# Patient Record
Sex: Female | Born: 1935 | Race: White | Hispanic: Yes | Marital: Single | Smoking: Never smoker
Health system: Southern US, Community
[De-identification: ages and names within clinical notes are randomized; demographics above are authoritative.]

## PROBLEM LIST (undated history)

## (undated) DIAGNOSIS — Z8719 Personal history of other diseases of the digestive system: Secondary | ICD-10-CM

## (undated) DIAGNOSIS — H269 Unspecified cataract: Secondary | ICD-10-CM

## (undated) DIAGNOSIS — R51 Headache: Secondary | ICD-10-CM

## (undated) DIAGNOSIS — N289 Disorder of kidney and ureter, unspecified: Secondary | ICD-10-CM

## (undated) DIAGNOSIS — J189 Pneumonia, unspecified organism: Secondary | ICD-10-CM

## (undated) DIAGNOSIS — Z8489 Family history of other specified conditions: Secondary | ICD-10-CM

## (undated) DIAGNOSIS — I639 Cerebral infarction, unspecified: Secondary | ICD-10-CM

## (undated) DIAGNOSIS — J302 Other seasonal allergic rhinitis: Secondary | ICD-10-CM

## (undated) DIAGNOSIS — Z8711 Personal history of peptic ulcer disease: Secondary | ICD-10-CM

## (undated) DIAGNOSIS — N390 Urinary tract infection, site not specified: Secondary | ICD-10-CM

## (undated) DIAGNOSIS — E119 Type 2 diabetes mellitus without complications: Secondary | ICD-10-CM

## (undated) DIAGNOSIS — I10 Essential (primary) hypertension: Secondary | ICD-10-CM

## (undated) DIAGNOSIS — G4730 Sleep apnea, unspecified: Secondary | ICD-10-CM

## (undated) DIAGNOSIS — M1990 Unspecified osteoarthritis, unspecified site: Secondary | ICD-10-CM

## (undated) HISTORY — PX: KNEE SURGERY: SHX244

## (undated) HISTORY — PX: EYE SURGERY: SHX253

## (undated) HISTORY — PX: CHOLECYSTECTOMY: SHX55

## (undated) HISTORY — PX: ABDOMINAL HYSTERECTOMY: SHX81

## (undated) HISTORY — DX: Disorder of kidney and ureter, unspecified: N28.9

## (undated) HISTORY — PX: BLADDER SURGERY: SHX569

## (undated) HISTORY — PX: OVARIAN CYST REMOVAL: SHX89

## (undated) HISTORY — PX: TUBAL LIGATION: SHX77

## (undated) HISTORY — PX: APPENDECTOMY: SHX54

---

## 2007-02-10 ENCOUNTER — Emergency Department (HOSPITAL_COMMUNITY): Admission: EM | Admit: 2007-02-10 | Discharge: 2007-02-10 | Payer: Self-pay | Admitting: Emergency Medicine

## 2007-11-03 ENCOUNTER — Ambulatory Visit (HOSPITAL_COMMUNITY): Admission: RE | Admit: 2007-11-03 | Discharge: 2007-11-03 | Payer: Self-pay | Admitting: Internal Medicine

## 2007-12-21 ENCOUNTER — Encounter: Admission: RE | Admit: 2007-12-21 | Discharge: 2007-12-21 | Payer: Self-pay | Admitting: Internal Medicine

## 2008-03-29 ENCOUNTER — Encounter: Admission: RE | Admit: 2008-03-29 | Discharge: 2008-03-29 | Payer: Self-pay | Admitting: Internal Medicine

## 2008-10-18 ENCOUNTER — Encounter: Admission: RE | Admit: 2008-10-18 | Discharge: 2008-10-18 | Payer: Self-pay | Admitting: Orthopaedic Surgery

## 2008-10-24 ENCOUNTER — Ambulatory Visit (HOSPITAL_BASED_OUTPATIENT_CLINIC_OR_DEPARTMENT_OTHER): Admission: RE | Admit: 2008-10-24 | Discharge: 2008-10-24 | Payer: Self-pay | Admitting: Orthopaedic Surgery

## 2008-11-22 ENCOUNTER — Ambulatory Visit (HOSPITAL_COMMUNITY): Admission: RE | Admit: 2008-11-22 | Discharge: 2008-11-22 | Payer: Self-pay | Admitting: Internal Medicine

## 2009-12-27 ENCOUNTER — Ambulatory Visit (HOSPITAL_COMMUNITY): Admission: RE | Admit: 2009-12-27 | Discharge: 2009-12-27 | Payer: Self-pay | Admitting: Internal Medicine

## 2010-03-06 ENCOUNTER — Emergency Department (HOSPITAL_COMMUNITY): Admission: EM | Admit: 2010-03-06 | Discharge: 2010-03-06 | Payer: Self-pay | Admitting: Emergency Medicine

## 2010-10-04 LAB — GLUCOSE, CAPILLARY: Glucose-Capillary: 189 mg/dL — ABNORMAL HIGH (ref 70–99)

## 2010-10-30 LAB — GLUCOSE, CAPILLARY
Glucose-Capillary: 106 mg/dL — ABNORMAL HIGH (ref 70–99)
Glucose-Capillary: 107 mg/dL — ABNORMAL HIGH (ref 70–99)

## 2010-10-30 LAB — POCT HEMOGLOBIN-HEMACUE: Hemoglobin: 12.1 g/dL (ref 12.0–15.0)

## 2010-10-31 LAB — BASIC METABOLIC PANEL
BUN: 19 mg/dL (ref 6–23)
CO2: 25 mEq/L (ref 19–32)
Calcium: 9.7 mg/dL (ref 8.4–10.5)
Chloride: 105 mEq/L (ref 96–112)
Creatinine, Ser: 0.82 mg/dL (ref 0.4–1.2)
GFR calc Af Amer: >60 mL/min (ref >60)
GFR calc non Af Amer: >60 mL/min (ref >60)
Glucose, Bld: 111 mg/dL — ABNORMAL HIGH (ref 70–99)
Potassium: 4.7 mEq/L (ref 3.5–5.1)
Sodium: 139 mEq/L (ref 135–145)

## 2010-12-03 NOTE — Op Note (Signed)
Casey Liu, Casey Liu      ACCOUNT NO.:  [id number]   MEDICAL RECORD NO.:  [id number]          PATIENT TYPE:  AMB   LOCATION:  DSC                          FACILITY:  [facility]   PHYSICIAN:  Lubertha Basque. Dalldorf, M.D.DATE OF BIRTH:  1934-12-16   DATE OF PROCEDURE:  10/24/2008  DATE OF DISCHARGE:                               OPERATIVE REPORT   PREOPERATIVE DIAGNOSES:  1. Left knee torn medial and torn lateral meniscus.  2. Left knee chondromalacia.   POSTOPERATIVE DIAGNOSES:  1. Left knee torn medial and torn lateral meniscus.  2. Left knee chondromalacia.   PROCEDURES:  1. Left knee partial medial meniscectomy and partial lateral      meniscectomy.  2. Left knee abrasion chondroplasty, patellofemoral.   ANESTHESIA:  General.   ATTENDING SURGEON:  Lubertha Basque. Dalldorf, MD   INDICATIONS FOR PROCEDURE:  The patient is a 76 year old woman with long  history of bilateral knee pain.  On the right, she is known to have  advanced degenerative change by x-ray, but on the left the change  appears mild at worse.  She has failed injections of cortisone and  viscosupplementative agents on the left as well as oral anti-  inflammatories.  She has pain which limits her ability to rest and walk  and she is offered an arthroscopy.  Informed operative consent was  obtained after discussion of possible complications including reaction  to anesthesia and infection.   SUMMARY/FINDINGS/PROCEDURE:  Under general anesthesia, an arthroscopy of  the left knee was performed.  Suprapatellar pouch was benign while the  patellofemoral joint exhibited grade 3 and focal grade 4 change.  An  abrasion chondroplasty was done in some small areas and thorough  chondroplasty was done across broad areas.  Medial compartment exhibited  torn medial meniscus as expected and a partial medial meniscectomy was  done with basket and shaver removing about 10% of this structure back to  a stable rim.  Unfortunately, she  had a one-half dime sized area of bare  bone on the tibial plateau directly below this torn piece of meniscus.  The ACL was intact and the lateral compartment exhibited a degenerative  tear of the central portion addressed with a tiny partial lateral  meniscectomy.  There were minimal degenerative changes laterally.   DESCRIPTION OF PROCEDURE:  The patient was taken to the operating suite  where general anesthetic was applied without difficulty.  She was  positioned supine and prepped and draped in normal sterile fashion.  After the administration of IV Kefzol, an arthroscopy of the left knee  was performed through a total of two portals.  Findings were as noted  above and procedure consisted of a partial medial and partial lateral  meniscectomy done with basket and shaver.  We then performed the  abrasion chondroplasty patellofemoral.  The knee was thoroughly  irrigated followed by placement of Marcaine with epinephrine and  morphine plus Depo-Medrol.  Adaptic was placed over the portals followed  by dry gauze and loose Ace wrap.  Estimated blood loss and  intraoperative fluids can be obtained from anesthesia records.   DISPOSITION:  The patient was extubated in the  operating room and taken  to the recovery room in stable addition.  She was to go home same-day  and follow up in the office closely.  I will contact her by phone  tonight.      Lubertha Basque Jerl Santos, M.D.  Electronically Signed     PGD/MEDQ  D:  10/24/2008  T:  10/25/2008  Job:  [id number]

## 2011-01-20 ENCOUNTER — Other Ambulatory Visit (HOSPITAL_COMMUNITY): Payer: Self-pay | Admitting: Internal Medicine

## 2011-01-20 DIAGNOSIS — Z1231 Encounter for screening mammogram for malignant neoplasm of breast: Secondary | ICD-10-CM

## 2011-01-29 ENCOUNTER — Ambulatory Visit (HOSPITAL_COMMUNITY)
Admission: RE | Admit: 2011-01-29 | Discharge: 2011-01-29 | Disposition: A | Discharge status: Home / Self Care | Payer: Medicare Other | Source: Ambulatory Visit | Attending: Internal Medicine | Admitting: Internal Medicine

## 2011-01-29 DIAGNOSIS — Z1231 Encounter for screening mammogram for malignant neoplasm of breast: Secondary | ICD-10-CM | POA: Insufficient documentation

## 2011-05-05 LAB — I-STAT 8, (EC8 V) (CONVERTED LAB)
Acid-base deficit: 1
BUN: 16
Bicarbonate: 24
Chloride: 108
Glucose, Bld: 96
HCT: 42
Hemoglobin: 14.3
Operator id: 285491
Potassium: 4.3
Sodium: 139
TCO2: 25
pCO2, Ven: 39 — ABNORMAL LOW
pH, Ven: 7.398 — ABNORMAL HIGH

## 2011-05-05 LAB — BASIC METABOLIC PANEL
BUN: 12
CO2: 26
Calcium: 9.3
Chloride: 104
Creatinine, Ser: 0.7
GFR calc Af Amer: >60
GFR calc non Af Amer: >60
Glucose, Bld: 91
Potassium: 4.1
Sodium: 138

## 2011-05-05 LAB — POCT CARDIAC MARKERS
CKMB, poc: <1 — ABNORMAL LOW
CKMB, poc: <1 — ABNORMAL LOW
CKMB, poc: <1 — ABNORMAL LOW
Myoglobin, poc: 41.2
Myoglobin, poc: 50.5
Myoglobin, poc: 54.9
Operator id: 285491
Operator id: 285841
Operator id: 285841
Troponin i, poc: <0.05
Troponin i, poc: <0.05
Troponin i, poc: <0.05

## 2011-05-05 LAB — POCT I-STAT CREATININE
Creatinine, Ser: 0.7
Operator id: 285491

## 2011-05-05 LAB — D-DIMER, QUANTITATIVE: D-Dimer, Quant: 0.24

## 2012-03-03 ENCOUNTER — Other Ambulatory Visit (HOSPITAL_COMMUNITY): Payer: Self-pay | Admitting: Internal Medicine

## 2012-03-03 DIAGNOSIS — Z1231 Encounter for screening mammogram for malignant neoplasm of breast: Secondary | ICD-10-CM

## 2012-03-19 ENCOUNTER — Ambulatory Visit (HOSPITAL_COMMUNITY)
Admission: RE | Admit: 2012-03-19 | Discharge: 2012-03-19 | Disposition: A | Discharge status: Home / Self Care | Payer: Medicare Other | Source: Ambulatory Visit | Attending: Internal Medicine | Admitting: Internal Medicine

## 2012-03-19 DIAGNOSIS — Z1231 Encounter for screening mammogram for malignant neoplasm of breast: Secondary | ICD-10-CM | POA: Insufficient documentation

## 2012-05-19 ENCOUNTER — Observation Stay (HOSPITAL_COMMUNITY): Payer: Medicare Other

## 2012-05-19 ENCOUNTER — Encounter (HOSPITAL_COMMUNITY): Payer: Self-pay

## 2012-05-19 ENCOUNTER — Observation Stay (HOSPITAL_COMMUNITY)
Admission: EM | Admit: 2012-05-19 | Discharge: 2012-05-19 | Disposition: A | Discharge status: Home / Self Care | Payer: Medicare Other | Attending: Emergency Medicine | Admitting: Emergency Medicine

## 2012-05-19 DIAGNOSIS — R1012 Left upper quadrant pain: Principal | ICD-10-CM | POA: Insufficient documentation

## 2012-05-19 DIAGNOSIS — Z79899 Other long term (current) drug therapy: Secondary | ICD-10-CM | POA: Insufficient documentation

## 2012-05-19 DIAGNOSIS — R109 Unspecified abdominal pain: Secondary | ICD-10-CM

## 2012-05-19 DIAGNOSIS — R112 Nausea with vomiting, unspecified: Secondary | ICD-10-CM | POA: Insufficient documentation

## 2012-05-19 DIAGNOSIS — N390 Urinary tract infection, site not specified: Secondary | ICD-10-CM

## 2012-05-19 DIAGNOSIS — R197 Diarrhea, unspecified: Secondary | ICD-10-CM | POA: Insufficient documentation

## 2012-05-19 LAB — CBC WITH DIFFERENTIAL/PLATELET
Basophils Absolute: 0 10*3/uL (ref 0.0–0.1)
Basophils Relative: 1 % (ref 0–1)
Eosinophils Absolute: 0.1 10*3/uL (ref 0.0–0.7)
Eosinophils Relative: 2 % (ref 0–5)
HCT: 42.2 % (ref 36.0–46.0)
Hemoglobin: 13.5 g/dL (ref 12.0–15.0)
Lymphocytes Relative: 33 % (ref 12–46)
Lymphs Abs: 2.6 10*3/uL (ref 0.7–4.0)
MCH: 28.1 pg (ref 26.0–34.0)
MCHC: 32 g/dL (ref 30.0–36.0)
MCV: 87.9 fL (ref 78.0–100.0)
Monocytes Absolute: 0.7 10*3/uL (ref 0.1–1.0)
Monocytes Relative: 9 % (ref 3–12)
Neutro Abs: 4.3 10*3/uL (ref 1.7–7.7)
Neutrophils Relative %: 56 % (ref 43–77)
Platelets: 263 10*3/uL (ref 150–400)
RBC: 4.8 MIL/uL (ref 3.87–5.11)
RDW: 14.5 % (ref 11.5–15.5)
WBC: 7.8 10*3/uL (ref 4.0–10.5)

## 2012-05-19 LAB — POCT I-STAT TROPONIN I: Troponin i, poc: 0 ng/mL (ref 0.00–0.08)

## 2012-05-19 LAB — COMPREHENSIVE METABOLIC PANEL
ALT: 7 U/L (ref 0–35)
AST: 14 U/L (ref 0–37)
Albumin: 3.6 g/dL (ref 3.5–5.2)
Alkaline Phosphatase: 106 U/L (ref 39–117)
BUN: 17 mg/dL (ref 6–23)
CO2: 24 mEq/L (ref 19–32)
Calcium: 9.7 mg/dL (ref 8.4–10.5)
Chloride: 107 mEq/L (ref 96–112)
Creatinine, Ser: 0.79 mg/dL (ref 0.50–1.10)
GFR calc Af Amer: >90 mL/min (ref >90)
GFR calc non Af Amer: 79 mL/min — ABNORMAL LOW (ref >90)
Glucose, Bld: 108 mg/dL — ABNORMAL HIGH (ref 70–99)
Potassium: 4.1 mEq/L (ref 3.5–5.1)
Sodium: 141 mEq/L (ref 135–145)
Total Bilirubin: 0.3 mg/dL (ref 0.3–1.2)
Total Protein: 7.9 g/dL (ref 6.0–8.3)

## 2012-05-19 LAB — URINE MICROSCOPIC-ADD ON

## 2012-05-19 LAB — URINALYSIS, ROUTINE W REFLEX MICROSCOPIC
Glucose, UA: NEGATIVE mg/dL
Hgb urine dipstick: NEGATIVE
Ketones, ur: NEGATIVE mg/dL
Nitrite: NEGATIVE
Protein, ur: 30 mg/dL — AB
Specific Gravity, Urine: 1.03 (ref 1.005–1.030)
Urobilinogen, UA: 0.2 mg/dL (ref 0.0–1.0)
pH: 5.5 (ref 5.0–8.0)

## 2012-05-19 LAB — LACTIC ACID, PLASMA: Lactic Acid, Venous: 1.4 mmol/L (ref 0.5–2.2)

## 2012-05-19 LAB — LIPASE, BLOOD: Lipase: 17 U/L (ref 11–59)

## 2012-05-19 MED ORDER — ONDANSETRON HCL 4 MG/2ML IJ SOLN
4.0000 mg | Freq: Once | INTRAMUSCULAR | Status: AC
  Administered 2012-05-19: 4 mg via INTRAVENOUS
  Filled 2012-05-19: qty 2

## 2012-05-19 MED ORDER — IOHEXOL 300 MG/ML  SOLN
80.0000 mL | Freq: Once | INTRAMUSCULAR | Status: AC | PRN
  Administered 2012-05-19: 80 mL via INTRAVENOUS

## 2012-05-19 MED ORDER — LIDOCAINE HCL (PF) 1 % IJ SOLN
INTRAMUSCULAR | Status: AC
  Administered 2012-05-19: 2 mL
  Filled 2012-05-19: qty 5

## 2012-05-19 MED ORDER — CEFTRIAXONE SODIUM 1 G IJ SOLR
1.0000 g | Freq: Once | INTRAMUSCULAR | Status: AC
  Administered 2012-05-19: 1 g via INTRAMUSCULAR
  Filled 2012-05-19: qty 10

## 2012-05-19 MED ORDER — MORPHINE SULFATE 4 MG/ML IJ SOLN
4.0000 mg | Freq: Once | INTRAMUSCULAR | Status: AC
  Administered 2012-05-19: 4 mg via INTRAVENOUS
  Filled 2012-05-19: qty 1

## 2012-05-19 MED ORDER — CEPHALEXIN 500 MG PO CAPS: 500.0000 mg | ORAL_CAPSULE | Freq: Four times a day (QID) | ORAL | Status: DC

## 2012-05-19 MED ORDER — IOHEXOL 300 MG/ML  SOLN
20.0000 mL | INTRAMUSCULAR | Status: AC
  Administered 2012-05-19 (×2): 20 mL via ORAL

## 2012-05-19 MED ORDER — SODIUM CHLORIDE 0.9 % IV BOLUS (SEPSIS)
500.0000 mL | Freq: Once | INTRAVENOUS | Status: AC
  Administered 2012-05-19: 12:00:00 via INTRAVENOUS

## 2012-05-19 NOTE — ED Notes (Signed)
Patient has provided urine sample if needed. 

## 2012-05-19 NOTE — ED Provider Notes (Signed)
Medical screening examination/treatment/procedure(s) were performed by non-physician practitioner and as supervising physician I was immediately available for consultation/collaboration.  Magdalena Skilton R. Ilana Prezioso, MD 05/19/12 1640 

## 2012-05-19 NOTE — ED Notes (Signed)
Pt c/o midsternal CP 7/10, reports she has been having it on and off for a few days, pt thought it was her bra, she took it off and was still experiencing CP. Cyndia Skeeters., PA notified, new EKG done.

## 2012-05-19 NOTE — ED Provider Notes (Signed)
Medical screening examination/treatment/procedure(s) were performed by non-physician practitioner and as supervising physician I was immediately available for consultation/collaboration.  Daisuke Bailey R. Galan Ghee, MD 05/19/12 1640 

## 2012-05-19 NOTE — ED Provider Notes (Signed)
Patient placed in the CDU for complaints of abdominal pain with a CT scan pending. Patient urinalysis with evidence of urinary tract infection. Other Labs unremarkable. Patient states she feels much better and her pain is almost gone.   On exam: hemodynamically stable, NAD, heart w/ RRR, lungs CTAB, mild abdominal tenderness in the left upper quadrant, no peripheral edema or calf tenderness.  Patient now complaining of chest pain. ECG remains nonischemic. I-STAT troponin drawn.  On exam: She remained hemodynamic stable no apparent distress heart with regular rate and rhythm, lungs clear to auscultation bilaterally.    ECG:  Date: 05/19/2012  Rate: 67  Rhythm: normal sinus rhythm  QRS Axis: normal  Intervals: normal  ST/T Wave abnormalities: nonspecific ST changes  Conduction Disutrbances:none  Narrative Interpretation: T wave flattening in V2-V6; non-ischemic ECG  Old EKG Reviewed: unchanged  Reevaluation patient resting comfortably. She states she feels much better. Her chest pain is resolved. Her abdominal pain is rated at 3/10.  Abdominal CT scan pending.  Dahlia Client Usbaldo Pannone, PA-C 05/19/12 1524

## 2012-05-19 NOTE — ED Provider Notes (Signed)
History     CSN: [id number]  Arrival date & time 05/19/12  1020   First MD Initiated Contact with Patient 05/19/12 1040      Chief Complaint  Patient presents with  . Abdominal Pain    (Consider location/radiation/quality/duration/timing/severity/associated sxs/prior treatment) HPI  Casey Liu is a 77 y.o. female accompanied by her daughter who translates complaining of 2 weeks of colicky left upper quadrant pain associated with nausea and 2 episodes of nonbloody nonbilious vomiting one last Sunday and one yesterday. Patient denies fever, diarrhea, dysuria. Patient has appendectomy and cholecystectomy previously.   No past medical history on file.  No past surgical history on file.  No family history on file.  History  Substance Use Topics  . Smoking status: Not on file  . Smokeless tobacco: Not on file  . Alcohol Use: Not on file    OB History    Grav Para Term Preterm Abortions TAB SAB Ect Mult Living                  Review of Systems  Constitutional: Negative for fever.  Respiratory: Negative for shortness of breath.   Cardiovascular: Negative for chest pain.  Gastrointestinal: Positive for vomiting, abdominal pain and diarrhea. Negative for nausea.  All other systems reviewed and are negative.    Allergies  Celebrex and Latex  Home Medications   Current Outpatient Rx  Name Route Sig Dispense Refill  . ATENOLOL 100 MG PO TABS Oral Take 100 mg by mouth daily.    Marland Kitchen FLUTICASONE PROPIONATE 50 MCG/ACT NA SUSP Nasal Place 2 sprays into the nose daily.    Marland Kitchen LISINOPRIL-HYDROCHLOROTHIAZIDE 20-12.5 MG PO TABS Oral Take 1 tablet by mouth daily.    Marland Kitchen METFORMIN HCL 500 MG PO TABS Oral Take 500 mg by mouth 2 (two) times daily with a meal.    . NIFEDIPINE ER 90 MG PO TB24 Oral Take 90 mg by mouth daily.    Frazier Butt ULTRA OP Both Eyes Place 1 drop into both eyes every hour as needed. For dry eyes.    . TRAMADOL HCL 50 MG PO TABS Oral Take 50 mg by mouth  every 6 (six) hours as needed. For pain.      BP 155/70  Pulse 64  Temp 97.5 F (36.4 C) (Oral)  Resp 18  SpO2 95%  Physical Exam  Nursing note and vitals reviewed. Constitutional: She is oriented to person, place, and time. She appears well-developed and well-nourished. No distress.  HENT:  Head: Normocephalic.  Mouth/Throat: Oropharynx is clear and moist.  Eyes: Conjunctivae normal and EOM are normal.  Cardiovascular: Normal rate.   Pulmonary/Chest: Effort normal. No stridor.  Abdominal: Soft. Bowel sounds are normal. She exhibits no distension and no mass. There is tenderness. There is no rebound and no guarding.       Tenderness to light palpation of left upper quadrant with no guarding or rebound.  Musculoskeletal: Normal range of motion.  Neurological: She is alert and oriented to person, place, and time.  Skin: Skin is warm.  Psychiatric: She has a normal mood and affect.    ED Course  Procedures (including critical care time)  Labs Reviewed  COMPREHENSIVE METABOLIC PANEL - Abnormal; Notable for the following:    Glucose, Bld 108 (*)     GFR calc non Af Amer 79 (*)     All other components within normal limits  URINALYSIS, ROUTINE W REFLEX MICROSCOPIC - Abnormal; Notable for the following:  Color, Urine AMBER (*)  BIOCHEMICALS MAY BE AFFECTED BY COLOR   APPearance HAZY (*)     Bilirubin Urine SMALL (*)     Protein, ur 30 (*)     Leukocytes, UA MODERATE (*)     All other components within normal limits  URINE MICROSCOPIC-ADD ON - Abnormal; Notable for the following:    Crystals CA OXALATE CRYSTALS (*)     All other components within normal limits  CBC WITH DIFFERENTIAL  LIPASE, BLOOD  LACTIC ACID, PLASMA  URINE CULTURE   No results found.   1. UTI (lower urinary tract infection)       MDM   Mild epigastric and left upper quadrant abdominal pain for 2 weeks with 2 episodes of vomiting. Abdominal exam is benign with mild tenderness to palpation of the  epigastrium no rebound or guarding blood work is normal and lactic acid is unremarkable. Urinalysis shows moderate leukocytes I will send a urine culture and give her a gram of Rocephin via IV. CT abdomen pelvis is pending.  Patient moved to CDU case signed out to PA Muthersbaugh. Plan is to DC if CT is negative with Keflex for the UTI and pain and nausea control medication with close followup by her primary care Dr. in the next 24-48 hours.Joni Reining Easter Kennebrew, PA-C 05/19/12 1313

## 2012-05-19 NOTE — ED Notes (Signed)
Pt has returned from ct 

## 2012-05-19 NOTE — ED Notes (Signed)
Pt here for abd pain, onset yesterday and Sunday with some vomiting, pain increases with palpation.

## 2012-05-19 NOTE — ED Notes (Signed)
Family at bedside. 

## 2012-05-19 NOTE — ED Provider Notes (Signed)
77 year old female in the CDU for abdominal pain. She was awaiting a CT scan which did not show any acute reason for her left upper quadrant abdominal pain. I discussed CT results with patient and her daughter and son-in-law who are translating for her. They state their understanding. Patient currently states her pain is 2/10 and she is feeling much better. Her labs are unremarkable. I will treat her urinary tract infection with Keflex. On exam she is resting comfortably in bed in no apparent distress. Heart RRR. Lungs CTA. Abdomen is soft with normal bowel sounds. Very mild tenderness in left upper quadrant without guarding or any peritoneal signs. AAO x3. She is stable for discharge. I advised followup with her primary care physician in one week. Patient, daughter and son-in-law state their understanding of plan and are agreeable.  Ct Abdomen Pelvis W Contrast  05/19/2012  *RADIOLOGY REPORT*  Clinical Data: Left upper quadrant pain.  History partial hysterectomy.  CT ABDOMEN AND PELVIS WITH CONTRAST  Technique:  Multidetector CT imaging of the abdomen and pelvis was performed following the standard protocol during bolus administration of intravenous contrast.  Contrast: 80mL OMNIPAQUE IOHEXOL 300 MG/ML  SOLN  Comparison: None.  Findings: Lung bases:  Mild bibasilar atelectasis with a subpleural opacity in the right middle lobe on image 3, also likely an area of atelectasis.  Mild cardiomegaly, without pericardial or pleural effusion.  Small hiatal hernia.  Abdomen/pelvis:  The caudate and lateral segment left liver lobes are mildly prominent. No focal liver lesion.  Normal spleen, without perisplenic fluid.  Underdistended proximal stomach.  Apparent wall thickening which is likely secondary.  Partially fatty replaced pancreas.  Cholecystectomy without biliary ductal dilatation.  A tiny right adrenal myelolipoma at 7 mm on image 18.  Normal left adrenal gland and bilateral kidneys.  No retroperitoneal or  retrocrural adenopathy.  Extensive colonic diverticulosis. Normal terminal ileum.  Appendix not visualized, and likely surgically absent.  No right lower quadrant inflammation.  There is mildly increased density within the jejunal mesenteric fat, including on image 33.  Small nodes identified within.  Small bowel otherwise normal. No ascites.  No pelvic adenopathy.  Normal urinary bladder.  Pessary in place. No adnexal mass or significant free fluid.  Bones/Musculoskeletal:  No acute osseous abnormality.  IMPRESSION:  1. No acute process or explanation for left upper quadrant pain. 2.  Atypical hepatic morphology, without specific evidence of cirrhosis.  Correlate with risk factors to exclude early / mild cirrhosis. 3.  Right adrenal 7 mm myelolipoma.   Original Report Authenticated By: Consuello Bossier, M.D.     Trevor Mace, PA-C 05/19/12 [phone]

## 2012-05-20 LAB — URINE CULTURE: Colony Count: 7000

## 2012-05-20 NOTE — ED Provider Notes (Signed)
Medical screening examination/treatment/procedure(s) were performed by non-physician practitioner and as supervising physician I was immediately available for consultation/collaboration.   Lyanne Co, MD 05/20/12 [phone]

## 2012-06-14 ENCOUNTER — Other Ambulatory Visit: Payer: Self-pay | Admitting: Ophthalmology

## 2012-06-14 NOTE — H&P (Signed)
Pre-operative History and Physical for Ophthalmic Surgery  Casey Liu 06/14/2012                  Chief Complaint: Decreased vision  Diagnosis: Nuclear Sclerotic Cataract Right [name]  Allergies  Allergen Reactions  . Celebrex (Celecoxib) Nausea And Vomiting and Palpitations  . Latex Rash    Prior to Admission medications   Medication Sig Start Date End Date Taking? Authorizing Provider  atenolol (TENORMIN) 100 MG tablet Take 100 mg by mouth daily.    Historical Provider, MD  cephALEXin (KEFLEX) 500 MG capsule Take 1 capsule (500 mg total) by mouth 4 (four) times daily. 05/19/12   Robyn M Albert, PA-C  fluticasone (FLONASE) 50 MCG/ACT nasal spray Place 2 sprays into the nose daily.    Historical Provider, MD  lisinopril-hydrochlorothiazide (PRINZIDE,ZESTORETIC) 20-12.5 MG per tablet Take 1 tablet by mouth daily.    Historical Provider, MD  metFORMIN (GLUCOPHAGE) 500 MG tablet Take 500 mg by mouth 2 (two) times daily with a meal.    Historical Provider, MD  NIFEdipine (ADALAT CC) 90 MG 24 hr tablet Take 90 mg by mouth daily.    Historical Provider, MD  Polyethyl Glycol-Propyl Glycol (SYSTANE ULTRA OP) Place 1 drop into both eyes every hour as needed. For dry eyes.    Historical Provider, MD  traMADol (ULTRAM) 50 MG tablet Take 50 mg by mouth every 6 (six) hours as needed. For pain.    Historical Provider, MD    Planned Procedure:                                       Phacoemulsification, Posterior Chamber Intra-ocular Lens Right Eye                                       Acrysof MA50BM + 21.00 Diopter PC IOL for implant OD  There were no vitals filed for this visit.  Pulse: 76         Temp: NE        Resp:  18       ROS: noncontributory  No past medical history on file.  No past surgical history on file.   History   Social History  . Marital Status: Single    Spouse Name: N/A    Number of Children: N/A  . Years of Education: N/A   Occupational History  .  Not on file.   Social History Main Topics  . Smoking status: Not on file  . Smokeless tobacco: Not on file  . Alcohol Use: Not on file  . Drug Use: Not on file  . Sexually Active: Not on file   Other Topics Concern  . Not on file   Social History Narrative  . No narrative on file     The following examination is for anesthesia clearance for minimally invasive Ophthalmic surgery. It is primarily to document heart and lung findings and is not intended to elucidate unknown general medical conditions inclusive of abdominal masses, lung lesions, etc.   General Constitution:  within normal limits   Alertness/Orientation:  Person, time place     yes   HEENT:  Eye Findings: Nuclear Sclerotic Cataract                 right eye  Neck: supple   without masses  Chest/Lungs: clear to auscultation  Cardiac: Normal S1 and S2 without Murmur, S3 or S4  Neuro: non-focal  Impression: Nuclear Sclerotic Cataract OD  Planned Procedure:  Phacoemulsification, Posterior Chamber Intraocular Lens OD    Syniyah Bourne, MD        

## 2012-07-05 ENCOUNTER — Ambulatory Visit (HOSPITAL_COMMUNITY)
Admission: RE | Admit: 2012-07-05 | Discharge: 2012-07-05 | Disposition: A | Discharge status: Home / Self Care | Payer: Medicare Other | Source: Ambulatory Visit | Attending: Anesthesiology | Admitting: Anesthesiology

## 2012-07-05 ENCOUNTER — Encounter (HOSPITAL_COMMUNITY): Payer: Self-pay

## 2012-07-05 ENCOUNTER — Encounter (HOSPITAL_COMMUNITY)
Admission: RE | Admit: 2012-07-05 | Discharge: 2012-07-05 | Disposition: A | Discharge status: Home / Self Care | Payer: Medicare Other | Source: Ambulatory Visit | Attending: Ophthalmology | Admitting: Ophthalmology

## 2012-07-05 DIAGNOSIS — H269 Unspecified cataract: Secondary | ICD-10-CM | POA: Insufficient documentation

## 2012-07-05 DIAGNOSIS — Z01818 Encounter for other preprocedural examination: Secondary | ICD-10-CM | POA: Insufficient documentation

## 2012-07-05 HISTORY — DX: Unspecified cataract: H26.9

## 2012-07-05 HISTORY — DX: Type 2 diabetes mellitus without complications: E11.9

## 2012-07-05 HISTORY — DX: Essential (primary) hypertension: I10

## 2012-07-05 HISTORY — DX: Family history of other specified conditions: Z84.89

## 2012-07-05 HISTORY — DX: Personal history of other diseases of the digestive system: Z87.19

## 2012-07-05 HISTORY — DX: Pneumonia, unspecified organism: J18.9

## 2012-07-05 HISTORY — DX: Other seasonal allergic rhinitis: J30.2

## 2012-07-05 HISTORY — DX: Unspecified osteoarthritis, unspecified site: M19.90

## 2012-07-05 HISTORY — DX: Urinary tract infection, site not specified: N39.0

## 2012-07-05 HISTORY — DX: Headache: R51

## 2012-07-05 HISTORY — DX: Personal history of peptic ulcer disease: Z87.11

## 2012-07-05 LAB — BASIC METABOLIC PANEL
BUN: 20 mg/dL (ref 6–23)
CO2: 23 mEq/L (ref 19–32)
Calcium: 9.9 mg/dL (ref 8.4–10.5)
Chloride: 105 mEq/L (ref 96–112)
Creatinine, Ser: 0.69 mg/dL (ref 0.50–1.10)
GFR calc Af Amer: >90 mL/min (ref >90)
GFR calc non Af Amer: 83 mL/min — ABNORMAL LOW (ref >90)
Glucose, Bld: 88 mg/dL (ref 70–99)
Potassium: 4.2 mEq/L (ref 3.5–5.1)
Sodium: 139 mEq/L (ref 135–145)

## 2012-07-05 LAB — CBC
HCT: 44.1 % (ref 36.0–46.0)
Hemoglobin: 13.8 g/dL (ref 12.0–15.0)
MCH: 27.9 pg (ref 26.0–34.0)
MCHC: 31.3 g/dL (ref 30.0–36.0)
MCV: 89.1 fL (ref 78.0–100.0)
Platelets: 255 10*3/uL (ref 150–400)
RBC: 4.95 MIL/uL (ref 3.87–5.11)
RDW: 15.2 % (ref 11.5–15.5)
WBC: 7.9 10*3/uL (ref 4.0–10.5)

## 2012-07-05 NOTE — Pre-Procedure Instructions (Signed)
20 Casey Liu  07/05/2012   Your procedure is scheduled on:  Wed, Dec 18 @ 1:30 PM  Report to [facility] at 11:30 AM.  Call this number if you have problems the morning of surgery: [phone]   Remember:   Do not eat food:After Midnight.    Take these medicines the morning of surgery with A SIP OF WATER: Atenolol(Tenormin),Keflex(Cephalexin),Flonase(Fluticasone),Nifedipine(Adalat),and Tramadol(Ultram-if needed)   Do not wear jewelry, make-up or nail polish.  Do not wear lotions, powders, or perfumes. You may wear deodorant.  Do not shave 48 hours prior to surgery.  Do not bring valuables to the hospital.  Contacts, dentures or bridgework may not be worn into surgery.  Leave suitcase in the car. After surgery it may be brought to your room.  For patients admitted to the hospital, checkout time is 11:00 AM the day of discharge.   Patients discharged the day of surgery will not be allowed to drive home.  Special Instructions: Shower using CHG 2 nights before surgery and the night before surgery.  If you shower the day of surgery use CHG.  Use special wash - you have one bottle of CHG for all showers.  You should use approximately 1/3 of the bottle for each shower.   Please read over the following fact sheets that you were given: Pain Booklet, Coughing and Deep Breathing and Surgical Site Infection Prevention

## 2012-07-05 NOTE — Progress Notes (Signed)
Patient states has never been told she was allergic to latex.

## 2012-07-05 NOTE — Progress Notes (Signed)
Contacted Dr. Maxwell Caul office, spoke with Tresa Endo, requested last office visit note and most recent H & P.

## 2012-07-05 NOTE — Progress Notes (Addendum)
Patient sees doctor with [facility] [phone], spoke with Hillary Bow requested last office visit notes, EKG, echo/stress, cardiac cath ( if done) and sleep study.

## 2012-07-06 MED ORDER — GATIFLOXACIN 0.5 % OP SOLN
1.0000 [drp] | OPHTHALMIC | Status: AC | PRN
  Administered 2012-07-07 (×3): 1 [drp] via OPHTHALMIC
  Filled 2012-07-06: qty 2.5

## 2012-07-06 MED ORDER — PREDNISOLONE ACETATE 1 % OP SUSP
1.0000 [drp] | OPHTHALMIC | Status: AC
  Administered 2012-07-07: 1 [drp] via OPHTHALMIC
  Filled 2012-07-06: qty 5

## 2012-07-06 MED ORDER — PHENYLEPHRINE HCL 2.5 % OP SOLN
1.0000 [drp] | OPHTHALMIC | Status: AC | PRN
  Administered 2012-07-07 (×3): 1 [drp] via OPHTHALMIC
  Filled 2012-07-06: qty 3

## 2012-07-06 MED ORDER — TETRACAINE HCL 0.5 % OP SOLN
2.0000 [drp] | OPHTHALMIC | Status: AC
  Administered 2012-07-07: 2 [drp] via OPHTHALMIC
  Filled 2012-07-06: qty 2

## 2012-07-06 NOTE — Consult Note (Signed)
Anesthesia chart review: Patient is a 77 year old female scheduled for right eye cataract extraction by Dr. Clarisa Kindred on 07/07/2012. History includes nonsmoker, obesity, diabetes mellitus type 2, hypertension, headaches, gastric ulcer, mild to moderate pulmonary HTN by 07/2011 echo.  She denied personal history of anesthetic complications, but said her mother has a history of postoperative MI x3.  PCP is listed as Dr. Vedia Pereyra.  She has been evaluated by Cardiologist Dr. Jacinto Halim for dyspnea in the past, last visit 08/24/11.  He recommended one year follow-up at that time.    Echo on 08/14/11 showed normal LVEF, EF 55-60%, mild to moderate LVH, grade I/IV diastolic dysfunction, RVH, mild TR, mild to moderate pulmonary hypertension.  Her echo was felt stable since at least 02/05/2011.  Lexiscan Sestamibi on 02/07/11 showed normal perfusion without ischemia or scar, normal LVEF.  EKG on 05/19/2012 showed normal sinus rhythm, nonspecific T wave abnormality.  Chest x-ray on 07/05/2012 showed no acute cardiopulmonary findings.  Preoperative labs noted.   If no acute changes in her status then anticipate she can proceed as planned.  Shonna Chock, PA-C 07/06/12 1147

## 2012-07-07 ENCOUNTER — Encounter (HOSPITAL_COMMUNITY): Payer: Self-pay | Admitting: Vascular Surgery

## 2012-07-07 ENCOUNTER — Encounter (HOSPITAL_COMMUNITY)
Admission: RE | Disposition: A | Discharge status: Home / Self Care | Payer: Self-pay | Source: Ambulatory Visit | Attending: Ophthalmology

## 2012-07-07 ENCOUNTER — Encounter (HOSPITAL_COMMUNITY): Payer: Self-pay | Admitting: *Deleted

## 2012-07-07 ENCOUNTER — Ambulatory Visit (HOSPITAL_COMMUNITY): Payer: Medicare Other | Admitting: Vascular Surgery

## 2012-07-07 ENCOUNTER — Ambulatory Visit (HOSPITAL_COMMUNITY)
Admission: RE | Admit: 2012-07-07 | Discharge: 2012-07-07 | Disposition: A | Discharge status: Home / Self Care | Payer: Medicare Other | Source: Ambulatory Visit | Attending: Ophthalmology | Admitting: Ophthalmology

## 2012-07-07 DIAGNOSIS — Z91040 Latex allergy status: Secondary | ICD-10-CM | POA: Insufficient documentation

## 2012-07-07 DIAGNOSIS — H2510 Age-related nuclear cataract, unspecified eye: Secondary | ICD-10-CM | POA: Insufficient documentation

## 2012-07-07 DIAGNOSIS — E119 Type 2 diabetes mellitus without complications: Secondary | ICD-10-CM | POA: Insufficient documentation

## 2012-07-07 DIAGNOSIS — I10 Essential (primary) hypertension: Secondary | ICD-10-CM | POA: Insufficient documentation

## 2012-07-07 HISTORY — PX: CATARACT EXTRACTION W/PHACO: SHX586

## 2012-07-07 LAB — GLUCOSE, CAPILLARY
Glucose-Capillary: 109 mg/dL — ABNORMAL HIGH (ref 70–99)
Glucose-Capillary: 92 mg/dL (ref 70–99)

## 2012-07-07 SURGERY — PHACOEMULSIFICATION, CATARACT, WITH IOL INSERTION
Anesthesia: General | Site: Eye | Laterality: Right | Wound class: Clean

## 2012-07-07 MED ORDER — HYPROMELLOSE (GONIOSCOPIC) 2.5 % OP SOLN
OPHTHALMIC | Status: DC | PRN
  Administered 2012-07-07: 1 [drp] via OPHTHALMIC

## 2012-07-07 MED ORDER — NA CHONDROIT SULF-NA HYALURON 40-30 MG/ML IO SOLN
INTRAOCULAR | Status: AC
  Filled 2012-07-07: qty 0.5

## 2012-07-07 MED ORDER — EPINEPHRINE HCL 1 MG/ML IJ SOLN
INTRAMUSCULAR | Status: AC
  Filled 2012-07-07: qty 1

## 2012-07-07 MED ORDER — PROPOFOL 10 MG/ML IV BOLUS
INTRAVENOUS | Status: DC | PRN
  Administered 2012-07-07: 140 mg via INTRAVENOUS

## 2012-07-07 MED ORDER — BACITRACIN-POLYMYXIN B 500-10000 UNIT/GM OP OINT
TOPICAL_OINTMENT | OPHTHALMIC | Status: DC | PRN
  Administered 2012-07-07: 1 via OPHTHALMIC

## 2012-07-07 MED ORDER — PROVISC 10 MG/ML IO SOLN
INTRAOCULAR | Status: DC | PRN
  Administered 2012-07-07: .85 mL via INTRAOCULAR

## 2012-07-07 MED ORDER — BUPIVACAINE HCL (PF) 0.75 % IJ SOLN
INTRAMUSCULAR | Status: AC
  Filled 2012-07-07: qty 10

## 2012-07-07 MED ORDER — ACETYLCHOLINE CHLORIDE 1:100 IO SOLR
INTRAOCULAR | Status: DC | PRN
  Administered 2012-07-07: 20 mg via INTRAOCULAR

## 2012-07-07 MED ORDER — BUPIVACAINE HCL (PF) 0.75 % IJ SOLN
INTRAMUSCULAR | Status: DC | PRN
  Administered 2012-07-07: 10 mL

## 2012-07-07 MED ORDER — HYPROMELLOSE (GONIOSCOPIC) 2.5 % OP SOLN
OPHTHALMIC | Status: AC
  Filled 2012-07-07: qty 15

## 2012-07-07 MED ORDER — FENTANYL CITRATE 0.05 MG/ML IJ SOLN: 25.0000 ug | INTRAMUSCULAR | Status: DC | PRN

## 2012-07-07 MED ORDER — WATER FOR IRRIGATION, STERILE IR SOLN
Status: DC | PRN
  Administered 2012-07-07: 1000 mL

## 2012-07-07 MED ORDER — ACETYLCHOLINE CHLORIDE 1:100 IO SOLR
INTRAOCULAR | Status: AC
  Filled 2012-07-07: qty 1

## 2012-07-07 MED ORDER — LIDOCAINE HCL (CARDIAC) 20 MG/ML IV SOLN
INTRAVENOUS | Status: DC | PRN
  Administered 2012-07-07: 80 mg via INTRAVENOUS

## 2012-07-07 MED ORDER — BSS IO SOLN
INTRAOCULAR | Status: AC
  Filled 2012-07-07: qty 500

## 2012-07-07 MED ORDER — EPINEPHRINE HCL 1 MG/ML IJ SOLN
INTRAOCULAR | Status: DC | PRN
  Administered 2012-07-07: 14:00:00

## 2012-07-07 MED ORDER — BACITRACIN-POLYMYXIN B 500-10000 UNIT/GM OP OINT
TOPICAL_OINTMENT | OPHTHALMIC | Status: AC
  Filled 2012-07-07: qty 3.5

## 2012-07-07 MED ORDER — SODIUM HYALURONATE 10 MG/ML IO SOLN
INTRAOCULAR | Status: AC
  Filled 2012-07-07: qty 0.85

## 2012-07-07 MED ORDER — DEXAMETHASONE SODIUM PHOSPHATE 10 MG/ML IJ SOLN
INTRAMUSCULAR | Status: AC
  Filled 2012-07-07: qty 1

## 2012-07-07 MED ORDER — ACETAZOLAMIDE SODIUM 500 MG IJ SOLR
INTRAMUSCULAR | Status: AC
  Filled 2012-07-07: qty 500

## 2012-07-07 MED ORDER — LIDOCAINE HCL 2 % IJ SOLN
INTRAMUSCULAR | Status: AC
  Filled 2012-07-07: qty 20

## 2012-07-07 MED ORDER — LACTATED RINGERS IV SOLN
INTRAVENOUS | Status: DC
  Administered 2012-07-07: 13:00:00 via INTRAVENOUS

## 2012-07-07 MED ORDER — CEFAZOLIN SUBCONJUNCTIVAL INJECTION 100 MG/0.5 ML
200.0000 mg | INJECTION | SUBCONJUNCTIVAL | Status: AC
  Administered 2012-07-07: 200 mg via SUBCONJUNCTIVAL
  Filled 2012-07-07: qty 1

## 2012-07-07 MED ORDER — NA CHONDROIT SULF-NA HYALURON 40-30 MG/ML IO SOLN
INTRAOCULAR | Status: DC | PRN
  Administered 2012-07-07: 0.5 mL via INTRAOCULAR

## 2012-07-07 MED ORDER — DEXAMETHASONE SODIUM PHOSPHATE 10 MG/ML IJ SOLN
INTRAMUSCULAR | Status: DC | PRN
  Administered 2012-07-07: 10 mg

## 2012-07-07 MED ORDER — SODIUM CHLORIDE 0.9 % IR SOLN
Status: DC | PRN
  Administered 2012-07-07: 1

## 2012-07-07 SURGICAL SUPPLY — 63 items
APPLICATOR COTTON TIP 6IN STRL (MISCELLANEOUS) ×2 IMPLANT
APPLICATOR DR MATTHEWS STRL (MISCELLANEOUS) ×2 IMPLANT
BAG MINI COLL DRAIN (WOUND CARE) ×2 IMPLANT
BLADE EYE MINI 60D BEAVER (BLADE) IMPLANT
BLADE KERATOME 2.75 (BLADE) ×2 IMPLANT
BLADE STAB KNIFE 15DEG (BLADE) IMPLANT
CANNULA ANTERIOR CHAMBER 27GA (MISCELLANEOUS) IMPLANT
CLOTH BEACON ORANGE TIMEOUT ST (SAFETY) ×2 IMPLANT
DRAPE OPHTHALMIC 77X100 STRL (CUSTOM PROCEDURE TRAY) ×2 IMPLANT
DRAPE POUCH INSTRU U-SHP 10X18 (DRAPES) ×2 IMPLANT
DRSG TEGADERM 4X4.75 (GAUZE/BANDAGES/DRESSINGS) ×2 IMPLANT
FILTER BLUE MILLIPORE (MISCELLANEOUS) IMPLANT
GLOVE BIOGEL PI IND STRL 6 (GLOVE) ×1 IMPLANT
GLOVE BIOGEL PI INDICATOR 6 (GLOVE) ×1
GLOVE SS BIOGEL STRL SZ 6.5 (GLOVE) IMPLANT
GLOVE SUPERSENSE BIOGEL SZ 6.5 (GLOVE)
GLOVE SURG SS PI 6.5 STRL IVOR (GLOVE) ×4 IMPLANT
GLOVE SURG SS PI 7.5 STRL IVOR (GLOVE) ×2 IMPLANT
GOWN SRG XL XLNG 56XLVL 4 (GOWN DISPOSABLE) ×1 IMPLANT
GOWN STRL NON-REIN LRG LVL3 (GOWN DISPOSABLE) ×4 IMPLANT
GOWN STRL NON-REIN XL XLG LVL4 (GOWN DISPOSABLE) ×1
KIT BASIN OR (CUSTOM PROCEDURE TRAY) ×2 IMPLANT
KIT ROOM TURNOVER OR (KITS) IMPLANT
KNIFE GRIESHABER SHARP 2.5MM (MISCELLANEOUS) ×2 IMPLANT
LENS IOL ACRYSOF MP POST 21.0 (Intraocular Lens) ×2 IMPLANT
MASK EYE SHIELD (GAUZE/BANDAGES/DRESSINGS) ×2 IMPLANT
NEEDLE 18GX1X1/2 (RX/OR ONLY) (NEEDLE) IMPLANT
NEEDLE 22X1 1/2 (OR ONLY) (NEEDLE) ×2 IMPLANT
NEEDLE 25GX 5/8IN NON SAFETY (NEEDLE) ×2 IMPLANT
NEEDLE FILTER BLUNT 18X 1/2SAF (NEEDLE) ×1
NEEDLE FILTER BLUNT 18X1 1/2 (NEEDLE) ×1 IMPLANT
NEEDLE HYPO 25GX1X1/2 BEV (NEEDLE) ×2 IMPLANT
NEEDLE HYPO 30X.5 LL (NEEDLE) ×4 IMPLANT
NS IRRIG 1000ML POUR BTL (IV SOLUTION) ×2 IMPLANT
PACK CATARACT CUSTOM (CUSTOM PROCEDURE TRAY) ×2 IMPLANT
PACK CATARACT MCHSCP (PACKS) ×2 IMPLANT
PACK COMBINED CATERACT/VIT 23G (OPHTHALMIC RELATED) IMPLANT
PAD ARMBOARD 7.5X6 YLW CONV (MISCELLANEOUS) ×4 IMPLANT
PAD EYE OVAL STERILE LF (GAUZE/BANDAGES/DRESSINGS) ×2 IMPLANT
PHACO TIP KELMAN 45DEG (TIP) IMPLANT
PROBE ANTERIOR 20G W/INFUS NDL (MISCELLANEOUS) IMPLANT
RING MALYGIN (MISCELLANEOUS) IMPLANT
ROLLS DENTAL (MISCELLANEOUS) ×4 IMPLANT
SHUTTLE MONARCH TYPE A (NEEDLE) ×2 IMPLANT
SOLUTION ANTI FOG 6CC (MISCELLANEOUS) ×2 IMPLANT
SPEAR EYE SURG WECK-CEL (MISCELLANEOUS) ×2 IMPLANT
SUT ETHILON 10-0 CS-B-6CS-B-6 (SUTURE)
SUT ETHILON 5 0 P 3 18 (SUTURE)
SUT ETHILON 9 0 TG140 8 (SUTURE) IMPLANT
SUT NYLON ETHILON 5-0 P-3 1X18 (SUTURE) IMPLANT
SUT PLAIN 6 0 TG1408 (SUTURE) IMPLANT
SUT POLY NON ABSORB 10-0 8 STR (SUTURE) IMPLANT
SUT VICRYL 6 0 S 29 12 (SUTURE) IMPLANT
SUTURE EHLN 10-0 CS-B-6CS-B-6 (SUTURE) IMPLANT
SYR 20CC LL (SYRINGE) IMPLANT
SYR 5ML LL (SYRINGE) IMPLANT
SYR TB 1ML LUER SLIP (SYRINGE) ×2 IMPLANT
SYRINGE 10CC LL (SYRINGE) IMPLANT
TAPE PAPER MEDFIX 1IN X 10YD (GAUZE/BANDAGES/DRESSINGS) ×2 IMPLANT
TIP ABS 45DEG FLARED 0.9MM (TIP) ×2 IMPLANT
TOWEL OR 17X24 6PK STRL BLUE (TOWEL DISPOSABLE) ×4 IMPLANT
WATER STERILE IRR 1000ML POUR (IV SOLUTION) ×2 IMPLANT
WIPE INSTRUMENT VISIWIPE 73X73 (MISCELLANEOUS) ×2 IMPLANT

## 2012-07-07 NOTE — Transfer of Care (Signed)
Immediate Anesthesia Transfer of Care Note  Patient: Casey Liu  Procedure(s) Performed: Procedure(s) (LRB) with comments: CATARACT EXTRACTION PHACO AND INTRAOCULAR LENS PLACEMENT (IOC) (Right)  Patient Location: PACU  Anesthesia Type:General  Level of Consciousness: awake, alert  and oriented  Airway & Oxygen Therapy: Patient Spontanous Breathing  Post-op Assessment: Report given to PACU RN and Post -op Vital signs reviewed and stable  Post vital signs: Reviewed and stable  Complications: No apparent anesthesia complications

## 2012-07-07 NOTE — Interval H&P Note (Signed)
History and Physical Interval Note:  07/07/2012 1:39 PM  Casey Liu  has presented today for surgery, with the diagnosis of Cataract Right Eye  The various methods of treatment have been discussed with the patient and family. After consideration of risks, benefits and other options for treatment, the patient has consented to  Procedure(s) (LRB) with comments: CATARACT EXTRACTION PHACO AND INTRAOCULAR LENS PLACEMENT (IOC) (Right) as a surgical intervention .  The patient's history has been reviewed, patient examined, no change in status, stable for surgery.  I have reviewed the patient's chart and labs.  Questions were answered to the patient's satisfaction.     Shade Flood, MD

## 2012-07-07 NOTE — Anesthesia Postprocedure Evaluation (Signed)
Anesthesia Post Note  Patient: Casey Liu  Procedure(s) Performed: Procedure(s) (LRB): CATARACT EXTRACTION PHACO AND INTRAOCULAR LENS PLACEMENT (IOC) (Right)  Anesthesia type: GA  Patient location: PACU  Post pain: Pain level controlled  Post assessment: Post-op Vital signs reviewed  Last Vitals:  Filed Vitals:   07/07/12 1515  BP: 135/54  Pulse: 77  Temp:   Resp: 16    Post vital signs: Reviewed  Level of consciousness: sedated  Complications: No apparent anesthesia complications

## 2012-07-07 NOTE — H&P (View-Only) (Signed)
Pre-operative History and Physical for Ophthalmic Surgery  Casey Liu 06/14/2012                  Chief Complaint: Decreased vision  Diagnosis: Nuclear Sclerotic Cataract Right [name]  Allergies  Allergen Reactions  . Celebrex (Celecoxib) Nausea And Vomiting and Palpitations  . Latex Rash    Prior to Admission medications   Medication Sig Start Date End Date Taking? Authorizing Provider  atenolol (TENORMIN) 100 MG tablet Take 100 mg by mouth daily.    Historical Provider, MD  cephALEXin (KEFLEX) 500 MG capsule Take 1 capsule (500 mg total) by mouth 4 (four) times daily. 05/19/12   Trevor Mace, PA-C  fluticasone (FLONASE) 50 MCG/ACT nasal spray Place 2 sprays into the nose daily.    Historical Provider, MD  lisinopril-hydrochlorothiazide (PRINZIDE,ZESTORETIC) 20-12.5 MG per tablet Take 1 tablet by mouth daily.    Historical Provider, MD  metFORMIN (GLUCOPHAGE) 500 MG tablet Take 500 mg by mouth 2 (two) times daily with a meal.    Historical Provider, MD  NIFEdipine (ADALAT CC) 90 MG 24 hr tablet Take 90 mg by mouth daily.    Historical Provider, MD  Polyethyl Glycol-Propyl Glycol (SYSTANE ULTRA OP) Place 1 drop into both eyes every hour as needed. For dry eyes.    Historical Provider, MD  traMADol (ULTRAM) 50 MG tablet Take 50 mg by mouth every 6 (six) hours as needed. For pain.    Historical Provider, MD    Planned Procedure:                                       Phacoemulsification, Posterior Chamber Intra-ocular Lens Right Eye                                       Acrysof MA50BM + 21.00 Diopter PC IOL for implant OD  There were no vitals filed for this visit.  Pulse: 76         Temp: NE        Resp:  18       ROS: noncontributory  No past medical history on file.  No past surgical history on file.   History   Social History  . Marital Status: Single    Spouse Name: N/A    Number of Children: N/A  . Years of Education: N/A   Occupational History  .  Not on file.   Social History Main Topics  . Smoking status: Not on file  . Smokeless tobacco: Not on file  . Alcohol Use: Not on file  . Drug Use: Not on file  . Sexually Active: Not on file   Other Topics Concern  . Not on file   Social History Narrative  . No narrative on file     The following examination is for anesthesia clearance for minimally invasive Ophthalmic surgery. It is primarily to document heart and lung findings and is not intended to elucidate unknown general medical conditions inclusive of abdominal masses, lung lesions, etc.   General Constitution:  within normal limits   Alertness/Orientation:  Person, time place     yes   HEENT:  Eye Findings: Nuclear Sclerotic Cataract                 right eye  Neck: supple  without masses  Chest/Lungs: clear to auscultation  Cardiac: Normal S1 and S2 without Murmur, S3 or S4  Neuro: non-focal  Impression: Nuclear Sclerotic Cataract OD  Planned Procedure:  Phacoemulsification, Posterior Chamber Intraocular Lens OD    Shade Flood, MD

## 2012-07-07 NOTE — Progress Notes (Signed)
Spoke to Geneticist, molecular has interpreter scheduled from 11:30- 13:30 will use interpreter to go through preop questions upon their arrival

## 2012-07-07 NOTE — Anesthesia Preprocedure Evaluation (Addendum)
Anesthesia Evaluation  Patient identified by MRN, date of birth, ID band Patient awake    Reviewed: Allergy & Precautions, H&P , Patient's Chart, lab work & pertinent test results, reviewed documented beta blocker date and time   History of Anesthesia Complications (+) Family history of anesthesia reactionNegative for: history of anesthetic complications  Airway Mallampati: II TM Distance: >3 FB Neck ROM: full    Dental No notable dental hx.    Pulmonary neg pulmonary ROS, pneumonia -, resolved,  breath sounds clear to auscultation  Pulmonary exam normal       Cardiovascular Exercise Tolerance: Good hypertension, negative cardio ROS  Rhythm:regular Rate:Normal     Neuro/Psych  Headaches, negative neurological ROS  negative psych ROS   GI/Hepatic negative GI ROS, Neg liver ROS,   Endo/Other  negative endocrine ROSdiabetes  Renal/GU negative Renal ROS     Musculoskeletal   Abdominal   Peds  Hematology negative hematology ROS (+)   Anesthesia Other Findings Family history of anesthesia complication   mother had "three heart attacks after anesthesia" Seasonal allergies        Pneumonia   hx of  Diabetes mellitus without complication        Urinary tract infection   hx of Headache   hx of    Arthritis     History of stomach ulcers        Cataract   right Hypertension   Dr. Mikeal Hawthorne [phone]    Reproductive/Obstetrics negative OB ROS                           Anesthesia Physical Anesthesia Plan  ASA: III  Anesthesia Plan: MAC and General   Post-op Pain Management:    Induction: Intravenous  Airway Management Planned: LMA  Additional Equipment:   Intra-op Plan:   Post-operative Plan:   Informed Consent: I have reviewed the patients History and Physical, chart, labs and discussed the procedure including the risks, benefits and alternatives for the proposed anesthesia with the  patient or authorized representative who has indicated his/her understanding and acceptance.   Dental Advisory Given and Dental advisory given  Plan Discussed with: CRNA, Surgeon and Anesthesiologist  Anesthesia Plan Comments:        Anesthesia Quick Evaluation

## 2012-07-07 NOTE — Op Note (Signed)
Casey Liu 07/07/2012 Cataract: Combined, Nuclear  Procedure: Phacoemulsification, Posterior Chamber Intra-ocular Lens Operative Eye:  right eye  Surgeon: Shade Flood Estimated Blood Loss: minimal Specimens for Pathology:  None Complications: posterior capsule tear  The patient was prepared and draped in the usual manner for ocular surgery on the right eye. A Cook lid speculum was placed. A peripheral clear corneal incision was made at the surgical limbus centered at the 11:00 meridian. A separate clear corneal stab incision was made with a 15 degree blade at the 2:00 meridian to permit bi-manual technique. Viscoat and  Provisc as an underlying layer next to the capsule was instilled into the anterior chamber through that incision.  A keratome was used to create a self sealing incision entering the anterior chamber at the 11:00 meridian. A capsulorhexis was performed using a bent 25g needle. The lens was hydrodissected and the nucleus was hydrodilineated using a Nichammin cannula. The Chang chopper was inserted and used to rotate the lens to insure adequate lens mobility. The phacoemulsification handpiece was inserted and a combined phaco-chop technique was employed, fracturing the lens into separate sections with subsequent removal with the phaco handpiece.   The I/A cannula was used to remove remaining lens cortex. Provisc was instilled and used to deepen the anterior chamber and posterior capsule bag. The Monarch injector was used to place a folded Acrysof MA50BM PC IOL, + 21.00  diopters, into the capsule bag. The bag was noted to develop a radial tear so the lens was placed into the sulcus to prevent posterior dislocation. A Sinskey lens hook was used to dial in the trailing haptic into the sulcus.  The I/A cannula was used to remove the viscoelastic from the anterior chamber. BSS was used to bring IOP to the desired range and the wound was checked to insure it was watertight.  Miochol was used to bring the pupil down. Subconjunctival injections of Ancef 100/0.78ml and Dexamethasone 0.5 ml of a 10mg /39ml solution were placed without complication. The lid speculum and drapes were removed and the patient's eye was patched with Polymixin/Bacitracin ophthalmic ointment. An eye shield was placed and the patient was transferred alert and conversant from the operating room to the post-operative recovery area.   Shade Flood, MD

## 2012-07-07 NOTE — OR Nursing (Signed)
Patient denies latex allergy per Freida Busman, RN.  Oralia Manis, RN

## 2012-07-08 ENCOUNTER — Encounter (HOSPITAL_COMMUNITY): Payer: Self-pay | Admitting: Ophthalmology

## 2012-08-16 ENCOUNTER — Encounter: Payer: Self-pay | Admitting: Obstetrics and Gynecology

## 2012-08-16 ENCOUNTER — Ambulatory Visit: Payer: Medicare Other | Admitting: Obstetrics and Gynecology

## 2012-08-16 VITALS — BP 130/76 | Wt 189.0 lb

## 2012-08-16 DIAGNOSIS — N760 Acute vaginitis: Secondary | ICD-10-CM

## 2012-08-16 DIAGNOSIS — N898 Other specified noninflammatory disorders of vagina: Secondary | ICD-10-CM

## 2012-08-16 DIAGNOSIS — N819 Female genital prolapse, unspecified: Secondary | ICD-10-CM

## 2012-08-16 LAB — POCT WET PREP (WET MOUNT)
Bacteria Wet Prep HPF POC: NEGATIVE
Clue Cells Wet Prep Whiff POC: POSITIVE
WBC, Wet Prep HPF POC: NEGATIVE
pH: 5

## 2012-08-16 NOTE — Progress Notes (Signed)
Subjective:    Casey Liu is a 78 y.o. female No obstetric history on file. who presents for an exam. The patient has had a hysterectomy.  In February of 2012 the patient was fitted for a pessary because of pelvic organ prolapse.  She is doing well with a size 5 ring pessary.  She is not sexually active. The patient complaints of irritation to the vulva and vagina.  The following portions of the patient's history were reviewed and updated as appropriate: allergies, current medications, past family history, past medical history, past social history, past surgical history and problem list.  Review of Systems the patient complains of pain in her knees.  She recently had cataract surgery and she is trying to adjust her vision. Gastrointestinal:No change in bowel habits, no abdominal pain, no rectal bleeding Genitourinary:negative for dysuria, frequency, hematuria, nocturia and urinary incontinence    Objective:     BP 130/76  Wt 189 lb (85.73 kg)  Weight:  Wt Readings from Last 1 Encounters:  08/16/12 189 lb (85.73 kg)     BMI: There is no height on file to calculate BMI. General Appearance: Alert, appropriate appearance for age. No acute distress HEENT: Grossly normal Neck / Thyroid: Supple, no masses, nodes or enlargement Lungs: clear to auscultation bilaterally Back: No CVA tenderness Breast Exam: No masses or nodes.No dimpling, nipple retraction or discharge. Cardiovascular: Regular rate and rhythm. S1, S2, no murmur Gastrointestinal: Soft, non-tender, no masses or organomegaly  ++++++++++++++++++++++++++++++++++++++++++++++++++++++++  Pelvic Exam: External genitalia: normal general appearance Vaginal: normal without tenderness, induration or masses and relaxation present.  No lesions. Cervix: absent Adnexa: normal bimanual exam Uterus: absent Dry skin noted on the vulva Rectovaginal: not  indicated  ++++++++++++++++++++++++++++++++++++++++++++++++++++++++  Lymphatic Exam: Non-palpable nodes in neck, clavicular, axillary, or inguinal regions  Psychiatric: Alert and oriented, appropriate affect.  Wet prep: No clue cells or yeast seen     Assessment:    pelvic relaxation   Vaginal and vulvar irritation  Overweight or obese: Yes  Pelvic relaxation: Yes  Plan:    continue pessary use.  Continue to clean the pessary on a regular basis.   Olive oil or Crisco oil to the vulva for comfort.  Follow-up:  for annual exam  The updated Pap smear screening guidelines were discussed with the patient. The patient requested that I obtain a Pap smear: No.  Kegel exercises discussed: No.  Proper diet and regular exercise were reviewed.  Annual mammograms recommended starting at age 29. Proper breast care was discussed.  Screening colonoscopy is recommended beginning at age 71.  Regular health maintenance was reviewed.  Sleep hygiene was discussed.  Mylinda Latina.D.

## 2013-02-24 ENCOUNTER — Other Ambulatory Visit (HOSPITAL_COMMUNITY): Payer: Self-pay | Admitting: Internal Medicine

## 2013-02-24 DIAGNOSIS — Z1231 Encounter for screening mammogram for malignant neoplasm of breast: Secondary | ICD-10-CM

## 2013-03-30 ENCOUNTER — Ambulatory Visit (HOSPITAL_COMMUNITY)
Admission: RE | Admit: 2013-03-30 | Discharge: 2013-03-30 | Disposition: A | Discharge status: Home / Self Care | Payer: Medicare Other | Source: Ambulatory Visit | Attending: Internal Medicine | Admitting: Internal Medicine

## 2013-03-30 DIAGNOSIS — Z1231 Encounter for screening mammogram for malignant neoplasm of breast: Secondary | ICD-10-CM

## 2013-04-04 ENCOUNTER — Other Ambulatory Visit: Payer: Self-pay | Admitting: Internal Medicine

## 2013-04-04 DIAGNOSIS — R928 Other abnormal and inconclusive findings on diagnostic imaging of breast: Secondary | ICD-10-CM

## 2013-04-22 ENCOUNTER — Ambulatory Visit
Admission: RE | Admit: 2013-04-22 | Discharge: 2013-04-22 | Disposition: A | Discharge status: Home / Self Care | Payer: Medicaid Other | Source: Ambulatory Visit | Attending: Internal Medicine | Admitting: Internal Medicine

## 2013-04-22 DIAGNOSIS — R928 Other abnormal and inconclusive findings on diagnostic imaging of breast: Secondary | ICD-10-CM

## 2013-05-18 ENCOUNTER — Encounter: Payer: Medicare Other | Admitting: Obstetrics & Gynecology

## 2013-10-24 ENCOUNTER — Other Ambulatory Visit: Payer: Self-pay | Admitting: Internal Medicine

## 2013-10-24 DIAGNOSIS — R921 Mammographic calcification found on diagnostic imaging of breast: Secondary | ICD-10-CM

## 2013-10-27 ENCOUNTER — Ambulatory Visit
Admission: RE | Admit: 2013-10-27 | Discharge: 2013-10-27 | Disposition: A | Discharge status: Home / Self Care | Payer: Medicaid Other | Source: Ambulatory Visit | Attending: Internal Medicine | Admitting: Internal Medicine

## 2013-10-27 DIAGNOSIS — R921 Mammographic calcification found on diagnostic imaging of breast: Secondary | ICD-10-CM

## 2014-04-05 ENCOUNTER — Other Ambulatory Visit: Payer: Self-pay | Admitting: Internal Medicine

## 2014-04-05 DIAGNOSIS — R921 Mammographic calcification found on diagnostic imaging of breast: Secondary | ICD-10-CM

## 2014-04-07 ENCOUNTER — Other Ambulatory Visit: Payer: Self-pay | Admitting: Internal Medicine

## 2014-04-07 ENCOUNTER — Other Ambulatory Visit: Payer: Self-pay

## 2014-04-07 DIAGNOSIS — R921 Mammographic calcification found on diagnostic imaging of breast: Secondary | ICD-10-CM

## 2014-04-12 ENCOUNTER — Ambulatory Visit
Admission: RE | Admit: 2014-04-12 | Discharge: 2014-04-12 | Disposition: A | Discharge status: Home / Self Care | Payer: Medicare Other | Source: Ambulatory Visit | Attending: Internal Medicine | Admitting: Internal Medicine

## 2014-04-12 DIAGNOSIS — R921 Mammographic calcification found on diagnostic imaging of breast: Secondary | ICD-10-CM

## 2014-10-27 ENCOUNTER — Other Ambulatory Visit: Payer: Self-pay | Admitting: Internal Medicine

## 2014-10-27 ENCOUNTER — Other Ambulatory Visit: Payer: Self-pay | Admitting: *Deleted

## 2014-10-27 ENCOUNTER — Ambulatory Visit
Admission: RE | Admit: 2014-10-27 | Discharge: 2014-10-27 | Disposition: A | Discharge status: Home / Self Care | Payer: Medicare Other | Source: Ambulatory Visit | Attending: Internal Medicine | Admitting: Internal Medicine

## 2014-10-27 DIAGNOSIS — M25511 Pain in right shoulder: Secondary | ICD-10-CM

## 2015-03-16 ENCOUNTER — Other Ambulatory Visit (HOSPITAL_COMMUNITY): Payer: Self-pay | Admitting: Internal Medicine

## 2015-03-16 DIAGNOSIS — Z1231 Encounter for screening mammogram for malignant neoplasm of breast: Secondary | ICD-10-CM

## 2015-04-16 ENCOUNTER — Ambulatory Visit (HOSPITAL_COMMUNITY): Payer: Medicare Other

## 2015-04-17 ENCOUNTER — Other Ambulatory Visit: Payer: Self-pay | Admitting: Internal Medicine

## 2015-04-17 DIAGNOSIS — R921 Mammographic calcification found on diagnostic imaging of breast: Secondary | ICD-10-CM

## 2015-04-18 ENCOUNTER — Ambulatory Visit (HOSPITAL_COMMUNITY): Payer: Medicare Other

## 2015-04-25 ENCOUNTER — Ambulatory Visit
Admission: RE | Admit: 2015-04-25 | Discharge: 2015-04-25 | Disposition: A | Discharge status: Home / Self Care | Payer: Medicare Other | Source: Ambulatory Visit | Attending: Internal Medicine | Admitting: Internal Medicine

## 2015-04-25 DIAGNOSIS — R921 Mammographic calcification found on diagnostic imaging of breast: Secondary | ICD-10-CM

## 2015-10-23 ENCOUNTER — Other Ambulatory Visit: Payer: Self-pay | Admitting: Orthopedic Surgery

## 2015-11-01 ENCOUNTER — Inpatient Hospital Stay (HOSPITAL_COMMUNITY)
Admission: RE | Admit: 2015-11-01 | Discharge: 2015-11-01 | Disposition: A | Discharge status: Home / Self Care | Payer: Medicare Other | Source: Ambulatory Visit

## 2015-11-01 ENCOUNTER — Other Ambulatory Visit (HOSPITAL_COMMUNITY): Payer: Self-pay | Admitting: *Deleted

## 2015-11-01 NOTE — Pre-Procedure Instructions (Addendum)
Instrucciones Para Antes de la Ciruga   Su ciruga est programada para Monday, November 12, 2015 at 10:35 AM.   [facility] Entrance "A" Admitting Office at 8:30 AM.     Por favor llame al [phone] si tiene algn problema en la maana de la ciruga.  Any questions prior to day of surgery, please call [phone] between 8 & 4 PM.                  Recuerde:    No coma alimentos ni tome lquidos, incluyendo agua, despus de la medianoche del Sunday, 11/11/15.    Tome estas medicinas en la maana de la ciruga con un [place] de agua: Atenolol (Tenormin), Esomeprazole (Nexium), Nifedipine (Adalat), Eye drops, Tramadol - if needed  Puede cepillarse los dientes en la maana de la [place]. (you may brush your teeth the morning of surgery)   No use joyas, maquillaje de ojos, lpiz labial, crema para el cuerpo o esmalte de uas oscuro.    Puede usar desodorante.    Si va a ser ingresado despues de la ciruga, deje la [employer] en el carro hasta que se le haya asignado una habitacin.    How to Manage Your Diabetes Before Surgery   Why is it important to control my blood sugar before and after surgery?   Improving blood sugar levels before and after surgery helps healing and can limit problems.  A way of improving blood sugar control is eating a healthy diet by:  - Eating less sugar and carbohydrates  - Increasing activity/exercise  - Talk with your doctor about reaching your blood sugar goals  High blood sugars (greater than 180 mg/dL) can raise your risk of infections and slow down your recovery so you will need to focus on controlling your diabetes during the weeks before surgery.  Make sure that the doctor who takes care of your diabetes knows about your planned surgery including the date and location.  How do I manage my blood sugars before surgery?   Check your blood sugar at least 4 times a day, 2 days before  surgery to make sure that they are not too high or low.  Check your blood sugar the morning of your surgery when you wake up and every 2 hours until you get to the Short-Stay unit.  Treat a low blood sugar (less than 70 mg/dL) with 1/2 cup of clear juice (cranberry or apple), 4 glucose tablets, OR glucose gel.  Recheck blood sugar in 15 minutes after treatment (to make sure it is greater than 70 mg/dL).  If blood sugar is not greater than 70 mg/dL on re-check, call [phone] for further instructions.   Report your blood sugar to the Short-Stay nurse when you get to Short-Stay.  References:  [facility], 2007 "How to Manage your Diabetes Before and After Surgery".  What do I do about my diabetes medications?   Do not take oral diabetes medicines (pills) the morning of surgery.   [place] - Preparing for Surgery  Before surgery, you can play an important role.  Because skin is not sterile, your skin needs to be as free of germs as possible.  You can reduce the number of germs on you skin by washing with CHG (chlorahexidine gluconate) soap before surgery.  CHG is an antiseptic cleaner which kills germs and bonds with the skin to continue killing germs even after washing.  Please DO NOT use if you have  an allergy to CHG or antibacterial soaps.  If your skin becomes reddened/irritated stop using the CHG and inform your nurse when you arrive at Short Stay.  Do not shave (including legs and underarms) for at least 48 hours prior to the first CHG shower.  You may shave your face.  Please follow these instructions carefully:   1.  Shower with CHG Soap the night before surgery and the                                morning of Surgery.  2.  If you choose to wash your hair, wash your hair first as usual with your       normal shampoo.  3.  After you shampoo, rinse your hair and body thoroughly to remove the                      Shampoo.  4.  Use CHG as you would  any other liquid soap.  You can apply chg directly       to the skin and wash gently with scrungie or a clean washcloth.  5.  Apply the CHG Soap to your body ONLY FROM THE NECK DOWN.        Do not use on open wounds or open sores.  Avoid contact with your eyes, ears, mouth and genitals (private parts).  Wash genitals (private parts) with your normal soap.  6.  Wash thoroughly, paying special attention to the area where your surgery        will be performed.  7.  Thoroughly rinse your body with warm water from the neck down.  8.  DO NOT shower/wash with your normal soap after using and rinsing off       the CHG Soap.  9.  Pat yourself dry with a clean towel.            10.  Wear clean pajamas.            11.  Place clean sheets on your bed the night of your first shower and do not        sleep with pets.  Day of Surgery  Do not apply any lotions the morning of surgery.  Please wear clean clothes to the hospital.    Your procedure is scheduled on Monday, November 12, 2015 at 10:35 AM.  Report to [facility] Entrance "A" Admitting Office at 8:30 AM.  Call this number if you have problems the morning of surgery: [phone]  Any questions prior to day of surgery, please call [phone] between 8 & 4 PM.   Remember:  Do not eat food or drink liquids after midnight Sunday, 11/11/15.  Take these medicines the morning of surgery with A SIP OF WATER: Atenolol (Tenormin), Esomeprazole (Nexium), Nifedipine (Adalat), Eye drops, Tramadol - if needed   Do not wear jewelry, make-up or nail polish.  Do not wear lotions, powders, or perfumes.  You may wear deodorant.  Do not shave 48 hours prior to surgery.  Men may shave face and neck.  Do not bring valuables to the hospital.  [facility] is not responsible for any belongings or valuables.  Contacts, dentures or bridgework may not be worn into surgery.  Leave your suitcase in the car.  After surgery it may be brought to your room.  For patients  admitted to the hospital, discharge time will  be determined by your treatment team.  Please read over the following fact sheets that you were given. Pain Booklet, Coughing and Deep Breathing, Blood Transfusion Information, MRSA Information and Surgical Site Infection Prevention

## 2015-11-05 ENCOUNTER — Encounter (HOSPITAL_COMMUNITY): Payer: Self-pay

## 2015-11-05 ENCOUNTER — Encounter (HOSPITAL_COMMUNITY)
Admission: RE | Admit: 2015-11-05 | Discharge: 2015-11-05 | Disposition: A | Discharge status: Home / Self Care | Payer: Medicare Other | Source: Ambulatory Visit | Attending: Orthopedic Surgery | Admitting: Orthopedic Surgery

## 2015-11-05 DIAGNOSIS — I10 Essential (primary) hypertension: Secondary | ICD-10-CM | POA: Diagnosis not present

## 2015-11-05 DIAGNOSIS — Z01812 Encounter for preprocedural laboratory examination: Secondary | ICD-10-CM | POA: Insufficient documentation

## 2015-11-05 DIAGNOSIS — I517 Cardiomegaly: Secondary | ICD-10-CM | POA: Diagnosis not present

## 2015-11-05 DIAGNOSIS — Z01818 Encounter for other preprocedural examination: Secondary | ICD-10-CM | POA: Insufficient documentation

## 2015-11-05 DIAGNOSIS — M1711 Unilateral primary osteoarthritis, right knee: Secondary | ICD-10-CM | POA: Insufficient documentation

## 2015-11-05 DIAGNOSIS — R918 Other nonspecific abnormal finding of lung field: Secondary | ICD-10-CM | POA: Insufficient documentation

## 2015-11-05 DIAGNOSIS — Z0183 Encounter for blood typing: Secondary | ICD-10-CM | POA: Diagnosis not present

## 2015-11-05 DIAGNOSIS — E119 Type 2 diabetes mellitus without complications: Secondary | ICD-10-CM | POA: Insufficient documentation

## 2015-11-05 HISTORY — DX: Sleep apnea, unspecified: G47.30

## 2015-11-05 LAB — CBC WITH DIFFERENTIAL/PLATELET
Basophils Absolute: 0 10*3/uL (ref 0.0–0.1)
Basophils Relative: 0 %
Eosinophils Absolute: 0.1 10*3/uL (ref 0.0–0.7)
Eosinophils Relative: 1 %
HCT: 42.8 % (ref 36.0–46.0)
Hemoglobin: 13.4 g/dL (ref 12.0–15.0)
Lymphocytes Relative: 35 %
Lymphs Abs: 3.4 10*3/uL (ref 0.7–4.0)
MCH: 28.5 pg (ref 26.0–34.0)
MCHC: 31.3 g/dL (ref 30.0–36.0)
MCV: 90.9 fL (ref 78.0–100.0)
Monocytes Absolute: 0.6 10*3/uL (ref 0.1–1.0)
Monocytes Relative: 6 %
Neutro Abs: 5.6 10*3/uL (ref 1.7–7.7)
Neutrophils Relative %: 58 %
Platelets: 235 10*3/uL (ref 150–400)
RBC: 4.71 MIL/uL (ref 3.87–5.11)
RDW: 15.1 % (ref 11.5–15.5)
WBC: 9.7 10*3/uL (ref 4.0–10.5)

## 2015-11-05 LAB — PROTIME-INR
INR: 1.04 (ref 0.00–1.49)
Prothrombin Time: 13.8 seconds (ref 11.6–15.2)

## 2015-11-05 LAB — TYPE AND SCREEN
ABO/RH(D): A POS
Antibody Screen: NEGATIVE

## 2015-11-05 LAB — ABO/RH: ABO/RH(D): A POS

## 2015-11-05 LAB — BASIC METABOLIC PANEL
Anion gap: 13 (ref 5–15)
BUN: 25 mg/dL — ABNORMAL HIGH (ref 6–20)
CO2: 18 mmol/L — ABNORMAL LOW (ref 22–32)
Calcium: 9.9 mg/dL (ref 8.9–10.3)
Chloride: 108 mmol/L (ref 101–111)
Creatinine, Ser: 0.79 mg/dL (ref 0.44–1.00)
GFR calc Af Amer: >60 mL/min (ref >60)
GFR calc non Af Amer: >60 mL/min (ref >60)
Glucose, Bld: 94 mg/dL (ref 65–99)
Potassium: 4.7 mmol/L (ref 3.5–5.1)
Sodium: 139 mmol/L (ref 135–145)

## 2015-11-05 LAB — GLUCOSE, CAPILLARY: Glucose-Capillary: 80 mg/dL (ref 65–99)

## 2015-11-05 LAB — APTT: aPTT: 24 seconds (ref 24–37)

## 2015-11-05 LAB — SURGICAL PCR SCREEN
MRSA, PCR: NEGATIVE
Staphylococcus aureus: NEGATIVE

## 2015-11-06 LAB — URINALYSIS, ROUTINE W REFLEX MICROSCOPIC
Bilirubin Urine: NEGATIVE
Glucose, UA: NEGATIVE mg/dL
Hgb urine dipstick: NEGATIVE
Ketones, ur: NEGATIVE mg/dL
Nitrite: NEGATIVE
Protein, ur: NEGATIVE mg/dL
Specific Gravity, Urine: 1.015 (ref 1.005–1.030)
pH: 7 (ref 5.0–8.0)

## 2015-11-06 LAB — HEMOGLOBIN A1C
Hgb A1c MFr Bld: 6.5 % — ABNORMAL HIGH (ref 4.8–5.6)
Mean Plasma Glucose: 140 mg/dL

## 2015-11-06 LAB — URINE MICROSCOPIC-ADD ON

## 2015-11-06 NOTE — Progress Notes (Addendum)
Anesthesia Chart Review: Patient is a 81 year old female scheduled for right TKA on 11/12/15 by Dr. Mayer Camel. Patient is Spanish speaking.  History includes non-smoker, DM2, HTN, OSA with CPAP, mild to moderate pulmonary hypertension by 07/2011 echo, cholecystectomy, appendectomy, hysterectomy, multiple eye surgeries. PCP is listed as Dr. Jonelle Sidle [facility], phone [phone], fax [phone]). She has been evaluated by cardiologist Dr. Einar Gip in the past, but was discharged with PRN cardiology follow-up in 12/2013 with recommendation for sleep medicine referral and continued primary care follow-up. He was seeing her for dyspnea which improved significantly with more aggressive treatment of her HTN.   Meds include atenolol, Combigan and Lotemax, Nexium, lisinopril, metformin, nifedipine, tramadol.   PAT Vitals: BP 152/68, HR 63, RR 18, T 36.6C, O2 sat 98%. CBG 80.  11/05/15 EKG: NSR.  08/14/11 Echo [facility] CV): Normal LVEF, EF 55-60%, mild to moderate LVH, grade I/IV diastolic dysfunction, RVH, mild TR, mild to moderate pulmonary hypertension.Her echo was felt stable since at least 02/05/2011.  02/07/11 Lexiscan Sestamibi [facility] CV): Normal perfusion without ischemia or scar, normal LVEF.  11/05/15 CXR: IMPRESSION: Cardiomegaly with mild interstitial prominence bilaterally suspicious for low-grade CHF in the appropriate clinical setting. Probable right lower lobe atelectasis or pneumonia. Correlation with the patient's clinical examination would be useful.  Preoperative labs noted. Cr 0.79. CBC, PT/PTT WNL. A1c 6.5. UA showed moderate leukocytes, 6-30 WBC, negative nitrites.   CXR and UA results called to [facility] at Dr. Damita Dunnings office. They are contacting patient to review UA results and inquire about any symptoms and are asking that she have medical evaluation prior to surgery. Discussed with anesthesiologist Dr. Smith Robert. If patient without acute cardiopulmonary symptoms, then we would  defer decision for re-evaluation by cardiology to Dr. Jonelle Sidle or evaluating practitioner. Will leave chart for follow-up regarding PCP input.  George Hugh [facility] Short Stay Center/Anesthesiology Phone [phone] 11/06/2015 4:11 PM  Addendum: A letter from Dr. Jerilynn Mages. Barbette Merino received this afternoon. It read: "All Concerned: This is to certify that the patient listed above was seen in our practice on November 08, 2015 and is cleared to undergo surgery. If you have any questions or concerns please contact on the above listed phone or fax." (See fax.)  George Hugh [facility] Short Stay Center/Anesthesiology Phone [phone] 11/09/2015 5:00 PM

## 2015-11-09 NOTE — H&P (Signed)
TOTAL KNEE ADMISSION H&P  Patient is being admitted for right total knee arthroplasty.  Subjective:  Chief Complaint:right knee pain.  HPI: Casey Liu, 81 y.o. female, has a history of pain and functional disability in the right knee due to arthritis and has failed non-surgical conservative treatments for greater than 12 weeks to includeNSAID's and/or analgesics, corticosteriod injections, viscosupplementation injections, flexibility and strengthening excercises and activity modification.  Onset of symptoms was gradual, starting 10 years ago with gradually worsening course since that time. The patient noted no past surgery on the right knee(s).  Patient currently rates pain in the right knee(s) at 10 out of 10 with activity. Patient has night pain, worsening of pain with activity and weight bearing, pain that interferes with activities of daily living and pain with passive range of motion.  Patient has evidence of joint space narrowing by imaging studies.  There is no active infection.  Patient Active Problem List   Diagnosis Date Noted  . 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 Vaginitis 08/16/2012   Past Medical History  Diagnosis Date  . Family history of anesthesia complication     mother had "three heart attacks after anesthesia"  . Seasonal allergies   . Pneumonia     hx of   . Diabetes mellitus without complication ([place])   . Urinary tract infection     hx of  . Headache(784.0)     hx of  . Arthritis   . History of stomach ulcers   . Cataract     right  . Hypertension     Dr. Jonelle Sidle [phone]  . Sleep apnea     cpap    Past Surgical History  Procedure Laterality Date  . Eye surgery      "seven eye surgery"  . Abdominal hysterectomy    . Cesarean section      x 1  . Bladder surgery    . Ovarian cyst removal    . Knee surgery      left knee  . Appendectomy    . Cholecystectomy    . Tubal ligation    . Cataract extraction w/phaco  07/07/2012     Procedure: CATARACT EXTRACTION PHACO AND INTRAOCULAR LENS PLACEMENT (IOC);  Surgeon: Adonis Brook, MD;  Location: [place];  Service: Ophthalmology;  Laterality: Right;    No prescriptions prior to admission   Allergies  Allergen Reactions  . Celebrex [Celecoxib] Nausea And Vomiting and Palpitations  . Latex Rash    Social History  Substance Use Topics  . Smoking status: Never Smoker   . Smokeless tobacco: Not on file  . Alcohol Use: No     Comment: social hx     No family history on file.   Review of Systems  Constitutional: Negative.   HENT: Positive for hearing loss.   Eyes:       Glasses  Respiratory: Positive for shortness of breath.   Cardiovascular:       HTN  Gastrointestinal: Negative.   Genitourinary: Negative.   Musculoskeletal: Positive for joint pain.  Skin: Negative.   Neurological: Positive for headaches.  Endo/Heme/Allergies: Negative.   Psychiatric/Behavioral: Negative.     Objective:  Physical Exam  Constitutional: She is oriented to person, place, and time. She appears well-developed and well-nourished.  HENT:  Head: Normocephalic and atraumatic.  Eyes: Pupils are equal, round, and reactive to light.  Neck: Normal range of motion. Neck supple.  Cardiovascular: Intact distal pulses.   Respiratory: Effort normal.  Musculoskeletal:  Knees have obvious varus deformity.  Range of motion bilateral knees, 5/130, tender along the medial joint lines.  Good quadriceps and hamstring power.  Toes are pink and well perfused.  Sensation is grossly intact, although mildly diminished to the soles of the feet.  Neurological: She is alert and oriented to person, place, and time.  Skin: Skin is warm and dry.  Psychiatric: She has a normal mood and affect. Her behavior is normal. Judgment and thought content normal.    Vital signs in last 24 hours:    Labs:   Estimated body mass index is 31.45 kg/(m^2) as calculated from the following:   Height as of  07/05/12: 5\' 5"  (1.651 m).   Weight as of 08/16/12: 85.73 kg (189 lb).   Imaging Review Plain radiographs demonstrate 4 view x-ray of her knees reveals she has multicompartment degenerative changes with advanced loss of joint space and patellofemoral and medial compartments.  She has subchondral sclerosing along the medial compartments as well.  These were reviewed with comparison to 2009 x-rays.  This much more advancement of the left knee medial compartment compared to today's x-rays.   Assessment/Plan:  End stage arthritis, right knee   The patient history, physical examination, clinical judgment of the provider and imaging studies are consistent with end stage degenerative joint disease of the right knee(s) and total knee arthroplasty is deemed medically necessary. The treatment options including medical management, injection therapy arthroscopy and arthroplasty were discussed at length. The risks and benefits of total knee arthroplasty were presented and reviewed. The risks due to aseptic loosening, infection, stiffness, patella tracking problems, thromboembolic complications and other imponderables were discussed. The patient acknowledged the explanation, agreed to proceed with the plan and consent was signed. Patient is being admitted for inpatient treatment for surgery, pain control, PT, OT, prophylactic antibiotics, VTE prophylaxis, progressive ambulation and ADL's and discharge planning. The patient is planning to be discharged home with home health services

## 2015-11-09 NOTE — Progress Notes (Signed)
Spoke with patient's grandaughter Judith Part about patient needing to arrive 730 am 11/12/15.

## 2015-11-11 DIAGNOSIS — M1711 Unilateral primary osteoarthritis, right knee: Secondary | ICD-10-CM | POA: Diagnosis present

## 2015-11-11 MED ORDER — DEXTROSE 5 % IV SOLN
3.0000 g | INTRAVENOUS | Status: AC
  Administered 2015-11-12: 3 g via INTRAVENOUS
  Filled 2015-11-11: qty 3000

## 2015-11-12 ENCOUNTER — Encounter (HOSPITAL_COMMUNITY): Payer: Self-pay | Admitting: *Deleted

## 2015-11-12 ENCOUNTER — Inpatient Hospital Stay (HOSPITAL_COMMUNITY)
Admission: RE | Admit: 2015-11-12 | Discharge: 2015-11-14 | DRG: 470 | Disposition: A | Discharge status: Home Health | Payer: Medicare Other | Source: Ambulatory Visit | Attending: Orthopedic Surgery | Admitting: Orthopedic Surgery

## 2015-11-12 ENCOUNTER — Encounter (HOSPITAL_COMMUNITY)
Admission: RE | Disposition: A | Discharge status: Home Health | Payer: Self-pay | Source: Ambulatory Visit | Attending: Orthopedic Surgery

## 2015-11-12 ENCOUNTER — Inpatient Hospital Stay (HOSPITAL_COMMUNITY): Payer: Medicare Other | Admitting: Vascular Surgery

## 2015-11-12 ENCOUNTER — Inpatient Hospital Stay (HOSPITAL_COMMUNITY): Payer: Medicare Other | Admitting: Anesthesiology

## 2015-11-12 DIAGNOSIS — K219 Gastro-esophageal reflux disease without esophagitis: Secondary | ICD-10-CM | POA: Diagnosis present

## 2015-11-12 DIAGNOSIS — G4730 Sleep apnea, unspecified: Secondary | ICD-10-CM | POA: Diagnosis present

## 2015-11-12 DIAGNOSIS — E119 Type 2 diabetes mellitus without complications: Secondary | ICD-10-CM | POA: Diagnosis present

## 2015-11-12 DIAGNOSIS — I10 Essential (primary) hypertension: Secondary | ICD-10-CM | POA: Diagnosis present

## 2015-11-12 DIAGNOSIS — M1711 Unilateral primary osteoarthritis, right knee: Principal | ICD-10-CM | POA: Diagnosis present

## 2015-11-12 DIAGNOSIS — M25561 Pain in right knee: Secondary | ICD-10-CM | POA: Diagnosis present

## 2015-11-12 HISTORY — PX: TOTAL KNEE ARTHROPLASTY: SHX125

## 2015-11-12 LAB — GLUCOSE, CAPILLARY
Glucose-Capillary: 110 mg/dL — ABNORMAL HIGH (ref 65–99)
Glucose-Capillary: 98 mg/dL (ref 65–99)
Glucose-Capillary: 164 mg/dL — ABNORMAL HIGH (ref 65–99)
Glucose-Capillary: 135 mg/dL — ABNORMAL HIGH (ref 65–99)

## 2015-11-12 SURGERY — ARTHROPLASTY, KNEE, TOTAL
Anesthesia: Spinal | Site: Knee | Laterality: Right

## 2015-11-12 MED ORDER — SODIUM CHLORIDE 0.9 % IJ SOLN
INTRAMUSCULAR | Status: DC | PRN
  Administered 2015-11-12: 20 mL

## 2015-11-12 MED ORDER — SODIUM CHLORIDE 0.9 % IV SOLN
INTRAVENOUS | Status: DC
Start: 2015-11-12 — End: 2015-11-12

## 2015-11-12 MED ORDER — METHOCARBAMOL 500 MG PO TABS
ORAL_TABLET | ORAL | Status: AC
  Filled 2015-11-12: qty 1

## 2015-11-12 MED ORDER — CEFUROXIME SODIUM 1.5 G IJ SOLR
INTRAMUSCULAR | Status: DC | PRN
  Administered 2015-11-12: 1.5 g

## 2015-11-12 MED ORDER — BUPIVACAINE LIPOSOME 1.3 % IJ SUSP
20.0000 mL | Freq: Once | INTRAMUSCULAR | Status: AC
  Administered 2015-11-12: 20 mL
  Filled 2015-11-12: qty 20

## 2015-11-12 MED ORDER — ONDANSETRON HCL 4 MG/2ML IJ SOLN
4.0000 mg | Freq: Four times a day (QID) | INTRAMUSCULAR | Status: DC | PRN
  Administered 2015-11-13: 4 mg via INTRAVENOUS

## 2015-11-12 MED ORDER — MENTHOL 3 MG MT LOZG: 1.0000 | LOZENGE | OROMUCOSAL | Status: DC | PRN

## 2015-11-12 MED ORDER — FENTANYL CITRATE (PF) 100 MCG/2ML IJ SOLN
INTRAMUSCULAR | Status: AC
  Filled 2015-11-12: qty 2

## 2015-11-12 MED ORDER — TRANEXAMIC ACID 1000 MG/10ML IV SOLN
1000.0000 mg | INTRAVENOUS | Status: AC
  Administered 2015-11-12: 1000 mg via INTRAVENOUS
  Filled 2015-11-12: qty 10

## 2015-11-12 MED ORDER — CEFUROXIME SODIUM 1.5 G IJ SOLR
INTRAMUSCULAR | Status: AC
  Filled 2015-11-12: qty 1.5

## 2015-11-12 MED ORDER — ASPIRIN EC 325 MG PO TBEC: 325.0000 mg | DELAYED_RELEASE_TABLET | Freq: Two times a day (BID) | ORAL | Status: DC

## 2015-11-12 MED ORDER — METFORMIN HCL 500 MG PO TABS
500.0000 mg | ORAL_TABLET | Freq: Two times a day (BID) | ORAL | Status: DC
  Administered 2015-11-12 – 2015-11-14 (×4): 500 mg via ORAL
  Filled 2015-11-12 (×4): qty 1

## 2015-11-12 MED ORDER — FENTANYL CITRATE (PF) 250 MCG/5ML IJ SOLN
INTRAMUSCULAR | Status: AC
  Filled 2015-11-12: qty 5

## 2015-11-12 MED ORDER — LACTATED RINGERS IV SOLN
INTRAVENOUS | Status: DC | PRN
  Administered 2015-11-12 (×2): via INTRAVENOUS

## 2015-11-12 MED ORDER — TIMOLOL MALEATE 0.5 % OP SOLN
1.0000 [drp] | Freq: Two times a day (BID) | OPHTHALMIC | Status: DC
  Administered 2015-11-13 – 2015-11-14 (×2): 1 [drp] via OPHTHALMIC
  Filled 2015-11-12: qty 5

## 2015-11-12 MED ORDER — PANTOPRAZOLE SODIUM 40 MG PO TBEC
80.0000 mg | DELAYED_RELEASE_TABLET | Freq: Every day | ORAL | Status: DC
  Administered 2015-11-12 – 2015-11-14 (×3): 80 mg via ORAL
  Filled 2015-11-12 (×2): qty 2

## 2015-11-12 MED ORDER — LISINOPRIL 20 MG PO TABS
20.0000 mg | ORAL_TABLET | Freq: Every day | ORAL | Status: DC
  Administered 2015-11-13: 20 mg via ORAL
  Filled 2015-11-12 (×4): qty 1

## 2015-11-12 MED ORDER — ONDANSETRON HCL 4 MG/2ML IJ SOLN
INTRAMUSCULAR | Status: DC | PRN
  Administered 2015-11-12: 4 mg via INTRAVENOUS

## 2015-11-12 MED ORDER — DIPHENHYDRAMINE HCL 12.5 MG/5ML PO ELIX: 12.5000 mg | ORAL_SOLUTION | ORAL | Status: DC | PRN

## 2015-11-12 MED ORDER — HYDROMORPHONE HCL 1 MG/ML IJ SOLN
0.5000 mg | INTRAMUSCULAR | Status: DC | PRN
  Filled 2015-11-12: qty 1

## 2015-11-12 MED ORDER — METHOCARBAMOL 1000 MG/10ML IJ SOLN: 500.0000 mg | Freq: Four times a day (QID) | INTRAVENOUS | Status: DC | PRN

## 2015-11-12 MED ORDER — FLUTICASONE PROPIONATE 50 MCG/ACT NA SUSP
1.0000 | Freq: Every day | NASAL | Status: DC
  Administered 2015-11-12 – 2015-11-14 (×3): 1 via NASAL
  Filled 2015-11-12 (×2): qty 16

## 2015-11-12 MED ORDER — BUPIVACAINE HCL (PF) 0.5 % IJ SOLN
INTRAMUSCULAR | Status: AC
  Filled 2015-11-12: qty 30

## 2015-11-12 MED ORDER — TRANEXAMIC ACID 1000 MG/10ML IV SOLN
1000.0000 mg | Freq: Once | INTRAVENOUS | Status: AC
  Administered 2015-11-12: 1000 mg via INTRAVENOUS
  Filled 2015-11-12: qty 10

## 2015-11-12 MED ORDER — SODIUM CHLORIDE 0.9 % IR SOLN
Status: DC | PRN
  Administered 2015-11-12: 3000 mL

## 2015-11-12 MED ORDER — METHOCARBAMOL 500 MG PO TABS
500.0000 mg | ORAL_TABLET | Freq: Four times a day (QID) | ORAL | Status: DC | PRN
  Administered 2015-11-12 – 2015-11-14 (×5): 500 mg via ORAL
  Filled 2015-11-12 (×4): qty 1

## 2015-11-12 MED ORDER — METOCLOPRAMIDE HCL 5 MG/ML IJ SOLN: 5.0000 mg | Freq: Three times a day (TID) | INTRAMUSCULAR | Status: DC | PRN

## 2015-11-12 MED ORDER — MIDAZOLAM HCL 2 MG/2ML IJ SOLN
INTRAMUSCULAR | Status: AC
  Filled 2015-11-12: qty 2

## 2015-11-12 MED ORDER — BUPIVACAINE HCL 0.5 % IJ SOLN
INTRAMUSCULAR | Status: DC | PRN
  Administered 2015-11-12: 20 mL

## 2015-11-12 MED ORDER — METOCLOPRAMIDE HCL 5 MG PO TABS: 5.0000 mg | ORAL_TABLET | Freq: Three times a day (TID) | ORAL | Status: DC | PRN

## 2015-11-12 MED ORDER — KCL IN DEXTROSE-NACL 20-5-0.45 MEQ/L-%-% IV SOLN
INTRAVENOUS | Status: DC
  Administered 2015-11-12: 17:00:00 via INTRAVENOUS
  Filled 2015-11-12 (×2): qty 1000

## 2015-11-12 MED ORDER — ACETAMINOPHEN 325 MG PO TABS: 650.0000 mg | ORAL_TABLET | Freq: Four times a day (QID) | ORAL | Status: DC | PRN

## 2015-11-12 MED ORDER — LIDOCAINE HCL (CARDIAC) 20 MG/ML IV SOLN
INTRAVENOUS | Status: AC
  Filled 2015-11-12: qty 5

## 2015-11-12 MED ORDER — BISACODYL 5 MG PO TBEC: 5.0000 mg | DELAYED_RELEASE_TABLET | Freq: Every day | ORAL | Status: DC | PRN

## 2015-11-12 MED ORDER — DEXTROSE-NACL 5-0.45 % IV SOLN: INTRAVENOUS | Status: DC

## 2015-11-12 MED ORDER — PROPOFOL 500 MG/50ML IV EMUL
INTRAVENOUS | Status: DC | PRN
  Administered 2015-11-12: 50 ug/kg/min via INTRAVENOUS

## 2015-11-12 MED ORDER — LOTEPREDNOL ETABONATE 0.5 % OP SUSP
1.0000 [drp] | Freq: Every day | OPHTHALMIC | Status: DC
  Administered 2015-11-13 – 2015-11-14 (×2): 1 [drp] via OPHTHALMIC
  Filled 2015-11-12: qty 5

## 2015-11-12 MED ORDER — FENTANYL CITRATE (PF) 100 MCG/2ML IJ SOLN
25.0000 ug | INTRAMUSCULAR | Status: DC | PRN
  Administered 2015-11-12 (×2): 50 ug via INTRAVENOUS

## 2015-11-12 MED ORDER — MIDAZOLAM HCL 2 MG/2ML IJ SOLN
INTRAMUSCULAR | Status: AC
  Administered 2015-11-12: 1 mg
  Filled 2015-11-12: qty 2

## 2015-11-12 MED ORDER — MEPERIDINE HCL 25 MG/ML IJ SOLN: 6.2500 mg | INTRAMUSCULAR | Status: DC | PRN

## 2015-11-12 MED ORDER — ASPIRIN EC 325 MG PO TBEC
325.0000 mg | DELAYED_RELEASE_TABLET | Freq: Every day | ORAL | Status: DC
  Administered 2015-11-13 – 2015-11-14 (×2): 325 mg via ORAL
  Filled 2015-11-12 (×2): qty 1

## 2015-11-12 MED ORDER — ACETAMINOPHEN 650 MG RE SUPP
650.0000 mg | Freq: Four times a day (QID) | RECTAL | Status: DC | PRN
Start: 2015-11-12 — End: 2015-11-14

## 2015-11-12 MED ORDER — OXYCODONE-ACETAMINOPHEN 5-325 MG PO TABS: 1.0000 | ORAL_TABLET | ORAL | Status: DC | PRN

## 2015-11-12 MED ORDER — LACTATED RINGERS IV SOLN: INTRAVENOUS | Status: DC

## 2015-11-12 MED ORDER — LACTATED RINGERS IV SOLN
INTRAVENOUS | Status: DC
  Administered 2015-11-12: 09:00:00 via INTRAVENOUS

## 2015-11-12 MED ORDER — ALUM & MAG HYDROXIDE-SIMETH 200-200-20 MG/5ML PO SUSP
30.0000 mL | ORAL | Status: DC | PRN
  Administered 2015-11-14: 30 mL via ORAL
  Filled 2015-11-12: qty 30

## 2015-11-12 MED ORDER — BRIMONIDINE TARTRATE 0.2 % OP SOLN
1.0000 [drp] | Freq: Two times a day (BID) | OPHTHALMIC | Status: DC
  Administered 2015-11-13 – 2015-11-14 (×2): 1 [drp] via OPHTHALMIC
  Filled 2015-11-12: qty 5

## 2015-11-12 MED ORDER — BRIMONIDINE TARTRATE-TIMOLOL 0.2-0.5 % OP SOLN: 1.0000 [drp] | Freq: Two times a day (BID) | OPHTHALMIC | Status: DC

## 2015-11-12 MED ORDER — ONDANSETRON HCL 4 MG PO TABS
4.0000 mg | ORAL_TABLET | Freq: Four times a day (QID) | ORAL | Status: DC | PRN
  Administered 2015-11-13: 4 mg via ORAL
  Filled 2015-11-12 (×2): qty 1

## 2015-11-12 MED ORDER — FENTANYL CITRATE (PF) 100 MCG/2ML IJ SOLN
100.0000 ug | Freq: Once | INTRAMUSCULAR | Status: AC
  Administered 2015-11-12: 25 ug via INTRAVENOUS

## 2015-11-12 MED ORDER — BUPIVACAINE-EPINEPHRINE (PF) 0.5% -1:200000 IJ SOLN
INTRAMUSCULAR | Status: DC | PRN
  Administered 2015-11-12: 30 mL via PERINEURAL

## 2015-11-12 MED ORDER — MIDAZOLAM HCL 2 MG/2ML IJ SOLN: 2.0000 mg | Freq: Once | INTRAMUSCULAR | Status: DC

## 2015-11-12 MED ORDER — FLEET ENEMA 7-19 GM/118ML RE ENEM: 1.0000 | ENEMA | Freq: Once | RECTAL | Status: DC | PRN

## 2015-11-12 MED ORDER — ATENOLOL 50 MG PO TABS
100.0000 mg | ORAL_TABLET | Freq: Every day | ORAL | Status: DC
  Administered 2015-11-13 – 2015-11-14 (×2): 100 mg via ORAL
  Filled 2015-11-12 (×4): qty 2

## 2015-11-12 MED ORDER — CHLORHEXIDINE GLUCONATE 4 % EX LIQD: 60.0000 mL | Freq: Once | CUTANEOUS | Status: DC

## 2015-11-12 MED ORDER — PHENOL 1.4 % MT LIQD: 1.0000 | OROMUCOSAL | Status: DC | PRN

## 2015-11-12 MED ORDER — ONDANSETRON HCL 4 MG/2ML IJ SOLN
INTRAMUSCULAR | Status: AC
  Filled 2015-11-12: qty 2

## 2015-11-12 MED ORDER — OXYCODONE HCL 5 MG PO TABS
5.0000 mg | ORAL_TABLET | ORAL | Status: DC | PRN
  Administered 2015-11-12: 10 mg via ORAL
  Administered 2015-11-12: 5 mg via ORAL
  Administered 2015-11-12 – 2015-11-14 (×6): 10 mg via ORAL
  Filled 2015-11-12: qty 2
  Filled 2015-11-12: qty 1
  Filled 2015-11-12 (×6): qty 2

## 2015-11-12 MED ORDER — DOCUSATE SODIUM 100 MG PO CAPS
100.0000 mg | ORAL_CAPSULE | Freq: Two times a day (BID) | ORAL | Status: DC
  Administered 2015-11-12 – 2015-11-14 (×4): 100 mg via ORAL
  Filled 2015-11-12 (×5): qty 1

## 2015-11-12 MED ORDER — SENNOSIDES-DOCUSATE SODIUM 8.6-50 MG PO TABS: 1.0000 | ORAL_TABLET | Freq: Every evening | ORAL | Status: DC | PRN

## 2015-11-12 MED ORDER — EPHEDRINE SULFATE 50 MG/ML IJ SOLN
INTRAMUSCULAR | Status: DC | PRN
  Administered 2015-11-12: 5 mg via INTRAVENOUS
  Administered 2015-11-12 (×2): 10 mg via INTRAVENOUS

## 2015-11-12 MED ORDER — NIFEDIPINE ER OSMOTIC RELEASE 90 MG PO TB24
90.0000 mg | ORAL_TABLET | Freq: Every day | ORAL | Status: DC
  Administered 2015-11-14: 90 mg via ORAL
  Filled 2015-11-12 (×2): qty 1

## 2015-11-12 MED ORDER — INSULIN ASPART 100 UNIT/ML ~~LOC~~ SOLN
0.0000 [IU] | Freq: Three times a day (TID) | SUBCUTANEOUS | Status: DC
  Administered 2015-11-12: 3 [IU] via SUBCUTANEOUS

## 2015-11-12 MED ORDER — TIZANIDINE HCL 2 MG PO CAPS: 2.0000 mg | ORAL_CAPSULE | Freq: Three times a day (TID) | ORAL | Status: DC

## 2015-11-12 SURGICAL SUPPLY — 52 items
BANDAGE ESMARK 6X9 LF (GAUZE/BANDAGES/DRESSINGS) ×1 IMPLANT
BLADE SAG 18X100X1.27 (BLADE) ×2 IMPLANT
BLADE SAW SGTL 13X75X1.27 (BLADE) ×2 IMPLANT
BNDG ELASTIC 6X10 VLCR STRL LF (GAUZE/BANDAGES/DRESSINGS) ×2 IMPLANT
BNDG ESMARK 6X9 LF (GAUZE/BANDAGES/DRESSINGS) ×2
BOWL SMART MIX CTS (DISPOSABLE) ×2 IMPLANT
CAPT KNEE TOTAL 3 ATTUNE ×2 IMPLANT
CEMENT HV SMART SET (Cement) ×4 IMPLANT
COVER SURGICAL LIGHT HANDLE (MISCELLANEOUS) ×2 IMPLANT
CUFF TOURNIQUET SINGLE 34IN LL (TOURNIQUET CUFF) ×2 IMPLANT
DRAPE EXTREMITY T 121X128X90 (DRAPE) ×2 IMPLANT
DRAPE U-SHAPE 47X51 STRL (DRAPES) ×2 IMPLANT
DRSG AQUACEL AG ADV 3.5X10 (GAUZE/BANDAGES/DRESSINGS) ×2 IMPLANT
DURAPREP 26ML APPLICATOR (WOUND CARE) ×4 IMPLANT
ELECT REM PT RETURN 9FT ADLT (ELECTROSURGICAL) ×2
ELECTRODE REM PT RTRN 9FT ADLT (ELECTROSURGICAL) ×1 IMPLANT
GLOVE BIOGEL PI IND STRL 8 (GLOVE) ×1 IMPLANT
GLOVE BIOGEL PI IND STRL 9 (GLOVE) ×1 IMPLANT
GLOVE BIOGEL PI INDICATOR 8 (GLOVE) ×1
GLOVE BIOGEL PI INDICATOR 9 (GLOVE) ×1
GLOVE SURG SS PI 6.0 STRL IVOR (GLOVE) ×2 IMPLANT
GLOVE SURG SS PI 6.5 STRL IVOR (GLOVE) ×2 IMPLANT
GLOVE SURG SS PI 7.0 STRL IVOR (GLOVE) ×2 IMPLANT
GLOVE SURG SS PI 8.0 STRL IVOR (GLOVE) ×2 IMPLANT
GLOVE SURG SS PI 8.5 STRL IVOR (GLOVE) ×1
GLOVE SURG SS PI 8.5 STRL STRW (GLOVE) ×1 IMPLANT
GOWN STRL REUS W/ TWL LRG LVL3 (GOWN DISPOSABLE) ×1 IMPLANT
GOWN STRL REUS W/ TWL XL LVL3 (GOWN DISPOSABLE) ×2 IMPLANT
GOWN STRL REUS W/TWL LRG LVL3 (GOWN DISPOSABLE) ×1
GOWN STRL REUS W/TWL XL LVL3 (GOWN DISPOSABLE) ×2
HANDPIECE INTERPULSE COAX TIP (DISPOSABLE) ×1
HOOD PEEL AWAY FACE SHEILD DIS (HOOD) ×6 IMPLANT
KIT BASIN OR (CUSTOM PROCEDURE TRAY) ×2 IMPLANT
KIT ROOM TURNOVER OR (KITS) ×2 IMPLANT
MANIFOLD NEPTUNE II (INSTRUMENTS) ×2 IMPLANT
NEEDLE SPNL 18GX3.5 QUINCKE PK (NEEDLE) IMPLANT
NS IRRIG 1000ML POUR BTL (IV SOLUTION) ×2 IMPLANT
PACK TOTAL JOINT (CUSTOM PROCEDURE TRAY) ×2 IMPLANT
PAD ARMBOARD 7.5X6 YLW CONV (MISCELLANEOUS) ×4 IMPLANT
SET HNDPC FAN SPRY TIP SCT (DISPOSABLE) ×1 IMPLANT
SUT VIC AB 0 CT1 27 (SUTURE) ×1
SUT VIC AB 0 CT1 27XBRD ANBCTR (SUTURE) ×1 IMPLANT
SUT VIC AB 1 CTX 36 (SUTURE) ×1
SUT VIC AB 1 CTX36XBRD ANBCTR (SUTURE) ×1 IMPLANT
SUT VIC AB 2-0 CT1 27 (SUTURE)
SUT VIC AB 2-0 CT1 TAPERPNT 27 (SUTURE) IMPLANT
SUT VIC AB 3-0 CT1 27 (SUTURE) ×1
SUT VIC AB 3-0 CT1 TAPERPNT 27 (SUTURE) ×1 IMPLANT
SYR 50ML LL SCALE MARK (SYRINGE) ×2 IMPLANT
TOWEL OR 17X24 6PK STRL BLUE (TOWEL DISPOSABLE) ×2 IMPLANT
TOWEL OR 17X26 10 PK STRL BLUE (TOWEL DISPOSABLE) ×2 IMPLANT
TRAY CATH 16FR W/PLASTIC CATH (SET/KITS/TRAYS/PACK) ×2 IMPLANT

## 2015-11-12 NOTE — Transfer of Care (Signed)
Immediate Anesthesia Transfer of Care Note  Patient: Casey Liu  Procedure(s) Performed: Procedure(s): TOTAL KNEE ARTHROPLASTY (Right)  Patient Location: PACU  Anesthesia Type:Spinal and MAC combined with regional for post-op pain  Level of Consciousness: awake, oriented, patient cooperative and responds to stimulation  Airway & Oxygen Therapy: Patient Spontanous Breathing and Patient connected to nasal cannula oxygen  Post-op Assessment: Report given to RN, Post -op Vital signs reviewed and stable, Patient moving all extremities and Patient moving all extremities X 4  Post vital signs: Reviewed and stable  Last Vitals:  Filed Vitals:   11/12/15 0827  BP: 147/57  Pulse: 59  Temp: 36.4 C  Resp: 18    Complications: No apparent anesthesia complications

## 2015-11-12 NOTE — Progress Notes (Signed)
Orthopedic Tech Progress Note Patient Details:  Casey Liu 1935/04/29 [id number]  CPM Right Knee CPM Right Knee: On Right Knee Flexion (Degrees): 40 Right Knee Extension (Degrees): 0 Additional Comments: trapeze bar patient helper   Casey Liu 11/12/2015, 12:20 PM

## 2015-11-12 NOTE — Progress Notes (Signed)
Utilization review complete. Obrien Huskins RN CCM Case Mgmt phone [phone] 

## 2015-11-12 NOTE — Anesthesia Procedure Notes (Addendum)
Anesthesia Regional Block:  Adductor canal block  Pre-Anesthetic Checklist: ,, timeout performed, Correct Patient, Correct Site, Correct Laterality, Correct Procedure, Correct Position, site marked, Risks and benefits discussed,  Surgical consent,  Pre-op evaluation,  At surgeon's request and post-op pain management  Laterality: Right  Prep: chloraprep       Needles:  Injection technique: Single-shot  Needle Type: Echogenic Needle     Needle Length: 9cm 9 cm Needle Gauge: 21 and 21 G    Additional Needles:  Procedures: ultrasound guided (picture in chart) Adductor canal block Narrative:  Start time: 11/12/2015 9:10 AM End time: 11/12/2015 9:14 AM Injection made incrementally with aspirations every 5 mL.  Performed by: Personally  Anesthesiologist: Suella Broad D  Additional Notes: Pt tolerated well. No immediate complications noted.    Spinal Patient location during procedure: OR Start time: 11/12/2015 10:03 AM End time: 11/12/2015 1:06 AM Staffing Anesthesiologist: Suella Broad D Performed by: anesthesiologist  Preanesthetic Checklist Completed: patient identified, site marked, surgical consent, pre-op evaluation, timeout performed, IV checked, risks and benefits discussed and monitors and equipment checked Spinal Block Patient position: sitting Prep: Betadine Patient monitoring: heart rate, continuous pulse ox, blood pressure and cardiac monitor Approach: midline Location: L4-5 Injection technique: single-shot Needle Needle type: Introducer and Pencan  Needle gauge: 25 G Needle length: 9 cm Additional Notes Negative paresthesia. Negative blood return. Positive free-flowing CSF. Expiration date of kit checked and confirmed. Patient tolerated procedure well, without complications.    Procedure Name: MAC Date/Time: 11/12/2015 10:03 AM Performed by: Jacquiline Doe A Pre-anesthesia Checklist: Patient identified, Emergency Drugs available, Suction available,  Patient being monitored and Timeout performed Oxygen Delivery Method: Nasal cannula Placement Confirmation: positive ETCO2 Dental Injury: Teeth and Oropharynx as per pre-operative assessment

## 2015-11-12 NOTE — Anesthesia Preprocedure Evaluation (Addendum)
Anesthesia Evaluation  Patient identified by MRN, date of birth, ID band Patient awake    Reviewed: Allergy & Precautions, NPO status , Patient's Chart, lab work & pertinent test results, reviewed documented beta blocker date and time   Airway Mallampati: II  TM Distance: >3 FB Neck ROM: Full    Dental  (+) Dental Advisory Given   Pulmonary sleep apnea and Continuous Positive Airway Pressure Ventilation ,    breath sounds clear to auscultation       Cardiovascular hypertension, Pt. on medications and Pt. on home beta blockers  Rhythm:Regular Rate:Normal     Neuro/Psych  Headaches, negative psych ROS   GI/Hepatic Neg liver ROS, GERD  Medicated,  Endo/Other  diabetes, Type 2, Oral Hypoglycemic Agents  Renal/GU   negative genitourinary   Musculoskeletal  (+) Arthritis ,   Abdominal   Peds negative pediatric ROS (+)  Hematology negative hematology ROS (+)   Anesthesia Other Findings   Reproductive/Obstetrics negative OB ROS                           Anesthesia Physical Anesthesia Plan  ASA: II  Anesthesia Plan: Spinal   Post-op Pain Management:  Regional for Post-op pain   Induction: Intravenous  Airway Management Planned: Natural Airway and Simple Face Mask  Additional Equipment:   Intra-op Plan:   Post-operative Plan:   Informed Consent: I have reviewed the patients History and Physical, chart, labs and discussed the procedure including the risks, benefits and alternatives for the proposed anesthesia with the patient or authorized representative who has indicated his/her understanding and acceptance.   Dental advisory given and Consent reviewed with POA  Plan Discussed with: CRNA  Anesthesia Plan Comments:        Anesthesia Quick Evaluation

## 2015-11-12 NOTE — Addendum Note (Signed)
Addendum  created 11/12/15 1606 by Fidela Juneau, CRNA   Modules edited: Charges VN

## 2015-11-12 NOTE — Discharge Instructions (Signed)

## 2015-11-12 NOTE — Anesthesia Postprocedure Evaluation (Signed)
Anesthesia Post Note  Patient: Casey Liu  Procedure(s) Performed: Procedure(s) (LRB): TOTAL KNEE ARTHROPLASTY (Right)  Patient location during evaluation: PACU Anesthesia Type: Spinal and Regional Level of consciousness: oriented and awake and alert Pain management: pain level controlled Vital Signs Assessment: post-procedure vital signs reviewed and stable Respiratory status: spontaneous breathing, respiratory function stable and patient connected to nasal cannula oxygen Cardiovascular status: blood pressure returned to baseline and stable Postop Assessment: no headache and no backache Anesthetic complications: no    Last Vitals:  Filed Vitals:   11/12/15 1313 11/12/15 1314  BP: 153/74   Pulse: 55   Temp:  36.1 C  Resp: 14     Last Pain:  Filed Vitals:   11/12/15 1320  PainSc: Asleep                 Effie Berkshire

## 2015-11-12 NOTE — Progress Notes (Signed)
Orthopedic Tech Progress Note Patient Details:  Casey Liu 22-Nov-1934 [id number] Ortho visit put on cpm at 1900 Patient ID: Casey Liu, female   DOB: October 22, 1934, 81 y.o.   MRN: [id number]   Casey Liu 11/12/2015, 7:03 PM

## 2015-11-12 NOTE — Op Note (Signed)
PATIENT ID:      Casey Liu  MRN:     [id number] DOB/AGE:    September 06, 1934 / 81 y.o.       OPERATIVE REPORT    DATE OF PROCEDURE:  11/12/2015       PREOPERATIVE DIAGNOSIS:   RIGHT KNEE OSTEOARTHRITIS      Estimated body mass index is 29.29 kg/(m^2) as calculated from the following:   Height as of this encounter: 5\' 5"  (1.651 m).   Weight as of this encounter: 79.833 kg (176 lb).                                                        POSTOPERATIVE DIAGNOSIS:   RIGHT KNEE OSTEOARTHRITIS                                                                      PROCEDURE:  Procedure(s): TOTAL KNEE ARTHROPLASTY Using DepuyAttune RP implants #5R Femur, #5Tibia, 6 mm Attune RP bearing, 35 Patella     SURGEON: [place] Latulippe J    ASSISTANT:   [employer]   (Present and scrubbed throughout the case, critical for assistance with exposure, retraction, instrumentation, and closure.)         ANESTHESIA: Spinal, 20cc Exparel, 20cc 0.5% Marcaine  EBL: 300  FLUID REPLACEMENT: 1600 crystalloid  TOURNIQUET TIME: 21min  Drains: None  Tranexamic Acid: 1gm iv   COMPLICATIONS:  None         INDICATIONS FOR PROCEDURE: The patient has  RIGHT KNEE OSTEOARTHRITIS, Var deformities, XR shows bone on bone arthritis, lateral subluxation of tibia. Patient has failed all conservative measures including anti-inflammatory medicines, narcotics, attempts at  exercise and weight loss, cortisone injections and viscosupplementation.  Risks and benefits of surgery have been discussed, questions answered.   DESCRIPTION OF PROCEDURE: The patient identified by armband, received  IV antibiotics, in the holding area at [facility]. Patient taken to the operating room, appropriate anesthetic  monitors were attached, and Spinal anesthesia was  induced. Tourniquet  applied high to the operative thigh. Lateral post and foot positioner  applied to the table, the lower extremity was then prepped and draped  in  usual sterile fashion from the toes to the tourniquet. Time-out procedure was performed. We began the operation, with the knee flexed 120 degrees, by making the anterior midline incision starting at handbreadth above the patella going over the patella 1 cm medial to and 4 cm distal to the tibial tubercle. Small bleeders in the skin and the  subcutaneous tissue identified and cauterized. Transverse retinaculum was incised and reflected medially and a medial parapatellar arthrotomy was accomplished. the patella was everted and theprepatellar fat pad resected. The superficial medial collateral  ligament was then elevated from anterior to posterior along the proximal  flare of the tibia and anterior half of the menisci resected. The knee was hyperflexed exposing bone on bone arthritis. Peripheral and notch osteophytes as well as the cruciate ligaments were then resected. We continued to  work our way around posteriorly along the proximal tibia, and externally  rotated the tibia subluxing it out from underneath the femur. A McHale  retractor was placed through the notch and a lateral Hohmann retractor  placed, and we then drilled through the proximal tibia in line with the  axis of the tibia followed by an intramedullary guide rod and 2-degree  posterior slope cutting guide. The tibial cutting guide, 3 degree posterior sloped, was pinned into place allowing resection of 3 mm of bone medially and 10 mm of bone laterally. Satisfied with the tibial resection, we then  entered the distal femur 2 mm anterior to the PCL origin with the  intramedullary guide rod and applied the distal femoral cutting guide  set at 9 mm, with 5 degrees of valgus. This was pinned along the  epicondylar axis. At this point, the distal femoral cut was accomplished without difficulty. We then sized for a #5R femoral component and pinned the guide in 3 degrees of external rotation. The chamfer cutting guide was pinned into place. The  anterior, posterior, and chamfer cuts were accomplished without difficulty followed by  the Attune RP box cutting guide and the box cut. We also removed posterior osteophytes from the posterior femoral condyles. At this  time, the knee was brought into full extension. We checked our  extension and flexion gaps and found them symmetric for a 6 mm bearing. Distracting in extension with a lamina spreader, the posterior horns of the menisci were removed, and Exparel, diluted to 60 cc, with 20cc NS, and 20cc 0.5% Marcaine,was injected into the capsule and synovium of the knee. The posterior patella cut was accomplished with the 9.5 mm Attune cutting guide, sized for a 40mm dome, and the fixation pegs drilled.The knee  was then once again hyperflexed exposing the proximal tibia. We sized for a # 5 tibial base plate, applied the smokestack and the conical reamer followed by the the Delta fin keel punch. We then hammered into place the Attune RP trial femoral component, drilled the lugs, inserted a  6 mm trial bearing, trial patellar button, and took the knee through range of motion from 0-130 degrees. No thumb pressure was required for patellar Tracking. At this point, the limb was wrapped with an Esmarch bandage and the tourniquet inflated to 350 mmHg. All trial components were removed, mating surfaces irrigated with pulse lavage, and dried with suction and sponges. A double batch of DePuy HV cement with 1500 mg of Zinacef was mixed and applied to all bony metallic mating surfaces except for the posterior condyles of the femur itself. In order, we  hammered into place the tibial tray and removed excess cement, the femoral component and removed excess cement. The final Attune RP bearing  was inserted, and the knee brought to full extension with compression.  The patellar button was clamped into place, and excess cement  removed. While the cement cured the wound was irrigated out with normal saline solution pulse  lavage. Ligament stability and patellar tracking were checked and found to be excellent. The parapatellar arthrotomy was closed with  running #1 Vicryl suture. The subcutaneous tissue with 0 and 2-0 undyed  Vicryl suture, and the skin with running 3-0 SQ vicryl. A dressing of Xeroform,  4 x 4, dressing sponges, Webril, and Ace wrap applied. The patient  awakened, and taken to recovery room without difficulty.   Khoury Siemon J 11/12/2015, 11:23 AM

## 2015-11-12 NOTE — Interval H&P Note (Signed)
History and Physical Interval Note:  11/12/2015 9:00 AM  Casey Liu  has presented today for surgery, with the diagnosis of RIGHT KNEE OSTEOARTHRITIS  The various methods of treatment have been discussed with the patient and family. After consideration of risks, benefits and other options for treatment, the patient has consented to  Procedure(s): TOTAL KNEE ARTHROPLASTY (Right) as a surgical intervention .  The patient's history has been reviewed, patient examined, no change in status, stable for surgery.  I have reviewed the patient's chart and labs.  Questions were answered to the patient's satisfaction.     Kerin Salen

## 2015-11-13 ENCOUNTER — Encounter (HOSPITAL_COMMUNITY): Payer: Self-pay | Admitting: Orthopedic Surgery

## 2015-11-13 LAB — CBC
HCT: 35.9 % — ABNORMAL LOW (ref 36.0–46.0)
Hemoglobin: 11.3 g/dL — ABNORMAL LOW (ref 12.0–15.0)
MCH: 28 pg (ref 26.0–34.0)
MCHC: 31.5 g/dL (ref 30.0–36.0)
MCV: 88.9 fL (ref 78.0–100.0)
Platelets: 221 10*3/uL (ref 150–400)
RBC: 4.04 MIL/uL (ref 3.87–5.11)
RDW: 15.1 % (ref 11.5–15.5)
WBC: 7.4 10*3/uL (ref 4.0–10.5)

## 2015-11-13 LAB — GLUCOSE, CAPILLARY
Glucose-Capillary: 123 mg/dL — ABNORMAL HIGH (ref 65–99)
Glucose-Capillary: 159 mg/dL — ABNORMAL HIGH (ref 65–99)
Glucose-Capillary: 162 mg/dL — ABNORMAL HIGH (ref 65–99)
Glucose-Capillary: 118 mg/dL — ABNORMAL HIGH (ref 65–99)

## 2015-11-13 LAB — BASIC METABOLIC PANEL
Anion gap: 7 (ref 5–15)
BUN: 11 mg/dL (ref 6–20)
CO2: 22 mmol/L (ref 22–32)
Calcium: 8.5 mg/dL — ABNORMAL LOW (ref 8.9–10.3)
Chloride: 104 mmol/L (ref 101–111)
Creatinine, Ser: 0.89 mg/dL (ref 0.44–1.00)
GFR calc Af Amer: >60 mL/min (ref >60)
GFR calc non Af Amer: >60 mL/min (ref >60)
Glucose, Bld: 136 mg/dL — ABNORMAL HIGH (ref 65–99)
Potassium: 4.3 mmol/L (ref 3.5–5.1)
Sodium: 133 mmol/L — ABNORMAL LOW (ref 135–145)

## 2015-11-13 LAB — HEMOGLOBIN A1C
Hgb A1c MFr Bld: 6.4 % — ABNORMAL HIGH (ref 4.8–5.6)
Mean Plasma Glucose: 137 mg/dL

## 2015-11-13 NOTE — Evaluation (Signed)
Physical Therapy Evaluation Patient Details Name: Casey Liu MRN: [id number] DOB: 1934-11-06 Today's Date: 11/13/2015   History of Present Illness  81 yo Spanish speaking female admitted for right TKA. PMhx: HTN, DM, sleep apnea  Clinical Impression  Pt pleasant with son present throughout to interpret and assist with PLOF. Pt and son educated for bone foam, CPM, HEP, plan and progression. Pt with decreased ROM, strength, function and gait who will benefit from acute therapy to maximize mobility, function, gait and independence to decrease burden of care.     Follow Up Recommendations Home health PT    Equipment Recommendations  Rolling walker with 5" wheels    Recommendations for Other Services OT consult     Precautions / Restrictions Precautions Precautions: Fall;Knee Restrictions Weight Bearing Restrictions: Yes RLE Weight Bearing: Weight bearing as tolerated      Mobility  Bed Mobility Overal bed mobility: Modified Independent             General bed mobility comments: HOB grossly 20degrees  Transfers Overall transfer level: Needs assistance Equipment used: None Transfers: Sit to/from Stand Sit to Stand: Supervision         General transfer comment: cues for hand placement and safety  Ambulation/Gait Ambulation/Gait assistance: Min guard Ambulation Distance (Feet): 250 Feet Assistive device: Rolling walker (2 wheeled) Gait Pattern/deviations: Step-through pattern;Decreased stride length;Decreased dorsiflexion - left;Decreased dorsiflexion - right   Gait velocity interpretation: Below normal speed for age/gender General Gait Details: cues for increased dorsiflexion bil   Stairs            Wheelchair Mobility    Modified Rankin (Stroke Patients Only)       Balance Overall balance assessment: No apparent balance deficits (not formally assessed)                                           Pertinent Vitals/Pain  Pain Assessment: No/denies pain    Home Living Family/patient expects to be discharged to:: Private residence Living Arrangements: Other relatives Available Help at Discharge: Family;Available 24 hours/day Type of Home: House Home Access: Level entry     Home Layout: Two level;Able to live on main level with bedroom/bathroom Home Equipment: Walker - standard;Bedside commode      Prior Function Level of Independence: Independent               Hand Dominance        Extremity/Trunk Assessment   Upper Extremity Assessment: Overall WFL for tasks assessed           Lower Extremity Assessment: RLE deficits/detail RLE Deficits / Details: decreased strength and ROM as expected post op    Cervical / Trunk Assessment: Normal  Communication   Communication: Prefers language other than English  Cognition Arousal/Alertness: Awake/alert Behavior During Therapy: WFL for tasks assessed/performed Overall Cognitive Status: Within Functional Limits for tasks assessed                      General Comments      Exercises Total Joint Exercises Quad Sets: AROM;Right;5 reps;Supine Heel Slides: AAROM;Right;5 reps;Supine Hip ABduction/ADduction: AAROM;Right;5 reps;Supine Straight Leg Raises: AAROM;Right;5 reps;Supine Goniometric ROM: 8-51      Assessment/Plan    PT Assessment Patient needs continued PT services  PT Diagnosis Difficulty walking;Generalized weakness   PT Problem List Decreased strength;Decreased range of motion;Decreased activity tolerance;Decreased mobility;Pain;Decreased knowledge of  use of DME  PT Treatment Interventions Functional mobility training;Therapeutic activities;Therapeutic exercise;Patient/family education;Gait training;DME instruction   PT Goals (Current goals can be found in the Care Plan section) Acute Rehab PT Goals Patient Stated Goal: return home and watch tv PT Goal Formulation: With patient/family Time For Goal Achievement:  11/20/15 Potential to Achieve Goals: Good    Frequency 7X/week   Barriers to discharge        Co-evaluation               End of Session Equipment Utilized During Treatment: Gait belt Activity Tolerance: Patient tolerated treatment well Patient left: in chair;with call bell/phone within reach;with chair alarm set;with family/visitor present Nurse Communication: Mobility status;Precautions;Weight bearing status         Time: 1131-1201 PT Time Calculation (min) (ACUTE ONLY): 30 min   Charges:   PT Evaluation $PT Eval Moderate Complexity: 1 Procedure PT Treatments $Gait Training: 8-22 mins   PT G CodesMelford Aase 11/13/2015, 12:15 PM Elwyn Reach, [place]

## 2015-11-13 NOTE — Progress Notes (Signed)
Occupational Therapy Evaluation Patient Details Name: Casey Liu MRN: [id number] DOB: 08-15-1934 Today's Date: 11/13/2015    History of Present Illness 81 yo Spanish speaking female admitted for right TKA. PMhx: HTN, DM, sleep apnea   Clinical Impression   PTA, pt was independent with ADL and mobility. Pt currently requires min A for mobility and ADL. Will follow to complete family education for compensatory techniques for ADL and functional mobility to facilitate safe D/C home with 24/7 S.     Follow Up Recommendations  No OT follow up;Supervision/Assistance - 24 hour    Equipment Recommendations  None recommended by OT    Recommendations for Other Services       Precautions / Restrictions Precautions Precautions: Fall;Knee Restrictions Weight Bearing Restrictions: Yes RLE Weight Bearing: Weight bearing as tolerated      Mobility Bed Mobility Overal bed mobility: Modified Independent             General bed mobility comments: OOB in chair  Transfers Overall transfer level: Needs assistance Equipment used: Rolling walker (2 wheeled) Transfers: Sit to/from [facility] Sit to Stand: Min assist (uncontrolled descent) Stand pivot transfers: Min guard       General transfer comment: cues for hand placement. Cues for sequencing    Balance Overall balance assessment: No apparent balance deficits (not formally assessed)                                          ADL Overall ADL's : Needs assistance/impaired     Grooming: Set up   Upper Body Bathing: Set up;Sitting   Lower Body Bathing: Minimal assistance;Sit to/from stand   Upper Body Dressing : Set up;Sitting   Lower Body Dressing: Moderate assistance;Sit to/from stand   Toilet Transfer: Minimal assistance;RW;BSC;Ambulation   Toileting- Water quality scientist and Hygiene: Min guard;Sit to/from stand       Functional mobility during ADLs: Minimal  assistance;Rolling walker;Cueing for safety;Cueing for sequencing General ADL Comments: Family states pt will have 24/7 S. States "she has to be pushed". Began educating son on shower transfer technique.      Vision     Perception     Praxis      Pertinent Vitals/Pain Pain Assessment: 0-10 Pain Score: 8  Pain Location: R knee Pain Descriptors / Indicators: Aching;Grimacing Pain Intervention(s): Limited activity within patient's tolerance;Repositioned;Ice applied     Hand Dominance     Extremity/Trunk Assessment Upper Extremity Assessment Upper Extremity Assessment: Overall WFL for tasks assessed   Lower Extremity Assessment Lower Extremity Assessment: Defer to PT evaluation RLE Deficits / Details: decreased strength and ROM as expected post op   Cervical / Trunk Assessment Cervical / Trunk Assessment: Normal   Communication Communication Communication: Prefers language other than English   Cognition Arousal/Alertness: Awake/alert Behavior During Therapy: WFL for tasks assessed/performed Overall Cognitive Status: Within Functional Limits for tasks assessed                     General Comments       Exercises       Shoulder Instructions      Home Living Family/patient expects to be discharged to:: Private residence Living Arrangements: Other relatives Available Help at Discharge: Family;Available 24 hours/day Type of Home: House Home Access: Level entry     Home Layout: Two level;Able to live on main level with bedroom/bathroom  Bathroom Shower/Tub: Gaffer;Tub/shower unit   Bathroom Toilet: Standard Bathroom Accessibility:  (son thinks she can go sideways)   Home Equipment: Walker - standard;Bedside commode          Prior Functioning/Environment Level of Independence: Independent             OT Diagnosis: Generalized weakness;Acute pain   OT Problem List: Decreased strength;Decreased range of motion;Decreased activity  tolerance;Decreased safety awareness;Decreased knowledge of use of DME or AE;Obesity;Pain   OT Treatment/Interventions: Self-care/ADL training;DME and/or AE instruction;Therapeutic activities;Patient/family education    OT Goals(Current goals can be found in the care plan section) Acute Rehab OT Goals Patient Stated Goal: return home and watch tv OT Goal Formulation: With patient Time For Goal Achievement: 11/20/15 Potential to Achieve Goals: Good  OT Frequency: Min 2X/week   Barriers to D/C:            Co-evaluation              End of Session Equipment Utilized During Treatment: Gait belt;Rolling walker CPM Right Knee CPM Right Knee: Off Nurse Communication: Mobility status  Activity Tolerance: Patient tolerated treatment well Patient left: in chair;with call bell/phone within reach;with family/visitor present   Time: [id number] OT Time Calculation (min): 21 min Charges:  OT General Charges $OT Visit: 1 Procedure OT Evaluation $OT Eval Moderate Complexity: 1 Procedure G-Codes:    Casey Liu,HILLARY 08-Dec-2015, 2:19 PM   [facility], OTR/L  [phone] Dec 08, 2015

## 2015-11-13 NOTE — Progress Notes (Signed)
OT Cancellation Note  Patient Details Name: Casey Liu MRN: [id number] DOB: 1935/06/22   Cancelled Treatment:    Reason Eval/Treat Not Completed: Other (comment) Pt with n/v. Will attempt later.  [place], OTR/L  [id number] 11/13/2015 11/13/2015, 9:11 AM

## 2015-11-13 NOTE — Progress Notes (Addendum)
PT Cancellation Note  Patient Details Name: Casey Liu MRN: [id number] DOB: [birth year]   Cancelled Treatment:    Reason Eval/Treat Not Completed: Other (comment) (pt currently nauseated and vomiting despite medication. will attempt again later this am) 0750 Attempted again at 0910 and pt again vomited with deferral this morning for exercise and mobility. Will attempt in PM.   Lanetta Inch [facility] 11/13/2015, 7:51 AM Elwyn Reach, [place]

## 2015-11-13 NOTE — Progress Notes (Signed)
Patient ID: Casey Liu, female   DOB: 1934-10-01, 81 y.o.   MRN: [id number] PATIENT ID: Casey Liu  MRN: [id number]  DOB/AGE:  [birth year] / 81 y.o.  1 Day Post-Op Procedure(s) (LRB): TOTAL KNEE ARTHROPLASTY (Right)    PROGRESS NOTE Subjective: Patient is alert, oriented, x1 Nausea, x1 Vomiting, yes passing gas. Taking PO well. Denies SOB, Chest or Calf Pain. Using Incentive Spirometer, PAS in place. Ambulate WBAT, CPM 0-40 Patient reports pain as 4/10, patient was seen with her daughter, who is a CNA at her bedside and acted as Astronomer. .    Objective: Vital signs in last 24 hours: Filed Vitals:   11/12/15 1313 11/12/15 1314 11/12/15 2100 11/13/15 0500  BP: 153/74  145/59 135/59  Pulse: 55  70 76  Temp:  97 F (36.1 C) 98.9 F (37.2 C) 98.6 F (37 C)  TempSrc:   Oral Oral  Resp: 14  16 16   Height:      Weight:      SpO2: 99%  93% 90%      Intake/Output from previous day: I/O last 3 completed shifts: In: 1742.5 [P.O.:30; I.V.:1712.5] Out: 200 [Urine:150; Blood:50]   Intake/Output this shift:     LABORATORY DATA:  Recent Labs  11/12/15 1833 11/12/15 2139 11/13/15 0302 11/13/15 0622  WBC  --   --  7.4  --   HGB  --   --  11.3*  --   HCT  --   --  35.9*  --   PLT  --   --  221  --   NA  --   --  133*  --   K  --   --  4.3  --   CL  --   --  104  --   CO2  --   --  22  --   BUN  --   --  11  --   CREATININE  --   --  0.89  --   GLUCOSE  --   --  136*  --   GLUCAP 164* 135*  --  123*  CALCIUM  --   --  8.5*  --     Examination: Neurologically intact ABD soft Neurovascular intact Sensation intact distally Intact pulses distally Dorsiflexion/Plantar flexion intact Incision: dressing C/D/I No cellulitis present Compartment soft}  Assessment:   1 Day Post-Op Procedure(s) (LRB): TOTAL KNEE ARTHROPLASTY (Right) ADDITIONAL DIAGNOSIS: Expected Acute Blood Loss Anemia, Diabetes and Hypertension  Plan: PT/OT WBAT, CPM 5/hrs day  until ROM 0-90 degrees, then D/C CPM DVT Prophylaxis:  SCDx72hrs, ASA 325 mg BID x 2 weeks DISCHARGE PLAN: Home, probably in one to 2 days there are no stairs at the house and she has 3 family members to help when she passes PT goals DISCHARGE NEEDS: HHPT, CPM, Walker and 3-in-1 comode seat     Jeremie Abdelaziz J 11/13/2015, 7:47 AM

## 2015-11-13 NOTE — Care Management Note (Signed)
Case Management Note  Patient Details  Name: Casey Liu MRN: [id number] Date of Birth: 1934/10/13  Subjective/Objective: 81 yr old female s/p right total knee arthroplasty.                   Action/Plan:  Patient speaks spanish only. Has interpreter. PAtient was setup with [facility] prior to surgery. No changes. Has 3in1, rolling walker ordered. CPM to be delivered. Has family support at discharge.   Expected Discharge Date:   11/14/15               Expected Discharge Plan:  [place]  In-House Referral:  NA  Discharge planning Services  CM Consult  Post Acute Care Choice:  Home Health, Durable Medical Equipment Choice offered to:  Patient  DME Arranged:  Walker rolling DME Agency:  [place]:  PT [facility] Agency:  [place]  Status of Service:  Completed, signed off  Medicare Important Message Given:    Date Medicare IM Given:    Medicare IM give by:    Date Additional Medicare IM Given:    Additional Medicare Important Message give by:     If discussed at [place] of Stay Meetings, dates discussed:    Additional Comments:  Ninfa Meeker, RN 11/13/2015, 10:40 AM

## 2015-11-14 LAB — CBC
HCT: 33.2 % — ABNORMAL LOW (ref 36.0–46.0)
Hemoglobin: 10.6 g/dL — ABNORMAL LOW (ref 12.0–15.0)
MCH: 29 pg (ref 26.0–34.0)
MCHC: 31.9 g/dL (ref 30.0–36.0)
MCV: 91 fL (ref 78.0–100.0)
Platelets: 174 10*3/uL (ref 150–400)
RBC: 3.65 MIL/uL — ABNORMAL LOW (ref 3.87–5.11)
RDW: 15.3 % (ref 11.5–15.5)
WBC: 10.7 10*3/uL — ABNORMAL HIGH (ref 4.0–10.5)

## 2015-11-14 LAB — GLUCOSE, CAPILLARY
Glucose-Capillary: 115 mg/dL — ABNORMAL HIGH (ref 65–99)
Glucose-Capillary: 115 mg/dL — ABNORMAL HIGH (ref 65–99)

## 2015-11-14 NOTE — Discharge Summary (Signed)
Patient ID: Casey Liu MRN: [id number] DOB/AGE: 22-Oct-1934 81 y.o.  Admit date: 11/12/2015 Discharge date: 11/14/2015  Admission Diagnoses:  Principal Problem:   Primary osteoarthritis of right knee   Discharge Diagnoses:  Same  Past Medical History  Diagnosis Date  . Family history of anesthesia complication     mother had "three heart attacks after anesthesia"  . Seasonal allergies   . Pneumonia     hx of   . Diabetes mellitus without complication ([place])   . Urinary tract infection     hx of  . Headache(784.0)     hx of  . Arthritis   . History of stomach ulcers   . Cataract     right  . Hypertension     Dr. Jonelle Sidle [phone]  . Sleep apnea     cpap    Surgeries: Procedure(s): TOTAL KNEE ARTHROPLASTY on 11/12/2015   Consultants:    Discharged Condition: Improved  Hospital Course: Zolah Rozak is an 81 y.o. female who was admitted 11/12/2015 for operative treatment ofPrimary osteoarthritis of right knee. Patient has severe unremitting pain that affects sleep, daily activities, and work/hobbies. After pre-op clearance the patient was taken to the operating room on 11/12/2015 and underwent  Procedure(s): TOTAL KNEE ARTHROPLASTY.    Patient was given perioperative antibiotics: Anti-infectives    Start     Dose/Rate Route Frequency Ordered Stop   11/12/15 1041  cefUROXime (ZINACEF) injection  Status:  Discontinued       As needed 11/12/15 1041 11/12/15 1150   11/12/15 0900  ceFAZolin (ANCEF) 3 g in dextrose 5 % 50 mL IVPB     3 g 130 mL/hr over 30 Minutes Intravenous On call to O.R. 11/11/15 0730 11/12/15 1010       Patient was given sequential compression devices, early ambulation, and chemoprophylaxis to prevent DVT.  Patient benefited maximally from hospital stay and there were no complications.    Recent vital signs: Patient Vitals for the past 24 hrs:  BP Temp Temp src Pulse Resp SpO2  11/14/15 0300 (!) 139/48 mmHg 99.3 F (37.4 C)  Oral 69 16 -  11/13/15 2300 115/72 mmHg 98.1 F (36.7 C) Oral 72 16 90 %  11/13/15 1300 138/60 mmHg 98.6 F (37 C) Oral 65 16 93 %     Recent laboratory studies:  Recent Labs  11/13/15 0302 11/14/15 0408  WBC 7.4 10.7*  HGB 11.3* 10.6*  HCT 35.9* 33.2*  PLT 221 174  NA 133*  --   K 4.3  --   CL 104  --   CO2 22  --   BUN 11  --   CREATININE 0.89  --   GLUCOSE 136*  --   CALCIUM 8.5*  --      Discharge Medications:     Medication List    STOP taking these medications        traMADol 50 MG tablet  Commonly known as:  ULTRAM      TAKE these medications        aspirin EC 325 MG tablet  Take 1 tablet (325 mg total) by mouth 2 (two) times daily.     atenolol 100 MG tablet  Commonly known as:  TENORMIN  Take 100 mg by mouth daily.     COMBIGAN 0.2-0.5 % ophthalmic solution  Generic drug:  brimonidine-timolol  Place 1 drop into both eyes every 12 (twelve) hours.     esomeprazole 40 MG capsule  Commonly known as:  NEXIUM  Take 40 mg by mouth daily before breakfast.     fluticasone 50 MCG/ACT nasal spray  Commonly known as:  FLONASE  Place into both nostrils daily.     lisinopril 20 MG tablet  Commonly known as:  PRINIVIL,ZESTRIL  Take 20 mg by mouth daily.     loteprednol 0.5 % ophthalmic suspension  Commonly known as:  LOTEMAX  Place 1 drop into both eyes daily.     metFORMIN 500 MG tablet  Commonly known as:  GLUCOPHAGE  Take 500 mg by mouth 2 (two) times daily with a meal.     NIFEdipine 90 MG 24 hr tablet  Commonly known as:  ADALAT CC  Take 90 mg by mouth daily.     oxyCODONE-acetaminophen 5-325 MG tablet  Commonly known as:  ROXICET  Take 1 tablet by mouth every 4 (four) hours as needed.     tizanidine 2 MG capsule  Commonly known as:  ZANAFLEX  Take 1 capsule (2 mg total) by mouth 3 (three) times daily.        Diagnostic Studies: Dg Chest 2 View  11/05/2015  CLINICAL DATA:  Preoperative exam prior to right knee replacement on April  24th; patient reports cough associated with seasonal allergies; diabetes, hypertension, nonsmoker. EXAM: CHEST  2 VIEW COMPARISON:  PA and lateral chest x-ray of July 05, 2012 FINDINGS: The lungs are reasonably well inflated. The interstitial markings are slightly more conspicuous bilaterally than on the previous study. There is increased density in the right lower lobe. The cardiac silhouette is enlarged and more conspicuous today. The central pulmonary vascularity on the left is mildly prominent. There is tortuosity of the descending thoracic aorta. There is gentle dextrocurvature of the mid thoracic spine. There is mild multilevel degenerative disc space narrowing of the thoracic spine. IMPRESSION: Cardiomegaly with mild interstitial prominence bilaterally suspicious for low-grade CHF in the appropriate clinical setting. Probable right lower lobe atelectasis or pneumonia. Correlation with the patient's clinical examination would be useful. Electronically Signed   By: David  Martinique M.D.   On: 11/05/2015 15:38    Disposition: 01-Home or Self Care      Discharge Instructions    CPM    Complete by:  As directed   Continuous passive motion machine (CPM):      Use the CPM from 0 to 60  for 5 hours per day.      You may increase by 10 degrees per day.  You may break it up into 2 or 3 sessions per day.      Use CPM for 2 weeks or until you are told to stop.     Call MD / Call 911    Complete by:  As directed   If you experience chest pain or shortness of breath, CALL 911 and be transported to the hospital emergency room.  If you develope a fever above 101 F, pus (white drainage) or increased drainage or redness at the wound, or calf pain, call your surgeon's office.     Constipation Prevention    Complete by:  As directed   Drink plenty of fluids.  Prune juice may be helpful.  You may use a stool softener, such as Colace (over the counter) 100 mg twice a day.  Use MiraLax (over the counter) for  constipation as needed.     Diet - low sodium heart healthy    Complete by:  As directed      Driving restrictions    Complete  by:  As directed   No driving for 2 weeks     Increase activity slowly as tolerated    Complete by:  As directed      Patient may shower    Complete by:  As directed   You may shower without a dressing once there is no drainage.  Do not wash over the wound.  If drainage remains, cover wound with plastic wrap and then shower.           Follow-up Information    Follow up with Kerin Salen, MD In 2 weeks.   Specialty:  Orthopedic Surgery   Contact information:   [place] [address] [phone]       Follow up with [facility].   Why:  Someone from [facility] will contact you concerning start date and time for therapy.   Contact information:   [address] [address] [place] [address] [phone]        Signed: Hardin Negus, Garnell Phenix R 11/14/2015, 7:52 AM

## 2015-11-14 NOTE — Progress Notes (Signed)
OT Note  Completed all education. Stressed importance of pt having S with all mobility and ADL due to cues for safety. Family verbalized understanding.     11/14/15 1138  OT Visit Information  Last OT Received On 11/14/15  Assistance Needed +1  History of Present Illness 81 yo Spanish speaking female admitted for right TKA. PMhx: HTN, DM, sleep apnea  Precautions  Precautions Fall;Knee  Pain Assessment  Pain Assessment Faces  Faces Pain Scale 4  Pain Location R knee  Pain Descriptors / Indicators Grimacing;Discomfort  Pain Intervention(s) Limited activity within patient's tolerance  Cognition  Arousal/Alertness Awake/alert  Behavior During Therapy WFL for tasks assessed/performed  Overall Cognitive Status Within Functional Limits for tasks assessed  ADL  Overall ADL's  Needs assistance/impaired  Lower Body Dressing Minimal assistance;Sit to/from stand  Lower Body Dressing Details (indicate cue type and reason) Educated pt/family on compensatory techniques for LB ADL  Toilet Transfer Minimal assistance;RW;Ambulation;Comfort height toilet  Toileting- Clothing Manipulation and Hygiene Min guard  Toileting - Clothing Manipulation Details (indicate cue type and reason) Educated pt on need to back up to toilet before attempting to sit from the side and swing her bottom around and not reaching back for commode. Pt trying to pull up on RW. Educated on proper hand positioning.  Tub/Shower Transfer Details (indicate cue type and reason) Family has roll in shower. Educated family on need to provide assistance with all mobility and ADL.  Functional mobility during ADLs Rolling walker;Cueing for safety;Min guard  General ADL Comments Stressed importance of S for all mobility aqnd ADL. Educated on need to use 3in1 for shower and for pt not to stand. Educated family on home safety and reducing risk of falls. Family bverbalized understanding.  Bed Mobility  Overal bed mobility Modified Independent   General bed mobility comments OOB in chair  Balance  Overall balance assessment Needs assistance  Standing balance-Leahy Scale Fair  Restrictions  RLE Weight Bearing WBAT  Transfers  Overall transfer level Needs assistance  Equipment used Rolling walker (2 wheeled)  Transfers Sit to/from Stand;Stand Pivot Transfers  Sit to Stand Min assist  Stand pivot transfers Min guard  General transfer comment vc for safety  OT - End of Session  Equipment Utilized During Treatment Gait belt;Rolling walker  Activity Tolerance Patient tolerated treatment well  Patient left in chair;with call bell/phone within reach;with family/visitor present  Nurse Communication Mobility status  OT Assessment/Plan  OT Plan Discharge plan remains appropriate  OT Frequency (ACUTE ONLY) Min 2X/week  Follow Up Recommendations No OT follow up;Supervision/Assistance - 24 hour  OT Equipment None recommended by OT  OT Goal Progression  Progress towards OT goals Goals met/education completed, patient discharged from OT  Acute Rehab OT Goals  Patient Stated Goal return home and watch tv  OT Goal Formulation With patient  Time For Goal Achievement 11/20/15  Potential to Achieve Goals Good  ADL Goals  Pt Will Perform Lower Body Dressing with caregiver independent in assisting;sit to/from stand;with min guard assist  Pt Will Transfer to Toilet with supervision;bedside commode;ambulating  Pt Will Perform Tub/Shower Transfer 3 in 1;ambulating;Shower transfer;with caregiver independent in assisting;with min guard assist  OT Time Calculation  OT Start Time (ACUTE ONLY) 0946  OT Stop Time (ACUTE ONLY) 1003  OT Time Calculation (min) 17 min  OT General Charges  $OT Visit 1 Procedure  OT Treatments  $Self Care/Home Management  8-22 mins   [facility], OTR/L  [phone] 11/14/2015

## 2015-11-14 NOTE — Progress Notes (Signed)
Physical Therapy Treatment Patient Details Name: Casey Liu MRN: [id number] DOB: 1935/06/12 Today's Date: 11/14/2015    History of Present Illness 81 yo Spanish speaking female admitted for right TKA. PMhx: HTN, DM, sleep apnea    PT Comments    Pt progressing with mobility but requires much encouragement to mobilize, time spent educating pt/ sons on importance of ambulation and exercises at home. Pt ambulated 250' with RW and supervision. PT will continue to follow.   Follow Up Recommendations  Home health PT     Equipment Recommendations  Rolling walker with 5" wheels    Recommendations for Other Services OT consult     Precautions / Restrictions Precautions Precautions: Fall;Knee Precaution Comments: reviewed proper positioning Restrictions Weight Bearing Restrictions: Yes RLE Weight Bearing: Weight bearing as tolerated    Mobility  Bed Mobility               General bed mobility comments: OOB in chair  Transfers Overall transfer level: Needs assistance Equipment used: Rolling walker (2 wheeled) Transfers: Sit to/from Stand Sit to Stand: Min assist         General transfer comment: vc's for hand placement and extra time given to allow her as much independence as possible but she still required min A for last 20%. vc's for controlling sitting which she did  Ambulation/Gait Ambulation/Gait assistance: Supervision Ambulation Distance (Feet): 250 Feet Assistive device: Rolling walker (2 wheeled) Gait Pattern/deviations: Step-through pattern;Antalgic Gait velocity: decreased Gait velocity interpretation: Below normal speed for age/gender General Gait Details: pt resistant to mobilizing, sons report that she sits all day at home, discussed appropriate activity level upon d/c as well as need for compliance with exercise.    Stairs            Wheelchair Mobility    Modified Rankin (Stroke Patients Only)       Balance Overall balance  assessment: Needs assistance Sitting-balance support: No upper extremity supported Sitting balance-Leahy Scale: Good     Standing balance support: Bilateral upper extremity supported Standing balance-Leahy Scale: Poor Standing balance comment: continued reliance on RW in standing as well as overall slow movements, would expect her to have delayed reponse to loss of balance and recommended family give supervision with mobility                    Cognition Arousal/Alertness: Awake/alert Behavior During Therapy: WFL for tasks assessed/performed Overall Cognitive Status: Within Functional Limits for tasks assessed                      Exercises Total Joint Exercises Ankle Circles/Pumps: AROM;Both;15 reps;Seated Quad Sets: AROM;Right;10 reps;Seated (pt had a hard time comprehending this exercise) Hip ABduction/ADduction: AAROM;Right;10 reps;Seated Straight Leg Raises: AAROM;Right;10 reps;Seated Long Arc Quad: AROM;Right;10 reps;Seated Goniometric ROM: 5-60    General Comments General comments (skin integrity, edema, etc.): discussed d/c recommendations      Pertinent Vitals/Pain Pain Assessment: 0-10 Pain Score: 7  Pain Location: right knee Pain Descriptors / Indicators: Aching Pain Intervention(s): Limited activity within patient's tolerance;Monitored during session    Home Living                      Prior Function            PT Goals (current goals can now be found in the care plan section) Acute Rehab PT Goals Patient Stated Goal: return home and watch tv PT Goal Formulation: With patient/family Time For Goal  Achievement: 11/20/15 Potential to Achieve Goals: Good Progress towards PT goals: Progressing toward goals    Frequency  7X/week    PT Plan Current plan remains appropriate    Co-evaluation             End of Session Equipment Utilized During Treatment: Gait belt Activity Tolerance: Patient tolerated treatment well Patient  left: in chair;with call bell/phone within reach;with family/visitor present     Time: [id number] PT Time Calculation (min) (ACUTE ONLY): 27 min  Charges:  $Gait Training: 23-37 mins                    G Codes:     Leighton Roach, PT  Acute Rehab Services  [phone]  Leighton Roach 11/14/2015, 11:36 AM

## 2015-11-14 NOTE — Progress Notes (Signed)
PATIENT ID: Casey Liu  MRN: [id number]  DOB/AGE:  1935/05/16 / 81 y.o.  2 Days Post-Op Procedure(s) (LRB): TOTAL KNEE ARTHROPLASTY (Right)    PROGRESS NOTE Subjective: Patient is alert, oriented, n Nausea, no Vomiting, yes passing gas. Taking PO well. Denies SOB, Chest or Calf Pain. Using Incentive Spirometer, PAS in place. Ambulate WBAT with pt walking 250 ft with therapy, CPM 0-40 Patient reports pain as moderate.    Objective: Vital signs in last 24 hours: Filed Vitals:   11/13/15 0500 11/13/15 1300 11/13/15 2300 11/14/15 0300  BP: 135/59 138/60 115/72 139/48  Pulse: 76 65 72 69  Temp: 98.6 F (37 C) 98.6 F (37 C) 98.1 F (36.7 C) 99.3 F (37.4 C)  TempSrc: Oral Oral Oral Oral  Resp: 16 16 16 16   Height:      Weight:      SpO2: 90% 93% 90%       Intake/Output from previous day: I/O last 3 completed shifts: In: 720 [P.O.:720] Out: -    Intake/Output this shift:     LABORATORY DATA:  Recent Labs  11/13/15 0302  11/13/15 1600 11/13/15 2216 11/14/15 0408 11/14/15 0630  WBC 7.4  --   --   --  10.7*  --   HGB 11.3*  --   --   --  10.6*  --   HCT 35.9*  --   --   --  33.2*  --   PLT 221  --   --   --  174  --   NA 133*  --   --   --   --   --   K 4.3  --   --   --   --   --   CL 104  --   --   --   --   --   CO2 22  --   --   --   --   --   BUN 11  --   --   --   --   --   CREATININE 0.89  --   --   --   --   --   GLUCOSE 136*  --   --   --   --   --   GLUCAP  --   < > 162* 118*  --  115*  CALCIUM 8.5*  --   --   --   --   --   < > = values in this interval not displayed.  Examination: Neurologically intact Neurovascular intact Sensation intact distally Intact pulses distally Dorsiflexion/Plantar flexion intact Incision: dressing C/D/I No cellulitis present Compartment soft}  Assessment:   2 Days Post-Op Procedure(s) (LRB): TOTAL KNEE ARTHROPLASTY (Right) ADDITIONAL DIAGNOSIS: Expected Acute Blood Loss Anemia, Diabetes and  Hypertension  Plan: PT/OT WBAT, CPM 5/hrs day until ROM 0-90 degrees, then D/C CPM DVT Prophylaxis:  SCDx72hrs, ASA 325 mg BID x 2 weeks DISCHARGE PLAN: Home DISCHARGE NEEDS: HHPT, CPM, Walker and 3-in-1 comode seat     PHILLIPS, ERIC R 11/14/2015, 7:48 AM

## 2016-04-01 ENCOUNTER — Other Ambulatory Visit: Payer: Self-pay | Admitting: Orthopedic Surgery

## 2016-04-14 NOTE — Pre-Procedure Instructions (Signed)
Casey Liu            04/15/2016    Casey Liu  04/14/2016      [id number], [place] [address] [place]  [address] Phone: [phone] Fax: [phone]    Your procedure is scheduled on Wednesday October 4.  Report to [facility] Admitting at 10:30 A.M.  Call this number if you have problems the morning of surgery:  [phone]   Remember:  Do not eat food or drink liquids after midnight.  Take these medicines the morning of surgery with A SIP OF WATER: atenolol (Tenormin), esomeprazole (Nexium), Nifedipine (Adalat), oxycodone (percocet) if needed, tizanidine (Zanaflex), flonase if needed  7 days prior to surgery STOP taking any Aspirin, Aleve, Naproxen, Ibuprofen, Motrin, Advil, Goody's, BC's, all herbal medications, fish oil, and all vitamins  WHAT DO I DO ABOUT MY DIABETES MEDICATION?   Casey Kitchen Do not take oral diabetes medicines (pills) the morning of surgery. DO NOT TAKE Metformin (Glucophage) the day of surgery      How to Manage Your Diabetes Before and After Surgery  Why is it important to control my blood sugar before and after surgery? . Improving blood sugar levels before and after surgery helps healing and can limit problems. . A way of improving blood sugar control is eating a healthy diet by: o  Eating less sugar and carbohydrates o  Increasing activity/exercise o  Talking with your doctor about reaching your blood sugar goals . High blood sugars (greater than 180 mg/dL) can raise your risk of infections and slow your recovery, so you will need to focus on controlling your diabetes during the weeks before surgery. . Make sure that the doctor who takes care of your diabetes knows about your planned surgery including the date and location.  How do I manage my blood sugar before surgery? . Check your blood sugar at least 4 times a day, starting 2 days before surgery, to  make sure that the level is not too high or low. o Check your blood sugar the morning of your surgery when you wake up and every 2 hours until you get to the Short Stay unit. . If your blood sugar is less than 70 mg/dL, you will need to treat for low blood sugar: o Do not take insulin. o Treat a low blood sugar (less than 70 mg/dL) with  cup of clear juice (cranberry or apple), 4 glucose tablets, OR glucose gel. o Recheck blood sugar in 15 minutes after treatment (to make sure it is greater than 70 mg/dL). If your blood sugar is not greater than 70 mg/dL on recheck, call [phone] for further instructions. . Report your blood sugar to the short stay nurse when you get to Short Stay.  . If you are admitted to the hospital after surgery: o Your blood sugar will be checked by the staff and you will probably be given insulin after surgery (instead of oral diabetes medicines) to make sure you have good blood sugar levels. o The goal for blood sugar control after surgery is 80-180 mg/dL.             Do not wear jewelry, make-up or nail polish.  Do not wear lotions, powders, or perfumes, or deoderant.  Do not shave 48 hours prior to surgery.    Do not bring valuables to the hospital.  [facility] is  not responsible for any belongings or valuables.  Contacts, dentures or bridgework may not be worn into surgery.  Leave your suitcase in the car.  After surgery it may be brought to your room.  For patients admitted to the hospital, discharge time will be determined by your treatment team.  Patients discharged the day of surgery will not be allowed to drive home.    Special instructions:    [place]- Preparing For Surgery  Before surgery, you can play an important role. Because skin is not sterile, your skin needs to be as free of germs as possible. You can reduce the number of germs on your skin by washing with CHG (chlorahexidine gluconate) Soap before surgery.  CHG is an  antiseptic cleaner which kills germs and bonds with the skin to continue killing germs even after washing.  Please do not use if you have an allergy to CHG or antibacterial soaps. If your skin becomes reddened/irritated stop using the CHG.  Do not shave (including legs and underarms) for at least 48 hours prior to first CHG shower. It is OK to shave your face.  Please follow these instructions carefully.   1. Shower the NIGHT BEFORE SURGERY and the MORNING OF SURGERY with CHG.   2. If you chose to wash your hair, wash your hair first as usual with your normal shampoo.  3. After you shampoo, rinse your hair and body thoroughly to remove the shampoo.  4. Use CHG as you would any other liquid soap. You can apply CHG directly to the skin and wash gently with a scrungie or a clean washcloth.   5. Apply the CHG Soap to your body ONLY FROM THE NECK DOWN.  Do not use on open wounds or open sores. Avoid contact with your eyes, ears, mouth and genitals (private parts). Wash genitals (private parts) with your normal soap.  6. Wash thoroughly, paying special attention to the area where your surgery will be performed.  7. Thoroughly rinse your body with warm water from the neck down.  8. DO NOT shower/wash with your normal soap after using and rinsing off the CHG Soap.  9. Pat yourself dry with a CLEAN TOWEL.   10. Wear CLEAN PAJAMAS   11. Place CLEAN SHEETS on your bed the night of your first shower and DO NOT SLEEP WITH PETS.    Day of Surgery: Do not apply any deodorants/lotions. Please wear clean clothes to the hospital/surgery center.      Please read over the following fact sheets that you were given. MRSA Information    Instrucciones Para Antes de la Ciruga   Su ciruga est programada para-(your procedure is scheduled on) Wednesday October 4   [place] - (enter)- at 10:30 AM  Por favor llame al [phone] si tiene algn problema en la  maana de la ciruga. (please call if you have any problems the morning of surgery.)   Recuerde: (Remember)  No coma alimentos ni tome lquidos, incluyendo agua, despus de la medianoche. (Do not eat food or drink liquids including water after midnight on_______________  Jomarie Longs estas medicinas en la maana de la ciruga con un SORBITO de agua (take these meds the morning of surgery with a SIP of water) atenolol (Tenormin), esomeprazole (Nexium), Nifedipine (Adalat), oxycodone (percocet) if needed, tizanidine (Zanaflex), flonase if needed  Puede cepillarse los dientes en la maana de la [place]. (you may brush your teeth the morning of surgery)  No use joyas,  maquillaje de ojos, lpiz labial, crema para el cuerpo o esmalte de uas oscuro. (Do not wear jewelry, eye makeup, lipstick, body lotion, or dark fingernail polish)  No puede usar desodorante. (you may wear deodorant)  Si va a ser ingresado despues de la ciruga, deje la maleta en el carro hasta que se le haya asignado una habitacin. (If you are to be admitted after surgery, leave suitcase in car until your room has been assigned.)  A los pacientes que se les d de alta el mismo da no se les permitir manejar a casa.  (Patients discharged on the day of surgery will not be allowed to drive home)  Use ropa suelta y cmoda de regreso a casa. (wear loose comfortable clothes for ride home)   [place] (patient signature) ______________________________________

## 2016-04-15 ENCOUNTER — Encounter (HOSPITAL_COMMUNITY): Payer: Self-pay

## 2016-04-15 ENCOUNTER — Ambulatory Visit (HOSPITAL_COMMUNITY)
Admission: RE | Admit: 2016-04-15 | Discharge: 2016-04-15 | Disposition: A | Discharge status: Home / Self Care | Payer: Medicare Other | Source: Ambulatory Visit | Attending: Orthopedic Surgery | Admitting: Orthopedic Surgery

## 2016-04-15 ENCOUNTER — Encounter (HOSPITAL_COMMUNITY)
Admission: RE | Admit: 2016-04-15 | Discharge: 2016-04-15 | Disposition: A | Discharge status: Home / Self Care | Payer: Medicare Other | Source: Ambulatory Visit | Attending: Orthopedic Surgery | Admitting: Orthopedic Surgery

## 2016-04-15 DIAGNOSIS — Z01818 Encounter for other preprocedural examination: Secondary | ICD-10-CM | POA: Insufficient documentation

## 2016-04-15 DIAGNOSIS — I517 Cardiomegaly: Secondary | ICD-10-CM | POA: Insufficient documentation

## 2016-04-15 DIAGNOSIS — Z01812 Encounter for preprocedural laboratory examination: Secondary | ICD-10-CM | POA: Diagnosis present

## 2016-04-15 HISTORY — DX: Cerebral infarction, unspecified: I63.9

## 2016-04-15 LAB — CBC WITH DIFFERENTIAL/PLATELET
Basophils Absolute: 0.1 10*3/uL (ref 0.0–0.1)
Basophils Relative: 1 %
Eosinophils Absolute: 0 10*3/uL (ref 0.0–0.7)
Eosinophils Relative: 0 %
HCT: 44 % (ref 36.0–46.0)
Hemoglobin: 13.6 g/dL (ref 12.0–15.0)
Lymphocytes Relative: 31 %
Lymphs Abs: 2.8 10*3/uL (ref 0.7–4.0)
MCH: 27.8 pg (ref 26.0–34.0)
MCHC: 30.9 g/dL (ref 30.0–36.0)
MCV: 90 fL (ref 78.0–100.0)
Monocytes Absolute: 0.5 10*3/uL (ref 0.1–1.0)
Monocytes Relative: 6 %
Neutro Abs: 5.7 10*3/uL (ref 1.7–7.7)
Neutrophils Relative %: 62 %
Platelets: 278 10*3/uL (ref 150–400)
RBC: 4.89 MIL/uL (ref 3.87–5.11)
RDW: 15.8 % — ABNORMAL HIGH (ref 11.5–15.5)
WBC: 9.1 10*3/uL (ref 4.0–10.5)

## 2016-04-15 LAB — URINALYSIS, ROUTINE W REFLEX MICROSCOPIC
Glucose, UA: NEGATIVE mg/dL
Hgb urine dipstick: NEGATIVE
Ketones, ur: 15 mg/dL — AB
Nitrite: NEGATIVE
Protein, ur: NEGATIVE mg/dL
Specific Gravity, Urine: 1.035 — ABNORMAL HIGH (ref 1.005–1.030)
pH: 5.5 (ref 5.0–8.0)

## 2016-04-15 LAB — SURGICAL PCR SCREEN
MRSA, PCR: NEGATIVE
Staphylococcus aureus: NEGATIVE

## 2016-04-15 LAB — PROTIME-INR
INR: 1.05
Prothrombin Time: 13.7 seconds (ref 11.4–15.2)

## 2016-04-15 LAB — GLUCOSE, CAPILLARY: Glucose-Capillary: 106 mg/dL — ABNORMAL HIGH (ref 65–99)

## 2016-04-15 LAB — BASIC METABOLIC PANEL
Anion gap: 11 (ref 5–15)
BUN: 22 mg/dL — ABNORMAL HIGH (ref 6–20)
CO2: 20 mmol/L — ABNORMAL LOW (ref 22–32)
Calcium: 10.2 mg/dL (ref 8.9–10.3)
Chloride: 107 mmol/L (ref 101–111)
Creatinine, Ser: 0.78 mg/dL (ref 0.44–1.00)
GFR calc Af Amer: >60 mL/min (ref >60)
GFR calc non Af Amer: >60 mL/min (ref >60)
Glucose, Bld: 103 mg/dL — ABNORMAL HIGH (ref 65–99)
Potassium: 4.6 mmol/L (ref 3.5–5.1)
Sodium: 138 mmol/L (ref 135–145)

## 2016-04-15 LAB — APTT: aPTT: 29 seconds (ref 24–36)

## 2016-04-15 LAB — NO BLOOD PRODUCTS

## 2016-04-15 LAB — URINE MICROSCOPIC-ADD ON: RBC / HPF: NONE SEEN RBC/hpf (ref 0–5)

## 2016-04-15 NOTE — Progress Notes (Signed)
PCP: Dr. Jonelle Sidle Pt with Hx echo in 2013 and stress test in 2012.  See ALlison Z's note 11/05/15  EKG: 11/05/15 CXR: 04/15/16  Pt with no complaints of chest pain, SOB, signs of infection. Daughter Interpreted for patient, patient refused interpretor. Pt also refusing blood products, Dr. Damita Dunnings office called to notify.

## 2016-04-15 NOTE — Progress Notes (Addendum)
Anesthesia Chart Review: Patient is a 81 year old female scheduled for left TKA on 04/23/16 by Dr. Mayer Camel. She is s/p right TKA on 11/12/15 (adductor canal block, spinal). Patient is Spanish speaking.  History includes non-smoker, DM2, HTN, OSA with CPAP, mild to moderate pulmonary hypertension by 07/2011 echo, CVA (left sided weakness), cholecystectomy, appendectomy, hysterectomy, multiple eye surgeries.   PCP is listed as Dr. Jonelle Sidle [facility], phone [phone], fax [phone]). He cleared her prior to 10/2015 right TKA.  She has been evaluated by cardiologist Dr. Einar Gip [facility]) in the past, but was discharged with PRN cardiology follow-up in 12/2013 with recommendation for sleep medicine referral and continued primary care follow-up. He was seeing her for dyspnea which improved significantly with more aggressive treatment of her HTN.   Meds include ASA 325 mg daily PRN pain, atenolol, Combigan and Lotemax, Nexium, Flonase, lisinopril, metformin, nifedipine, Roxicet, Zanaflex.   BP 127/68   Pulse 72   Temp 36.7 C   Resp 20   Ht 5\' 5"  (1.651 m)   Wt 170 lb 14.4 oz (77.5 kg)   SpO2 97%   BMI 28.44 kg/m    11/05/15 EKG: NSR.  [facility] records are scanned under the Media tab, Correspondence, encounter 11/12/15. These include: - 08/14/11 Echo: Normal LVEF, EF 55-60%, mild to moderate LVH, grade I/IV diastolic dysfunction, RVH, mild TR, mild to moderate pulmonary hypertension.Her echo was felt stable since at least 02/05/2011.  - 02/07/11 Lexiscan Sestamibi: Normal perfusion without ischemia or scar, normal LVEF.  04/15/16 CXR: IMPRESSION: IMPRESSION: Stable mild cardiomegaly.  No active lung disease  Preoperative labs noted. Cr 0.78. Glucose 103. H/H 13.6/44.0. PT/PTT WNL. UA was cloudy, small leukocytes, negative nitrites, 6-30 WBC (voice mail left with Juliann Pulse at Dr. Damita Dunnings office regarding UA results). A1c on 11/12/15 was 6.4.  She refused blood  products (Dr. Damita Dunnings staff notified by PAT RN.)  Patient tolerated similar procedure in April. If no acute changes then I would anticipate that she could proceed as planned.  George Hugh [facility] Short Stay Center/Anesthesiology Phone [phone] 04/15/2016 5:56 PM

## 2016-04-16 LAB — HEMOGLOBIN A1C
Hgb A1c MFr Bld: 6.2 % — ABNORMAL HIGH (ref 4.8–5.6)
Mean Plasma Glucose: 131 mg/dL

## 2016-04-22 NOTE — H&P (Signed)
TOTAL KNEE ADMISSION H&P  Patient is being admitted for left total knee arthroplasty.  Subjective:  Chief Complaint:left knee pain.  HPI: Casey Liu, 81 y.o. female, has a history of pain and functional disability in the left knee due to arthritis and has failed non-surgical conservative treatments for greater than 12 weeks to includeNSAID's and/or analgesics, corticosteriod injections, flexibility and strengthening excercises, use of assistive devices, weight reduction as appropriate and activity modification.  Onset of symptoms was gradual, starting 1 years ago with gradually worsening course since that time. The patient noted no past surgery on the left knee(s).  Patient currently rates pain in the left knee(s) at 10 out of 10 with activity. Patient has night pain, worsening of pain with activity and weight bearing, pain that interferes with activities of daily living, pain with passive range of motion and crepitus.  Patient has evidence of subchondral sclerosis, periarticular osteophytes and joint space narrowing by imaging studies.   There is no active infection.  Patient Active Problem List   Diagnosis Date Noted  . Primary osteoarthritis of right knee 11/11/2015  . 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 Vaginitis 08/16/2012   Past Medical History:  Diagnosis Date  . Arthritis   . Cataract    right  . Diabetes mellitus without complication ([place])   . Family history of anesthesia complication    mother had "three heart attacks after anesthesia"  . Headache(784.0)    hx of  . History of stomach ulcers   . Hypertension    Dr. Jonelle Sidle [phone]  . Pneumonia    hx of   . Seasonal allergies   . Sleep apnea    cpap  . Stroke [facility])    left side weakness  . Urinary tract infection    hx of    Past Surgical History:  Procedure Laterality Date  . ABDOMINAL HYSTERECTOMY    . APPENDECTOMY    . BLADDER SURGERY    . CATARACT EXTRACTION W/PHACO  07/07/2012   Procedure: CATARACT EXTRACTION PHACO AND INTRAOCULAR LENS PLACEMENT (IOC);  Surgeon: Adonis Brook, MD;  Location: [facility];  Service: Ophthalmology;  Laterality: Right;  . CESAREAN SECTION     x 1  . CHOLECYSTECTOMY    . EYE SURGERY     "seven eye surgery"  . KNEE SURGERY     left knee  . OVARIAN CYST REMOVAL    . TOTAL KNEE ARTHROPLASTY Right 11/12/2015   Procedure: TOTAL KNEE ARTHROPLASTY;  Surgeon: Frederik Pear, MD;  Location: [place];  Service: Orthopedics;  Laterality: Right;  . TUBAL LIGATION      No prescriptions prior to admission.   Allergies  Allergen Reactions  . Celebrex [Celecoxib] Nausea And Vomiting and Palpitations  . Latex Rash    Social History  Substance Use Topics  . Smoking status: Never Smoker  . Smokeless tobacco: Never Used  . Alcohol use No     Comment: social hx     No family history on file.   Review of Systems  Constitutional: Negative.   HENT: Positive for hearing loss.   Eyes: Positive for blurred vision.  Respiratory: Negative.   Cardiovascular: Positive for leg swelling.  Gastrointestinal: Negative.   Genitourinary: Negative.   Musculoskeletal: Positive for joint pain.  Skin: Negative.   Neurological: Negative.   Endo/Heme/Allergies: Negative.   Psychiatric/Behavioral: Negative.     Objective:  Physical Exam  Constitutional: She is oriented to person, place, and time. She appears well-developed and well-nourished.  HENT:  Head: Normocephalic and atraumatic.  Eyes: Pupils are equal, round, and reactive to light.  Neck: Normal range of motion. Neck supple.  Cardiovascular: Intact distal pulses.   Respiratory: Effort normal.  Musculoskeletal: She exhibits tenderness.  the patient's right knee continues to have a range from roughly 5 to 110.  No instability.  Her calves are soft and nontender.  Patient's left knee does have moderate pain with palpation over the medial joint line.  No instability.  Mild swelling.  Obvious crepitus with  range of motion.  Her calves are soft and nontender.  Neurological: She is alert and oriented to person, place, and time.  Skin: Skin is warm and dry.  Psychiatric: She has a normal mood and affect. Her behavior is normal. Judgment and thought content normal.    Vital signs in last 24 hours:    Labs:   Estimated body mass index is 28.44 kg/m as calculated from the following:   Height as of 04/15/16: 5\' 5"  (1.651 m).   Weight as of 04/15/16: 77.5 kg (170 lb 14.4 oz).   Imaging Review Plain radiographs demonstrate bone-on-bone arthritic changes lateral subluxation of tibias beneath the left femur   Assessment/Plan:  End stage arthritis, left knee   The patient history, physical examination, clinical judgment of the provider and imaging studies are consistent with end stage degenerative joint disease of the left knee(s) and total knee arthroplasty is deemed medically necessary. The treatment options including medical management, injection therapy arthroscopy and arthroplasty were discussed at length. The risks and benefits of total knee arthroplasty were presented and reviewed. The risks due to aseptic loosening, infection, stiffness, patella tracking problems, thromboembolic complications and other imponderables were discussed. The patient acknowledged the explanation, agreed to proceed with the plan and consent was signed. Patient is being admitted for inpatient treatment for surgery, pain control, PT, OT, prophylactic antibiotics, VTE prophylaxis, progressive ambulation and ADL's and discharge planning. The patient is planning to be discharged home with home health services

## 2016-04-23 ENCOUNTER — Inpatient Hospital Stay (HOSPITAL_COMMUNITY): Payer: Medicare Other | Admitting: Anesthesiology

## 2016-04-23 ENCOUNTER — Encounter (HOSPITAL_COMMUNITY)
Admission: RE | Disposition: A | Discharge status: Home Health | Payer: Self-pay | Source: Ambulatory Visit | Attending: Orthopedic Surgery

## 2016-04-23 ENCOUNTER — Encounter (HOSPITAL_COMMUNITY): Payer: Self-pay | Admitting: *Deleted

## 2016-04-23 ENCOUNTER — Inpatient Hospital Stay (HOSPITAL_COMMUNITY): Payer: Medicare Other | Admitting: Vascular Surgery

## 2016-04-23 ENCOUNTER — Inpatient Hospital Stay (HOSPITAL_COMMUNITY)
Admission: RE | Admit: 2016-04-23 | Discharge: 2016-04-25 | DRG: 470 | Disposition: A | Discharge status: Home Health | Payer: Medicare Other | Source: Ambulatory Visit | Attending: Orthopedic Surgery | Admitting: Orthopedic Surgery

## 2016-04-23 DIAGNOSIS — Z8711 Personal history of peptic ulcer disease: Secondary | ICD-10-CM

## 2016-04-23 DIAGNOSIS — I119 Hypertensive heart disease without heart failure: Secondary | ICD-10-CM | POA: Diagnosis present

## 2016-04-23 DIAGNOSIS — G4730 Sleep apnea, unspecified: Secondary | ICD-10-CM | POA: Diagnosis present

## 2016-04-23 DIAGNOSIS — M1711 Unilateral primary osteoarthritis, right knee: Principal | ICD-10-CM | POA: Diagnosis present

## 2016-04-23 DIAGNOSIS — Z888 Allergy status to other drugs, medicaments and biological substances status: Secondary | ICD-10-CM | POA: Diagnosis not present

## 2016-04-23 DIAGNOSIS — E119 Type 2 diabetes mellitus without complications: Secondary | ICD-10-CM | POA: Diagnosis present

## 2016-04-23 DIAGNOSIS — Z96651 Presence of right artificial knee joint: Secondary | ICD-10-CM | POA: Diagnosis present

## 2016-04-23 DIAGNOSIS — M1712 Unilateral primary osteoarthritis, left knee: Secondary | ICD-10-CM | POA: Diagnosis present

## 2016-04-23 DIAGNOSIS — Z91040 Latex allergy status: Secondary | ICD-10-CM | POA: Diagnosis not present

## 2016-04-23 HISTORY — PX: TOTAL KNEE ARTHROPLASTY: SHX125

## 2016-04-23 LAB — GLUCOSE, CAPILLARY
Glucose-Capillary: 103 mg/dL — ABNORMAL HIGH (ref 65–99)
Glucose-Capillary: 99 mg/dL (ref 65–99)
Glucose-Capillary: 145 mg/dL — ABNORMAL HIGH (ref 65–99)

## 2016-04-23 LAB — TYPE AND SCREEN
ABO/RH(D): A POS
Antibody Screen: NEGATIVE

## 2016-04-23 SURGERY — ARTHROPLASTY, KNEE, TOTAL
Anesthesia: Spinal | Site: Knee | Laterality: Left

## 2016-04-23 MED ORDER — SODIUM CHLORIDE 0.9 % IJ SOLN
INTRAMUSCULAR | Status: DC | PRN
  Administered 2016-04-23: 50 mL via INTRAVENOUS

## 2016-04-23 MED ORDER — HYDROMORPHONE HCL 1 MG/ML IJ SOLN
INTRAMUSCULAR | Status: AC
  Administered 2016-04-23: 0.5 mg via INTRAVENOUS
  Filled 2016-04-23: qty 1

## 2016-04-23 MED ORDER — BUPIVACAINE LIPOSOME 1.3 % IJ SUSP
INTRAMUSCULAR | Status: DC | PRN
  Administered 2016-04-23: 20 mL

## 2016-04-23 MED ORDER — BUPIVACAINE LIPOSOME 1.3 % IJ SUSP
20.0000 mL | Freq: Once | INTRAMUSCULAR | Status: DC
  Filled 2016-04-23: qty 20

## 2016-04-23 MED ORDER — KCL IN DEXTROSE-NACL 20-5-0.45 MEQ/L-%-% IV SOLN
INTRAVENOUS | Status: DC
  Administered 2016-04-23: 18:00:00 via INTRAVENOUS
  Filled 2016-04-23: qty 1000

## 2016-04-23 MED ORDER — FLUTICASONE PROPIONATE 50 MCG/ACT NA SUSP
1.0000 | Freq: Every day | NASAL | Status: DC
  Administered 2016-04-24 – 2016-04-25 (×2): 1 via NASAL
  Filled 2016-04-23: qty 16

## 2016-04-23 MED ORDER — METHOCARBAMOL 500 MG PO TABS
500.0000 mg | ORAL_TABLET | Freq: Four times a day (QID) | ORAL | Status: DC | PRN
  Administered 2016-04-23 – 2016-04-24 (×2): 500 mg via ORAL
  Filled 2016-04-23 (×2): qty 1

## 2016-04-23 MED ORDER — DIPHENHYDRAMINE HCL 12.5 MG/5ML PO ELIX: 12.5000 mg | ORAL_SOLUTION | ORAL | Status: DC | PRN

## 2016-04-23 MED ORDER — 0.9 % SODIUM CHLORIDE (POUR BTL) OPTIME
TOPICAL | Status: DC | PRN
  Administered 2016-04-23: 1000 mL

## 2016-04-23 MED ORDER — ASPIRIN EC 325 MG PO TBEC: 325.0000 mg | DELAYED_RELEASE_TABLET | Freq: Two times a day (BID) | ORAL | 0 refills | Status: DC

## 2016-04-23 MED ORDER — BRIMONIDINE TARTRATE-TIMOLOL 0.2-0.5 % OP SOLN: 1.0000 [drp] | Freq: Two times a day (BID) | OPHTHALMIC | Status: DC

## 2016-04-23 MED ORDER — HYDROMORPHONE HCL 1 MG/ML IJ SOLN: 1.0000 mg | INTRAMUSCULAR | Status: DC | PRN

## 2016-04-23 MED ORDER — OXYCODONE HCL 5 MG PO TABS
ORAL_TABLET | ORAL | Status: AC
  Administered 2016-04-23: 10 mg via ORAL
  Filled 2016-04-23: qty 2

## 2016-04-23 MED ORDER — HYDROMORPHONE HCL 1 MG/ML IJ SOLN
0.2500 mg | INTRAMUSCULAR | Status: DC | PRN
  Administered 2016-04-23 (×2): 0.5 mg via INTRAVENOUS

## 2016-04-23 MED ORDER — ACETAMINOPHEN 650 MG RE SUPP: 650.0000 mg | Freq: Four times a day (QID) | RECTAL | Status: DC | PRN

## 2016-04-23 MED ORDER — LOTEPREDNOL ETABONATE 0.5 % OP SUSP
1.0000 [drp] | Freq: Every day | OPHTHALMIC | Status: DC
  Administered 2016-04-24 – 2016-04-25 (×2): 1 [drp] via OPHTHALMIC
  Filled 2016-04-23: qty 5

## 2016-04-23 MED ORDER — CHLORHEXIDINE GLUCONATE 4 % EX LIQD: 60.0000 mL | Freq: Once | CUTANEOUS | Status: DC

## 2016-04-23 MED ORDER — METOCLOPRAMIDE HCL 5 MG/ML IJ SOLN: 5.0000 mg | Freq: Three times a day (TID) | INTRAMUSCULAR | Status: DC | PRN

## 2016-04-23 MED ORDER — ACETAMINOPHEN 325 MG PO TABS: 650.0000 mg | ORAL_TABLET | Freq: Four times a day (QID) | ORAL | Status: DC | PRN

## 2016-04-23 MED ORDER — FENTANYL CITRATE (PF) 100 MCG/2ML IJ SOLN
INTRAMUSCULAR | Status: AC
  Filled 2016-04-23: qty 2

## 2016-04-23 MED ORDER — DOCUSATE SODIUM 100 MG PO CAPS
100.0000 mg | ORAL_CAPSULE | Freq: Two times a day (BID) | ORAL | Status: DC
  Administered 2016-04-23 – 2016-04-25 (×4): 100 mg via ORAL
  Filled 2016-04-23 (×4): qty 1

## 2016-04-23 MED ORDER — BUPIVACAINE-EPINEPHRINE (PF) 0.25% -1:200000 IJ SOLN
INTRAMUSCULAR | Status: DC | PRN
  Administered 2016-04-23: 50 mL

## 2016-04-23 MED ORDER — SENNOSIDES-DOCUSATE SODIUM 8.6-50 MG PO TABS: 1.0000 | ORAL_TABLET | Freq: Every evening | ORAL | Status: DC | PRN

## 2016-04-23 MED ORDER — DEXTROSE-NACL 5-0.45 % IV SOLN: INTRAVENOUS | Status: DC

## 2016-04-23 MED ORDER — ONDANSETRON HCL 4 MG/2ML IJ SOLN: 4.0000 mg | Freq: Four times a day (QID) | INTRAMUSCULAR | Status: DC | PRN

## 2016-04-23 MED ORDER — OXYCODONE HCL 5 MG PO TABS
5.0000 mg | ORAL_TABLET | ORAL | Status: DC | PRN
  Administered 2016-04-23 (×2): 10 mg via ORAL
  Administered 2016-04-24: 5 mg via ORAL
  Administered 2016-04-24 – 2016-04-25 (×3): 10 mg via ORAL
  Filled 2016-04-23 (×5): qty 2

## 2016-04-23 MED ORDER — TIMOLOL MALEATE 0.5 % OP SOLN
1.0000 [drp] | Freq: Two times a day (BID) | OPHTHALMIC | Status: DC
  Administered 2016-04-23 – 2016-04-25 (×4): 1 [drp] via OPHTHALMIC
  Filled 2016-04-23: qty 5

## 2016-04-23 MED ORDER — FENTANYL CITRATE (PF) 100 MCG/2ML IJ SOLN
INTRAMUSCULAR | Status: DC | PRN
  Administered 2016-04-23: 50 ug via INTRAVENOUS

## 2016-04-23 MED ORDER — NIFEDIPINE ER OSMOTIC RELEASE 90 MG PO TB24
90.0000 mg | ORAL_TABLET | Freq: Every day | ORAL | Status: DC
  Administered 2016-04-24: 90 mg via ORAL
  Filled 2016-04-23 (×2): qty 1

## 2016-04-23 MED ORDER — ALUM & MAG HYDROXIDE-SIMETH 200-200-20 MG/5ML PO SUSP: 30.0000 mL | ORAL | Status: DC | PRN

## 2016-04-23 MED ORDER — PHENOL 1.4 % MT LIQD: 1.0000 | OROMUCOSAL | Status: DC | PRN

## 2016-04-23 MED ORDER — METHOCARBAMOL 1000 MG/10ML IJ SOLN
500.0000 mg | Freq: Four times a day (QID) | INTRAVENOUS | Status: DC | PRN
  Filled 2016-04-23: qty 5

## 2016-04-23 MED ORDER — ROPIVACAINE HCL 7.5 MG/ML IJ SOLN
INTRAMUSCULAR | Status: DC | PRN
  Administered 2016-04-23: 15 mL via PERINEURAL

## 2016-04-23 MED ORDER — BISACODYL 5 MG PO TBEC: 5.0000 mg | DELAYED_RELEASE_TABLET | Freq: Every day | ORAL | Status: DC | PRN

## 2016-04-23 MED ORDER — DIPHENHYDRAMINE HCL 50 MG/ML IJ SOLN
INTRAMUSCULAR | Status: DC | PRN
  Administered 2016-04-23: 25 mg via INTRAVENOUS

## 2016-04-23 MED ORDER — TRANEXAMIC ACID 1000 MG/10ML IV SOLN
2000.0000 mg | Freq: Once | INTRAVENOUS | Status: DC
  Filled 2016-04-23: qty 20

## 2016-04-23 MED ORDER — FLEET ENEMA 7-19 GM/118ML RE ENEM: 1.0000 | ENEMA | Freq: Once | RECTAL | Status: DC | PRN

## 2016-04-23 MED ORDER — METOCLOPRAMIDE HCL 5 MG PO TABS: 5.0000 mg | ORAL_TABLET | Freq: Three times a day (TID) | ORAL | Status: DC | PRN

## 2016-04-23 MED ORDER — METHOCARBAMOL 500 MG PO TABS
ORAL_TABLET | ORAL | Status: AC
  Filled 2016-04-23: qty 1

## 2016-04-23 MED ORDER — TIZANIDINE HCL 2 MG PO CAPS: 2.0000 mg | ORAL_CAPSULE | Freq: Three times a day (TID) | ORAL | 0 refills | Status: DC

## 2016-04-23 MED ORDER — BUPIVACAINE-EPINEPHRINE (PF) 0.25% -1:200000 IJ SOLN
INTRAMUSCULAR | Status: AC
  Filled 2016-04-23: qty 60

## 2016-04-23 MED ORDER — TRANEXAMIC ACID 1000 MG/10ML IV SOLN
INTRAVENOUS | Status: DC | PRN
  Administered 2016-04-23: 2000 mg via TOPICAL

## 2016-04-23 MED ORDER — OXYCODONE-ACETAMINOPHEN 5-325 MG PO TABS: 1.0000 | ORAL_TABLET | ORAL | 0 refills | Status: DC | PRN

## 2016-04-23 MED ORDER — CEFAZOLIN SODIUM-DEXTROSE 2-4 GM/100ML-% IV SOLN
2.0000 g | INTRAVENOUS | Status: AC
  Administered 2016-04-23: 2 g via INTRAVENOUS
  Filled 2016-04-23: qty 100

## 2016-04-23 MED ORDER — PROPOFOL 500 MG/50ML IV EMUL
INTRAVENOUS | Status: DC | PRN
  Administered 2016-04-23: 50 ug/kg/min via INTRAVENOUS

## 2016-04-23 MED ORDER — PANTOPRAZOLE SODIUM 40 MG PO TBEC
80.0000 mg | DELAYED_RELEASE_TABLET | Freq: Every day | ORAL | Status: DC
  Administered 2016-04-24 – 2016-04-25 (×2): 80 mg via ORAL
  Filled 2016-04-23 (×2): qty 2

## 2016-04-23 MED ORDER — PHENYLEPHRINE HCL 10 MG/ML IJ SOLN
INTRAMUSCULAR | Status: DC | PRN
  Administered 2016-04-23 (×3): 80 ug via INTRAVENOUS

## 2016-04-23 MED ORDER — BRIMONIDINE TARTRATE 0.2 % OP SOLN
1.0000 [drp] | Freq: Two times a day (BID) | OPHTHALMIC | Status: DC
  Administered 2016-04-23 – 2016-04-25 (×4): 1 [drp] via OPHTHALMIC
  Filled 2016-04-23: qty 5

## 2016-04-23 MED ORDER — ASPIRIN EC 325 MG PO TBEC
325.0000 mg | DELAYED_RELEASE_TABLET | Freq: Every day | ORAL | Status: DC
  Administered 2016-04-24 – 2016-04-25 (×2): 325 mg via ORAL
  Filled 2016-04-23 (×2): qty 1

## 2016-04-23 MED ORDER — SODIUM CHLORIDE 0.9 % IR SOLN
Status: DC | PRN
  Administered 2016-04-23: 1000 mL

## 2016-04-23 MED ORDER — ATENOLOL 50 MG PO TABS
100.0000 mg | ORAL_TABLET | Freq: Every day | ORAL | Status: DC
  Administered 2016-04-24 – 2016-04-25 (×2): 100 mg via ORAL
  Filled 2016-04-23 (×2): qty 2

## 2016-04-23 MED ORDER — MENTHOL 3 MG MT LOZG: 1.0000 | LOZENGE | OROMUCOSAL | Status: DC | PRN

## 2016-04-23 MED ORDER — PROMETHAZINE HCL 25 MG/ML IJ SOLN: 6.2500 mg | INTRAMUSCULAR | Status: DC | PRN

## 2016-04-23 MED ORDER — FENTANYL CITRATE (PF) 100 MCG/2ML IJ SOLN
INTRAMUSCULAR | Status: AC
  Administered 2016-04-23: 25 ug
  Filled 2016-04-23: qty 2

## 2016-04-23 MED ORDER — LACTATED RINGERS IV SOLN
INTRAVENOUS | Status: DC | PRN
  Administered 2016-04-23 (×2): via INTRAVENOUS

## 2016-04-23 MED ORDER — LISINOPRIL 20 MG PO TABS
20.0000 mg | ORAL_TABLET | Freq: Every day | ORAL | Status: DC
  Administered 2016-04-24 – 2016-04-25 (×2): 20 mg via ORAL
  Filled 2016-04-23 (×2): qty 1

## 2016-04-23 MED ORDER — ONDANSETRON HCL 4 MG PO TABS: 4.0000 mg | ORAL_TABLET | Freq: Four times a day (QID) | ORAL | Status: DC | PRN

## 2016-04-23 MED ORDER — BUPIVACAINE HCL (PF) 0.25 % IJ SOLN
INTRAMUSCULAR | Status: AC
  Filled 2016-04-23: qty 60

## 2016-04-23 MED ORDER — MIDAZOLAM HCL 2 MG/2ML IJ SOLN
INTRAMUSCULAR | Status: AC
  Filled 2016-04-23: qty 2

## 2016-04-23 MED ORDER — METFORMIN HCL 500 MG PO TABS
500.0000 mg | ORAL_TABLET | Freq: Two times a day (BID) | ORAL | Status: DC
  Administered 2016-04-23 – 2016-04-25 (×4): 500 mg via ORAL
  Filled 2016-04-23 (×4): qty 1

## 2016-04-23 MED ORDER — NIFEDIPINE ER OSMOTIC RELEASE 90 MG PO TB24
90.0000 mg | ORAL_TABLET | Freq: Every day | ORAL | Status: DC
Start: 2016-04-23 — End: 2016-04-23

## 2016-04-23 MED ORDER — PHENYLEPHRINE 40 MCG/ML (10ML) SYRINGE FOR IV PUSH (FOR BLOOD PRESSURE SUPPORT)
PREFILLED_SYRINGE | INTRAVENOUS | Status: AC
  Filled 2016-04-23: qty 20

## 2016-04-23 SURGICAL SUPPLY — 53 items
BANDAGE ACE 6X5 VEL STRL LF (GAUZE/BANDAGES/DRESSINGS) ×2 IMPLANT
BANDAGE ESMARK 6X9 LF (GAUZE/BANDAGES/DRESSINGS) ×1 IMPLANT
BLADE SAG 18X100X1.27 (BLADE) ×2 IMPLANT
BLADE SAW SGTL 13X75X1.27 (BLADE) ×2 IMPLANT
BLADE SURG ROTATE 9660 (MISCELLANEOUS) IMPLANT
BNDG ELASTIC 6X10 VLCR STRL LF (GAUZE/BANDAGES/DRESSINGS) ×2 IMPLANT
BNDG ESMARK 6X9 LF (GAUZE/BANDAGES/DRESSINGS) ×2
BOWL SMART MIX CTS (DISPOSABLE) ×2 IMPLANT
CAP KNEE TOTAL 3 SIGMA ×2 IMPLANT
CEMENT HV SMART SET (Cement) ×4 IMPLANT
COVER SURGICAL LIGHT HANDLE (MISCELLANEOUS) ×2 IMPLANT
CUFF TOURNIQUET SINGLE 34IN LL (TOURNIQUET CUFF) ×2 IMPLANT
CUFF TOURNIQUET SINGLE 44IN (TOURNIQUET CUFF) IMPLANT
DRAPE EXTREMITY T 121X128X90 (DRAPE) ×2 IMPLANT
DRAPE U-SHAPE 47X51 STRL (DRAPES) ×2 IMPLANT
DRSG AQUACEL AG ADV 3.5X10 (GAUZE/BANDAGES/DRESSINGS) ×2 IMPLANT
DURAPREP 26ML APPLICATOR (WOUND CARE) ×4 IMPLANT
ELECT REM PT RETURN 9FT ADLT (ELECTROSURGICAL) ×2
ELECTRODE REM PT RTRN 9FT ADLT (ELECTROSURGICAL) ×1 IMPLANT
EVACUATOR 1/8 PVC DRAIN (DRAIN) IMPLANT
GLOVE BIO SURGEON STRL SZ7.5 (GLOVE) IMPLANT
GLOVE BIO SURGEON STRL SZ8.5 (GLOVE) IMPLANT
GLOVE BIOGEL PI IND STRL 8 (GLOVE) IMPLANT
GLOVE BIOGEL PI IND STRL 9 (GLOVE) IMPLANT
GLOVE BIOGEL PI INDICATOR 8 (GLOVE)
GLOVE BIOGEL PI INDICATOR 9 (GLOVE)
GOWN STRL REUS W/ TWL LRG LVL3 (GOWN DISPOSABLE) ×1 IMPLANT
GOWN STRL REUS W/ TWL XL LVL3 (GOWN DISPOSABLE) ×2 IMPLANT
GOWN STRL REUS W/TWL LRG LVL3 (GOWN DISPOSABLE) ×1
GOWN STRL REUS W/TWL XL LVL3 (GOWN DISPOSABLE) ×2
HANDPIECE INTERPULSE COAX TIP (DISPOSABLE) ×1
HOOD PEEL AWAY FACE SHEILD DIS (HOOD) ×4 IMPLANT
KIT BASIN OR (CUSTOM PROCEDURE TRAY) ×2 IMPLANT
KIT ROOM TURNOVER OR (KITS) ×2 IMPLANT
MANIFOLD NEPTUNE II (INSTRUMENTS) ×2 IMPLANT
NEEDLE 22X1 1/2 (OR ONLY) (NEEDLE) ×4 IMPLANT
NS IRRIG 1000ML POUR BTL (IV SOLUTION) ×2 IMPLANT
PACK TOTAL JOINT (CUSTOM PROCEDURE TRAY) ×2 IMPLANT
PAD ARMBOARD 7.5X6 YLW CONV (MISCELLANEOUS) ×4 IMPLANT
SET HNDPC FAN SPRY TIP SCT (DISPOSABLE) ×1 IMPLANT
SUT VIC AB 0 CT1 27 (SUTURE) ×1
SUT VIC AB 0 CT1 27XBRD ANBCTR (SUTURE) ×1 IMPLANT
SUT VIC AB 1 CTX 36 (SUTURE) ×1
SUT VIC AB 1 CTX36XBRD ANBCTR (SUTURE) ×1 IMPLANT
SUT VIC AB 2-0 CT1 27 (SUTURE) ×1
SUT VIC AB 2-0 CT1 TAPERPNT 27 (SUTURE) ×1 IMPLANT
SUT VIC AB 3-0 CT1 27 (SUTURE) ×1
SUT VIC AB 3-0 CT1 TAPERPNT 27 (SUTURE) ×1 IMPLANT
SYR CONTROL 10ML LL (SYRINGE) ×4 IMPLANT
TOWEL OR 17X24 6PK STRL BLUE (TOWEL DISPOSABLE) ×2 IMPLANT
TOWEL OR 17X26 10 PK STRL BLUE (TOWEL DISPOSABLE) ×2 IMPLANT
TRAY CATH 16FR W/PLASTIC CATH (SET/KITS/TRAYS/PACK) IMPLANT
WATER STERILE IRR 1000ML POUR (IV SOLUTION) ×6 IMPLANT

## 2016-04-23 NOTE — Anesthesia Procedure Notes (Signed)
Procedure Name: MAC Date/Time: 04/23/2016 12:10 PM Performed by: Mariea Clonts Pre-anesthesia Checklist: Patient identified, Emergency Drugs available, Patient being monitored, Timeout performed and Suction available Patient Re-evaluated:Patient Re-evaluated prior to inductionOxygen Delivery Method: Nasal cannula

## 2016-04-23 NOTE — Discharge Instructions (Signed)

## 2016-04-23 NOTE — Op Note (Signed)
PATIENT ID:      Casey Liu  MRN:     [id number] DOB/AGE:    [birth year] / 81 y.o.       OPERATIVE REPORT    DATE OF PROCEDURE:  04/23/2016       PREOPERATIVE DIAGNOSIS:   PRIMARY OSTEOARTHRITIS OF THE LEFT KNEE M17.12      Estimated body mass index is 28.44 kg/m as calculated from the following:   Height as of this encounter: 5\' 5"  (1.651 m).   Weight as of this encounter: 170 lb 14.4 oz (77.5 kg).                                                        POSTOPERATIVE DIAGNOSIS:   PRIMARY OSTEOARTHRITIS OF THE LEFT KNEE M17.12                                                                      PROCEDURE:  Procedure(s): LEFT TOTAL KNEE ARTHROPLASTY Using DepuyAttune RP implants #5L Femur, #5Tibia, 5 mm Attune RP bearing, 35 Patella     SURGEON: Jamere Stidham J    ASSISTANT:   [employer]   (Present and scrubbed throughout the case, critical for assistance with exposure, retraction, instrumentation, and closure.)         ANESTHESIA: Spinal, 20cc Exparel, 50cc 0.25% Marcaine  EBL: 300  FLUID REPLACEMENT: 1500 crystalloid  TOURNIQUET TIME: 64min  Drains: None  Tranexamic Acid: 2gm topical   COMPLICATIONS:  None         INDICATIONS FOR PROCEDURE: The patient has  PRIMARY OSTEOARTHRITIS OF THE LEFT KNEE M17.12, Var deformities, XR shows bone on bone arthritis, lateral subluxation of tibia. Patient has failed all conservative measures including anti-inflammatory medicines, narcotics, attempts at  exercise and weight loss, cortisone injections and viscosupplementation.  Risks and benefits of surgery have been discussed, questions answered.   DESCRIPTION OF PROCEDURE: The patient identified by armband, received  IV antibiotics, in the holding area at [facility]. Patient taken to the operating room, appropriate anesthetic  monitors were attached, and Spinal anesthesia was  induced. Tourniquet  applied high to the operative thigh. Lateral post and foot positioner   applied to the table, the lower extremity was then prepped and draped  in usual sterile fashion from the toes to the tourniquet. Time-out procedure was performed. We began the operation, with the knee flexed 120 degrees, by making the anterior midline incision starting at handbreadth above the patella going over the patella 1 cm medial to and 4 cm distal to the tibial tubercle. Small bleeders in the skin and the  subcutaneous tissue identified and cauterized. Transverse retinaculum was incised and reflected medially and a medial parapatellar arthrotomy was accomplished. the patella was everted and theprepatellar fat pad resected. The superficial medial collateral  ligament was then elevated from anterior to posterior along the proximal  flare of the tibia and anterior half of the menisci resected. The knee was hyperflexed exposing bone on bone arthritis. Peripheral and notch osteophytes as well as the cruciate ligaments were then resected.  We continued to  work our way around posteriorly along the proximal tibia, and externally  rotated the tibia subluxing it out from underneath the femur. A McHale  retractor was placed through the notch and a lateral Hohmann retractor  placed, and we then drilled through the proximal tibia in line with the  axis of the tibia followed by an intramedullary guide rod and 2-degree  posterior slope cutting guide. The tibial cutting guide, 3 degree posterior sloped, was pinned into place allowing resection of 3 mm of bone medially and 10 mm of bone laterally. Satisfied with the tibial resection, we then  entered the distal femur 2 mm anterior to the PCL origin with the  intramedullary guide rod and applied the distal femoral cutting guide  set at 9 mm, with 5 degrees of valgus. This was pinned along the  epicondylar axis. At this point, the distal femoral cut was accomplished without difficulty. We then sized for a #5L femoral component and pinned the guide in 3 degrees  of external rotation. The chamfer cutting guide was pinned into place. The anterior, posterior, and chamfer cuts were accomplished without difficulty followed by  the Attune RP box cutting guide and the box cut. We also removed posterior osteophytes from the posterior femoral condyles. At this  time, the knee was brought into full extension. We checked our  extension and flexion gaps and found them symmetric for a 5 mm bearing. Distracting in extension with a lamina spreader, the posterior horns of the menisci were removed, and Exparel, diluted to 60 cc, with 20cc NS, and 20cc 0.5% Marcaine,was injected into the capsule and synovium of the knee. The posterior patella cut was accomplished with the 9.5 mm Attune cutting guide, sized for a 70mm dome, and the fixation pegs drilled.The knee  was then once again hyperflexed exposing the proximal tibia. We sized for a # 5 tibial base plate, applied the smokestack and the conical reamer followed by the the Delta fin keel punch. We then hammered into place the Attune RP trial femoral component, drilled the lugs, inserted a  5 mm trial bearing, trial patellar button, and took the knee through range of motion from 0-130 degrees. No thumb pressure was required for patellar Tracking. At this point, the limb was wrapped with an Esmarch bandage and the tourniquet inflated to 350 mmHg. All trial components were removed, mating surfaces irrigated with pulse lavage, and dried with suction and sponges. A double batch of DePuy HV cement with 1500 mg of Zinacef was mixed and applied to all bony metallic mating surfaces except for the posterior condyles of the femur itself. In order, we  hammered into place the tibial tray and removed excess cement, the femoral component and removed excess cement. The final Attune RP bearing  was inserted, and the knee brought to full extension with compression.  The patellar button was clamped into place, and excess cement  removed. While the  cement cured the wound was irrigated out with normal saline solution pulse lavage. Ligament stability and patellar tracking were checked and found to be excellent. The parapatellar arthrotomy was closed with  running #1 Vicryl suture. The subcutaneous tissue with 0 and 2-0 undyed  Vicryl suture, and the skin with running 3-0 SQ vicryl. A dressing of Xeroform,  4 x 4, dressing sponges, Webril, and Ace wrap applied. The patient  awakened, and taken to recovery room without difficulty.   Raizel Wesolowski J 04/23/2016, 1:45 PM

## 2016-04-23 NOTE — Transfer of Care (Signed)
Immediate Anesthesia Transfer of Care Note  Patient: Casey Liu  Procedure(s) Performed: Procedure(s): LEFT TOTAL KNEE ARTHROPLASTY (Left)  Patient Location: PACU  Anesthesia Type:Spinal  Level of Consciousness: awake, alert , oriented and patient cooperative  Airway & Oxygen Therapy: Patient Spontanous Breathing and Patient connected to nasal cannula oxygen  Post-op Assessment: Report given to RN and Post -op Vital signs reviewed and stable  Post vital signs: Reviewed and stable  Last Vitals:  Vitals:   04/23/16 1200 04/23/16 1430  BP:    Pulse: 66 (!) 58  Resp: 15 14  Temp:  (!) 36 C    Last Pain:  Vitals:   04/23/16 1045  TempSrc: Oral         Complications: No apparent anesthesia complications

## 2016-04-23 NOTE — Interval H&P Note (Signed)
H&P Updated, no change.

## 2016-04-23 NOTE — Anesthesia Procedure Notes (Signed)
Anesthesia Regional Block:  Adductor canal block  Pre-Anesthetic Checklist: ,, timeout performed, Correct Patient, Correct Site, Correct Laterality, Correct Procedure, Correct Position, site marked, Risks and benefits discussed,  Surgical consent,  Pre-op evaluation,  At surgeon's request and post-op pain management  Laterality: Left  Prep: chloraprep       Needles:  Injection technique: Single-shot  Needle Type: Echogenic Needle     Needle Length: 9cm 9 cm Needle Gauge: 21 and 21 G    Additional Needles:  Procedures: ultrasound guided (picture in chart) Adductor canal block Narrative:  Start time: 04/23/2016 12:04 PM End time: 04/23/2016 12:06 PM Injection made incrementally with aspirations every 5 mL.  Performed by: Personally  Anesthesiologist: Suella Broad D  Additional Notes: Tolerated well. No immediate complications.

## 2016-04-23 NOTE — Anesthesia Procedure Notes (Signed)
Spinal  Patient location during procedure: OR Start time: 04/23/2016 12:29 PM End time: 04/23/2016 12:31 PM Staffing Anesthesiologist: Suella Broad D Performed: anesthesiologist  Preanesthetic Checklist Completed: patient identified, site marked, surgical consent, pre-op evaluation, timeout performed, IV checked, risks and benefits discussed and monitors and equipment checked Spinal Block Patient position: sitting Prep: Betadine Patient monitoring: heart rate, continuous pulse ox, blood pressure and cardiac monitor Approach: midline Location: L4-5 Injection technique: single-shot Needle Needle type: Introducer and Pencan  Needle gauge: 24 G Needle length: 9 cm Additional Notes Negative paresthesia. Negative blood return. Positive free-flowing CSF. Expiration date of kit checked and confirmed. Patient tolerated procedure well, without complications.

## 2016-04-23 NOTE — Progress Notes (Signed)
Orthopedic Tech Progress Note Patient Details:  Casey Liu Apr 18, 1935 [id number]  CPM Left Knee CPM Left Knee: On Left Knee Flexion (Degrees): 40 Left Knee Extension (Degrees): 0 Additional Comments: trapeze bar patient helper   Hildred Priest 04/23/2016, 3:14 PM Viewed order from doctor's order list

## 2016-04-23 NOTE — Anesthesia Preprocedure Evaluation (Signed)
Anesthesia Evaluation  Patient identified by MRN, date of birth, ID band Patient awake    Reviewed: Allergy & Precautions, NPO status , Patient's Chart, lab work & pertinent test results, reviewed documented beta blocker date and time   Airway Mallampati: II  TM Distance: >3 FB Neck ROM: Full    Dental  (+) Dental Advisory Given   Pulmonary sleep apnea and Continuous Positive Airway Pressure Ventilation ,    breath sounds clear to auscultation       Cardiovascular hypertension, Pt. on medications and Pt. on home beta blockers  Rhythm:Regular Rate:Normal     Neuro/Psych  Headaches, negative psych ROS   GI/Hepatic Neg liver ROS, GERD  Medicated,  Endo/Other  diabetes, Type 2, Oral Hypoglycemic Agents  Renal/GU   negative genitourinary   Musculoskeletal  (+) Arthritis ,   Abdominal   Peds negative pediatric ROS (+)  Hematology negative hematology ROS (+)   Anesthesia Other Findings   Reproductive/Obstetrics negative OB ROS                             Lab Results  Component Value Date   WBC 9.1 04/15/2016   HGB 13.6 04/15/2016   HCT 44.0 04/15/2016   MCV 90.0 04/15/2016   PLT 278 04/15/2016   Lab Results  Component Value Date   CREATININE 0.78 04/15/2016   BUN 22 (H) 04/15/2016   NA 138 04/15/2016   K 4.6 04/15/2016   CL 107 04/15/2016   CO2 20 (L) 04/15/2016   Lab Results  Component Value Date   INR 1.05 04/15/2016   INR 1.04 11/05/2015    Anesthesia Physical  Anesthesia Plan  ASA: II  Anesthesia Plan: Spinal   Post-op Pain Management:  Regional for Post-op pain   Induction: Intravenous  Airway Management Planned: Natural Airway and Simple Face Mask  Additional Equipment:   Intra-op Plan:   Post-operative Plan:   Informed Consent: I have reviewed the patients History and Physical, chart, labs and discussed the procedure including the risks, benefits and  alternatives for the proposed anesthesia with the patient or authorized representative who has indicated his/her understanding and acceptance.   Dental advisory given and Consent reviewed with POA  Plan Discussed with: CRNA  Anesthesia Plan Comments:         Anesthesia Quick Evaluation

## 2016-04-24 ENCOUNTER — Encounter (HOSPITAL_COMMUNITY): Payer: Self-pay | Admitting: Orthopedic Surgery

## 2016-04-24 LAB — BASIC METABOLIC PANEL
Anion gap: 9 (ref 5–15)
BUN: 15 mg/dL (ref 6–20)
CO2: 23 mmol/L (ref 22–32)
Calcium: 8.8 mg/dL — ABNORMAL LOW (ref 8.9–10.3)
Chloride: 106 mmol/L (ref 101–111)
Creatinine, Ser: 0.81 mg/dL (ref 0.44–1.00)
GFR calc Af Amer: >60 mL/min (ref >60)
GFR calc non Af Amer: >60 mL/min (ref >60)
Glucose, Bld: 125 mg/dL — ABNORMAL HIGH (ref 65–99)
Potassium: 4.1 mmol/L (ref 3.5–5.1)
Sodium: 138 mmol/L (ref 135–145)

## 2016-04-24 LAB — CBC
HCT: 34.1 % — ABNORMAL LOW (ref 36.0–46.0)
Hemoglobin: 10.7 g/dL — ABNORMAL LOW (ref 12.0–15.0)
MCH: 28.1 pg (ref 26.0–34.0)
MCHC: 31.4 g/dL (ref 30.0–36.0)
MCV: 89.5 fL (ref 78.0–100.0)
Platelets: 225 10*3/uL (ref 150–400)
RBC: 3.81 MIL/uL — ABNORMAL LOW (ref 3.87–5.11)
RDW: 15.6 % — ABNORMAL HIGH (ref 11.5–15.5)
WBC: 8.9 10*3/uL (ref 4.0–10.5)

## 2016-04-24 LAB — GLUCOSE, CAPILLARY: Glucose-Capillary: 127 mg/dL — ABNORMAL HIGH (ref 65–99)

## 2016-04-24 NOTE — Progress Notes (Signed)
Orthopedic Tech Progress Note Patient Details:  Casey Liu [birth year] [id number]  Patient ID: Casey Liu, female   DOB: [birth year], 81 y.o.   MRN: [id number] Applied cpm 0-40  Karolee Stamps 04/24/2016, 5:56 AM

## 2016-04-24 NOTE — Progress Notes (Signed)
PATIENT ID: Casey Liu  MRN: [id number]  DOB/AGE:  [birth year] / 81 y.o.  1 Day Post-Op Procedure(s) (LRB): LEFT TOTAL KNEE ARTHROPLASTY (Left)    PROGRESS NOTE Subjective: Patient is alert, oriented, no Nausea, no Vomiting, yes passing gas. Taking PO well. Denies SOB, Chest or Calf Pain. Using Incentive Spirometer, PAS in place. Ambulate WBAT, CPM 0-40 Patient reports pain as 3/10 .    Objective: Vital signs in last 24 hours: Vitals:   04/23/16 1700 04/23/16 2000 04/24/16 0005 04/24/16 0400  BP: 138/71 (!) 145/57 (!) 135/51 (!) 136/48  Pulse: 62 66 70 67  Resp: 16 16 16 16   Temp: 97.7 F (36.5 C) 98.9 F (37.2 C) 98.2 F (36.8 C) 98.9 F (37.2 C)  TempSrc:  Oral Oral Oral  SpO2: 98% 98% 96% 98%  Weight:      Height:          Intake/Output from previous day: I/O last 3 completed shifts: In: 1200 [I.V.:1200] Out: 250 [Urine:150; Blood:100]   Intake/Output this shift: No intake/output data recorded.   LABORATORY DATA:  Recent Labs  04/23/16 1136 04/23/16 1433 04/23/16 2148 04/24/16 0315  WBC  --   --   --  8.9  HGB  --   --   --  10.7*  HCT  --   --   --  34.1*  PLT  --   --   --  225  GLUCAP 103* 99 145*  --     Examination: Neurologically intact ABD soft Neurovascular intact Sensation intact distally Intact pulses distally Dorsiflexion/Plantar flexion intact Incision: dressing C/D/I No cellulitis present Compartment soft}  Assessment:   1 Day Post-Op Procedure(s) (LRB): LEFT TOTAL KNEE ARTHROPLASTY (Left) ADDITIONAL DIAGNOSIS: Expected Acute Blood Loss Anemia, Hypertension  Plan: PT/OT WBAT, CPM 5/hrs day until ROM 0-90 degrees, then D/C CPM DVT Prophylaxis:  SCDx72hrs, ASA 325 mg BID x 2 weeks DISCHARGE PLAN: Home DISCHARGE NEEDS: HHPT, CPM, Walker and 3-in-1 comode seat     Casey Liu J 04/24/2016, 6:16 AM

## 2016-04-24 NOTE — Plan of Care (Signed)
Problem: Pain Managment: Goal: General experience of comfort will improve Outcome: Progressing Medicated once for pain with full relief  Problem: Physical Regulation: Goal: Will remain free from infection Outcome: Progressing No s/s of infection   Problem: Tissue Perfusion: Goal: Risk factors for ineffective tissue perfusion will decrease Outcome: Progressing No S/S of DVT noted  Problem: Bowel/Gastric: Goal: Will not experience complications related to bowel motility Outcome: Progressing No bowel issues noted

## 2016-04-24 NOTE — Anesthesia Postprocedure Evaluation (Signed)
Anesthesia Post Note  Patient: Casey Liu  Procedure(s) Performed: Procedure(s) (LRB): LEFT TOTAL KNEE ARTHROPLASTY (Left)  Patient location during evaluation: PACU Anesthesia Type: Spinal and Regional Level of consciousness: oriented and awake and alert Pain management: pain level controlled Vital Signs Assessment: post-procedure vital signs reviewed and stable Respiratory status: spontaneous breathing, respiratory function stable and patient connected to nasal cannula oxygen Cardiovascular status: blood pressure returned to baseline and stable Postop Assessment: no headache and no backache Anesthetic complications: no    Last Vitals:  Vitals:   04/24/16 0005 04/24/16 0400  BP: (!) 135/51 (!) 136/48  Pulse: 70 67  Resp: 16 16  Temp: 36.8 C 37.2 C    Last Pain:  Vitals:   04/24/16 0839  TempSrc:   PainSc: [name]

## 2016-04-24 NOTE — Care Management Note (Signed)
Case Management Note  Patient Details  Name: Casey Liu MRN: [id number] Date of Birth: May 23, 1935  Subjective/Objective:  81 yr old female s/p left total knee arthroplasty.                    Action/Plan: Patient was preoperatively setup with [facility], no changes. CPM to be delivered to patient's home. Patient speaks spanish only, her daughter stays with her, she will have family support at discharge.   Expected Discharge Date:   04/25/16               Expected Discharge Plan:  [place]  In-House Referral:     Discharge planning Services  CM Consult  Post Acute Care Choice:  Home Health, Durable Medical Equipment Choice offered to:  Patient  DME Arranged:  CPM (Patient has rolling walker and 3in1) DME Agency:  [employer]  HH Arranged:  PT [place]:  [facility] (now [facility])  Status of Service:  Completed, signed off  If discussed at [place] of Stay Meetings, dates discussed:    Additional Comments:  Ninfa Meeker, RN 04/24/2016, 2:25 PM

## 2016-04-24 NOTE — Progress Notes (Signed)
Orthopedic Tech Progress Note Patient Details:  Casey Liu 02/07/1935 [id number]  Patient ID: Casey Liu, female   DOB: [birth year], 81 y.o.   MRN: [id number]   Hildred Priest 04/24/2016, 1:49 PM Placed pt's lle on cpm@1345 ;RN notified

## 2016-04-24 NOTE — Evaluation (Signed)
Occupational Therapy Evaluation Patient Details Name: Casey Liu MRN: [id number] DOB: 29-Mar-1935 Today's Date: 04/24/2016    History of Present Illness Pt is a 81 y.o. female now s/p Lt TKA. PMH: stroke, HTN, diabetes, Rt TKA 4/17.    Clinical Impression   Pt admitted with the above diagnoses and presents with below problem list. Pt will benefit from continued acute OT to address the below listed deficits and maximize independence with basic ADLs prior to d/c home with family. PTA pt was independent with ADLs. Pt is currently min guard to min A with LB ADLs and functional transfers/mobility. Daughter present and involved in session.       Follow Up Recommendations  No OT follow up;Supervision - Intermittent;Other (comment) (OOB/mobility)    Equipment Recommendations  None recommended by OT    Recommendations for Other Services       Precautions / Restrictions Precautions Precautions: Knee;Fall Precaution Booklet Issued: No Precaution Comments: reviewed precautions Restrictions Weight Bearing Restrictions: Yes LLE Weight Bearing: Weight bearing as tolerated      Mobility Bed Mobility Overal bed mobility: Needs Assistance Bed Mobility: Sit to Supine     Supine to sit: Min assist Sit to supine: Min assist   General bed mobility comments: assist to advance LLE  Transfers Overall transfer level: Needs assistance Equipment used: Rolling walker (2 wheeled) Transfers: Sit to/from Stand Sit to Stand: Min guard         General transfer comment: cues for hand placement    Balance Overall balance assessment: Needs assistance Sitting-balance support: No upper extremity supported;Feet supported Sitting balance-Leahy Scale: Good     Standing balance support: Bilateral upper extremity supported;During functional activity Standing balance-Leahy Scale: Poor Standing balance comment: using rw                            ADL Overall ADL's :  Needs assistance/impaired Eating/Feeding: Set up;Sitting   Grooming: Min guard;Standing   Upper Body Bathing: Set up;Sitting   Lower Body Bathing: Min guard;Sit to/from stand;With adaptive equipment   Upper Body Dressing : Set up;Sitting   Lower Body Dressing: Min guard;With adaptive equipment;Sit to/from stand   Toilet Transfer: Min guard;Ambulation;RW (3n1 over toilet)   Toileting- Clothing Manipulation and Hygiene: Min guard;Sit to/from stand   Tub/ Shower Transfer: Walk-in shower;Min guard;Ambulation;3 in 1;Rolling walker   Functional mobility during ADLs: Min guard;Rolling walker General ADL Comments: Pt completed toilet transfer and bed mobility during session as detailed above. Daughter present. Reviewed ADL education. Improved LLE WB tolteration with cues and time during OOB mobility.      Vision     Perception     Praxis      Pertinent Vitals/Pain Pain Assessment: Faces Pain Score: 3  Faces Pain Scale: Hurts little more Pain Location: Lt thigh at rest Pain Descriptors / Indicators: Aching Pain Intervention(s): Limited activity within patient's tolerance;Monitored during session;Repositioned     Hand Dominance     Extremity/Trunk Assessment Upper Extremity Assessment Upper Extremity Assessment: Overall WFL for tasks assessed   Lower Extremity Assessment Lower Extremity Assessment: Defer to PT evaluation LLE Deficits / Details: unable to perform SLR       Communication Communication Communication: Prefers language other than English   Cognition Arousal/Alertness: Awake/alert Behavior During Therapy: WFL for tasks assessed/performed Overall Cognitive Status: Within Functional Limits for tasks assessed  General Comments       Exercises       Shoulder Instructions      Home Living Family/patient expects to be discharged to:: Private residence Living Arrangements: Children Available Help at Discharge: Family;Available  24 hours/day Type of Home: House Home Access: Stairs to enter [employer] of Steps: 1 Entrance Stairs-Rails: None Home Layout: Two level;Able to live on main level with bedroom/bathroom     Bathroom Shower/Tub: Walk-in shower;Tub/shower unit   Bathroom Toilet: Standard     Home Equipment: Environmental consultant - 2 wheels;Bedside commode;Cane - single point;Adaptive equipment Adaptive Equipment: Reacher Additional Comments: previous R knee surgery this year      Prior Functioning/Environment Level of Independence: Independent with assistive device(s)        Comments: occasional use of SPC with ambulation        OT Problem List: Decreased activity tolerance;Impaired balance (sitting and/or standing);Decreased knowledge of use of DME or AE;Decreased knowledge of precautions;Pain   OT Treatment/Interventions: Self-care/ADL training;DME and/or AE instruction;Therapeutic activities;Patient/family education;Balance training    OT Goals(Current goals can be found in the care plan section) Acute Rehab OT Goals Patient Stated Goal: get home OT Goal Formulation: With patient/family Time For Goal Achievement: 05/01/16 Potential to Achieve Goals: Good ADL Goals Pt Will Perform Lower Body Bathing: with modified independence;with adaptive equipment;sit to/from stand Pt Will Perform Lower Body Dressing: with modified independence;with adaptive equipment;sit to/from stand Pt Will Transfer to Toilet: with modified independence;ambulating Pt Will Perform Toileting - Clothing Manipulation and hygiene: with modified independence;sitting/lateral leans;sit to/from stand Pt Will Perform Tub/Shower Transfer: Shower transfer;with modified independence;ambulating;3 in 1;rolling walker  OT Frequency: Min 2X/week   Barriers to D/C:            Co-evaluation              End of Session Equipment Utilized During Treatment: Gait belt;Rolling walker CPM Left Knee CPM Left Knee: Off  Activity  Tolerance: Patient tolerated treatment well Patient left: in bed;with call bell/phone within reach;with family/visitor present;Other (comment) (rolled blanker under L heel, unable to tolerate bone foam)   Time: [id number] OT Time Calculation (min): 20 min Charges:  OT General Charges $OT Visit: 1 Procedure OT Evaluation $OT Eval Low Complexity: 1 Procedure G-Codes:    Hortencia Pilar 2016-05-12, 12:39 PM

## 2016-04-24 NOTE — Evaluation (Signed)
Physical Therapy Evaluation Patient Details Name: Casey Liu MRN: [id number] DOB: 06-25-1935 Today's Date: 04/24/2016   History of Present Illness  Pt is a 80 y.o. female now s/p Lt TKA. PMH: stroke, HTN, diabetes, Rt TKA 4/17.   Clinical Impression  Pt is s/p TKA resulting in the deficits listed below (see PT Problem List). Pt able to ambulate 45 ft with rw and min guard assistance. Pt will benefit from skilled PT to increase their independence and safety with mobility to allow discharge to home with family support.       Follow Up Recommendations Home health PT;Supervision for mobility/OOB    Equipment Recommendations  None recommended by PT (pt reports having all needed DME)    Recommendations for Other Services       Precautions / Restrictions Precautions Precautions: Knee;Fall Precaution Booklet Issued: No Precaution Comments: reviewed knee extension precautions Restrictions Weight Bearing Restrictions: Yes LLE Weight Bearing: Weight bearing as tolerated      Mobility  Bed Mobility Overal bed mobility: Needs Assistance Bed Mobility: Supine to Sit     Supine to sit: Min assist     General bed mobility comments: needing min assist with LLE for sitting EOB  Transfers Overall transfer level: Needs assistance Equipment used: Rolling walker (2 wheeled) Transfers: Sit to/from Stand Sit to Stand: Min guard         General transfer comment: cues for hand placement  Ambulation/Gait Ambulation/Gait assistance: Min guard Ambulation Distance (Feet): 45 Feet (plus 12) Assistive device: Rolling walker (2 wheeled) Gait Pattern/deviations: Step-to pattern;Decreased weight shift to left Gait velocity: decreased   General Gait Details: encouraging weightbearing throught LLE  Stairs            Wheelchair Mobility    Modified Rankin (Stroke Patients Only)       Balance Overall balance assessment: Needs assistance Sitting-balance support: No  upper extremity supported Sitting balance-Leahy Scale: Good     Standing balance support: Bilateral upper extremity supported Standing balance-Leahy Scale: Poor Standing balance comment: using rw                             Pertinent Vitals/Pain Pain Assessment: Faces Faces Pain Scale: Hurts little more Pain Location: Lt thigh Pain Descriptors / Indicators: Aching Pain Intervention(s): Limited activity within patient's tolerance;Monitored during session    Home Living Family/patient expects to be discharged to:: Private residence Living Arrangements: Children Available Help at Discharge: Family;Available 24 hours/day Type of Home: House Home Access: Stairs to enter Entrance Stairs-Rails: None Entrance Stairs-Number of Steps: 1 Home Layout: Two level;Able to live on main level with bedroom/bathroom Home Equipment: Gilford Rile - 2 wheels;Bedside commode;Cane - single point      Prior Function Level of Independence: Independent with assistive device(s)         Comments: occasional use of SPC with ambulation     Hand Dominance        Extremity/Trunk Assessment   Upper Extremity Assessment: Overall WFL for tasks assessed           Lower Extremity Assessment: LLE deficits/detail   LLE Deficits / Details: unable to perform SLR     Communication   Communication: Prefers language other than English  Cognition Arousal/Alertness: Awake/alert Behavior During Therapy: WFL for tasks assessed/performed Overall Cognitive Status: Within Functional Limits for tasks assessed  General Comments      Exercises     Assessment/Plan    PT Assessment Patient needs continued PT services  PT Problem List Decreased strength;Decreased range of motion;Decreased activity tolerance;Decreased balance;Decreased mobility          PT Treatment Interventions DME instruction;Gait training;Stair training;Functional mobility training;Therapeutic  activities;Therapeutic exercise;Patient/family education    PT Goals (Current goals can be found in the Care Plan section)  Acute Rehab PT Goals Patient Stated Goal: get home PT Goal Formulation: With patient/family Time For Goal Achievement: 05/08/16 Potential to Achieve Goals: Good    Frequency 7X/week   Barriers to discharge        Co-evaluation               End of Session Equipment Utilized During Treatment: Gait belt Activity Tolerance: Patient tolerated treatment well Patient left: in chair;with call bell/phone within reach;with nursing/sitter in room;with family/visitor present (in bone foam) Nurse Communication: Mobility status;Weight bearing status    Functional Assessment Tool Used: clinical judgment Functional Limitation: Mobility: Walking and moving around Mobility: Walking and Moving Around Current Status [id number]): At least 20 percent but less than 40 percent impaired, limited or restricted Mobility: Walking and Moving Around Goal Status [id number]): At least 1 percent but less than 20 percent impaired, limited or restricted    Time: 0934-0959 PT Time Calculation (min) (ACUTE ONLY): 25 min   Charges:   PT Evaluation $PT Eval Moderate Complexity: 1 Procedure PT Treatments $Gait Training: 8-22 mins   PT G Codes:   PT G-Codes **NOT FOR INPATIENT CLASS** Functional Assessment Tool Used: clinical judgment Functional Limitation: Mobility: Walking and moving around Mobility: Walking and Moving Around Current Status [id number]): At least 20 percent but less than 40 percent impaired, limited or restricted Mobility: Walking and Moving Around Goal Status [phone]): At least 1 percent but less than 20 percent impaired, limited or restricted    Cassell Clement, PT, CSCS Pager [phone] Office [phone]  04/24/2016, 10:09 AM

## 2016-04-24 NOTE — Progress Notes (Signed)
PT Progress Note    Pt is making progress with mobility during PT sessions, able to ambulate 90 feet during PT session with 2 episodes of Rt knee mild buckling with independent recovery. Family present and assisting with communication throughout session. Anticipate pt will D/C to home with family support following her acute stay.     04/24/16 1436  PT Visit Information  Last PT Received On 04/24/16  Assistance Needed +1  History of Present Illness Pt is a 81 y.o. female now s/p Lt TKA. PMH: stroke, HTN, diabetes, Rt TKA 4/17.   Subjective Data  Subjective Knee is still really sore.   Patient Stated Goal get some rest  Precautions  Precautions Knee;Fall  Precaution Booklet Issued No  Precaution Comments reviewed precautions  Restrictions  Weight Bearing Restrictions Yes  LLE Weight Bearing WBAT  Pain Assessment  Pain Assessment Faces  Faces Pain Scale 4  Pain Location Lt knee  Pain Descriptors / Indicators Aching;Grimacing;Guarding  Pain Intervention(s) Limited activity within patient's tolerance;Monitored during session  Cognition  Arousal/Alertness Awake/alert  Behavior During Therapy WFL for tasks assessed/performed  Overall Cognitive Status Within Functional Limits for tasks assessed  Bed Mobility  Overal bed mobility Needs Assistance  Bed Mobility Supine to Sit;Sit to Supine  Supine to sit Min assist  Sit to supine Min assist  General bed mobility comments min assist needed with LLE for getting in/out of bed.   Transfers  Overall transfer level Needs assistance  Equipment used Rolling walker (2 wheeled)  Transfers Sit to/from Stand  Sit to Stand Min guard  General transfer comment cues for hand placement  Ambulation/Gait  Ambulation/Gait assistance Min guard  Ambulation Distance (Feet) 90 Feet  Assistive device Rolling walker (2 wheeled)  Gait Pattern/deviations Step-through pattern;Decreased step length - right;Decreased stance time - left;Decreased weight shift to  left  General Gait Details encouraging weightbearing throught LLE and even strides  Gait velocity decreased  Balance  Overall balance assessment Needs assistance  Sitting-balance support No upper extremity supported  Sitting balance-Leahy Scale Good  Standing balance support Bilateral upper extremity supported  Standing balance-Leahy Scale Poor  Standing balance comment using rw  Exercises  Exercises Total Joint  Total Joint Exercises  Ankle Circles/Pumps AROM;Both;15 reps  Quad Sets Strengthening;Left;10 reps  Heel Slides AAROM;Left;10 reps  Straight Leg Raises Strengthening;Left;10 reps (mod assist)  Goniometric ROM knee flexion 75 degrees  PT - End of Session  Equipment Utilized During Treatment Gait belt  Activity Tolerance Patient tolerated treatment well  Patient left in bed;with call bell/phone within reach;in CPM;with family/visitor present  Nurse Communication Mobility status;Weight bearing status  PT - Assessment/Plan  PT Plan Current plan remains appropriate  PT Frequency (ACUTE ONLY) 7X/week  Follow Up Recommendations Home health PT;Supervision for mobility/OOB  PT equipment None recommended by PT  PT Goal Progression  Progress towards PT goals Progressing toward goals  Acute Rehab PT Goals  PT Goal Formulation With patient/family  Time For Goal Achievement 05/08/16  Potential to Achieve Goals Good  PT Time Calculation  PT Start Time (ACUTE ONLY) 1407  PT Stop Time (ACUTE ONLY) 1434  PT Time Calculation (min) (ACUTE ONLY) 27 min  PT General Charges  $$ ACUTE PT VISIT 1 Procedure  PT Treatments  $Gait Training 8-22 mins  $Therapeutic Exercise 8-22 mins  Cassell Clement, PT, CSCS Pager [phone] Office [phone]

## 2016-04-25 LAB — CBC
HCT: 33 % — ABNORMAL LOW (ref 36.0–46.0)
Hemoglobin: 10.3 g/dL — ABNORMAL LOW (ref 12.0–15.0)
MCH: 27.7 pg (ref 26.0–34.0)
MCHC: 31.2 g/dL (ref 30.0–36.0)
MCV: 88.7 fL (ref 78.0–100.0)
Platelets: 162 10*3/uL (ref 150–400)
RBC: 3.72 MIL/uL — ABNORMAL LOW (ref 3.87–5.11)
RDW: 15.8 % — ABNORMAL HIGH (ref 11.5–15.5)
WBC: 10 10*3/uL (ref 4.0–10.5)

## 2016-04-25 MED ORDER — METHOCARBAMOL 500 MG PO TABS: 500.0000 mg | ORAL_TABLET | Freq: Two times a day (BID) | ORAL | 0 refills | Status: DC

## 2016-04-25 NOTE — Progress Notes (Signed)
PATIENT ID: Casey Liu  MRN: [id number]  DOB/AGE:  79/23/1936 / 81 y.o.  2 Days Post-Op Procedure(s) (LRB): LEFT TOTAL KNEE ARTHROPLASTY (Left)    PROGRESS NOTE Subjective: Patient is alert, oriented, no Nausea, no Vomiting, yes passing gas. Taking PO well. Denies SOB, Chest or Calf Pain. Using Incentive Spirometer, PAS in place. Ambulate WBAT with pt walking 90 ft with therapy, CPM 10-40 Patient reports pain as 7/10 .    Objective: Vital signs in last 24 hours: Vitals:   04/24/16 0400 04/24/16 1300 04/24/16 2000 04/25/16 0500  BP: (!) 136/48 (!) 146/55 (!) 156/57 (!) 139/46  Pulse: 67 72 74 77  Resp: 16 16 16 16   Temp: 98.9 F (37.2 C) 97.9 F (36.6 C) 99.1 F (37.3 C) 98 F (36.7 C)  TempSrc: Oral Oral Oral Oral  SpO2: 98% 96% 95% 93%  Weight:      Height:          Intake/Output from previous day: I/O last 3 completed shifts: In: 480 [P.O.:480] Out: -    Intake/Output this shift: No intake/output data recorded.   LABORATORY DATA:  Recent Labs  04/23/16 1433 04/23/16 2148 04/24/16 0315 04/24/16 0521 04/24/16 0647 04/25/16 0540  WBC  --   --  8.9  --   --  10.0  HGB  --   --  10.7*  --   --  10.3*  HCT  --   --  34.1*  --   --  33.0*  PLT  --   --  225  --   --  162  NA  --   --   --  138  --   --   K  --   --   --  4.1  --   --   CL  --   --   --  106  --   --   CO2  --   --   --  23  --   --   BUN  --   --   --  15  --   --   CREATININE  --   --   --  0.81  --   --   GLUCOSE  --   --   --  125*  --   --   GLUCAP 99 145*  --   --  127*  --   CALCIUM  --   --   --  8.8*  --   --     Examination: Neurologically intact Neurovascular intact Sensation intact distally Intact pulses distally Dorsiflexion/Plantar flexion intact Incision: dressing C/D/I No cellulitis present Compartment soft}  Assessment:   2 Days Post-Op Procedure(s) (LRB): LEFT TOTAL KNEE ARTHROPLASTY (Left) ADDITIONAL DIAGNOSIS: Expected Acute Blood Loss Anemia,  Hypertension  Plan: PT/OT WBAT, CPM 5/hrs day until ROM 0-90 degrees, then D/C CPM DVT Prophylaxis:  SCDx72hrs, ASA 325 mg BID x 2 weeks DISCHARGE PLAN: Home DISCHARGE NEEDS: HHPT, CPM, Walker and 3-in-1 comode seat     Warren Lindahl R 04/25/2016, 9:37 AM

## 2016-04-25 NOTE — Progress Notes (Signed)
Physical Therapy Treatment Patient Details Name: Casey Liu MRN: [id number] DOB: 16-Jan-1935 Today's Date: 04/25/2016    History of Present Illness Pt is a 81 y.o. female now s/p Lt TKA. PMH: stroke, HTN, diabetes, Rt TKA 4/17.     PT Comments    Pt performed increased mobility and performed stair training in prep for safe d/c home and entry into home.  Informed RN that patient is now ready to d/c home.  Graciela used for interpretation during functional mobility.    Follow Up Recommendations  Home health PT;Supervision for mobility/OOB     Equipment Recommendations  None recommended by PT    Recommendations for Other Services       Precautions / Restrictions Precautions Precautions: Knee;Fall Precaution Booklet Issued: Yes (comment) Precaution Comments: reviewed precautions Restrictions Weight Bearing Restrictions: No LLE Weight Bearing: Weight bearing as tolerated    Mobility  Bed Mobility               General bed mobility comments: Pt received in recliner on arrival.    Transfers Overall transfer level: Needs assistance Equipment used: Rolling walker (2 wheeled) Transfers: Sit to/from Stand Sit to Stand: Modified independent (Device/Increase time)         General transfer comment: Good technique observed.    Ambulation/Gait Ambulation/Gait assistance: Supervision   Assistive device: Rolling walker (2 wheeled) Gait Pattern/deviations: Step-through pattern;Decreased dorsiflexion - left Gait velocity: decreased Gait velocity interpretation: Below normal speed for age/gender General Gait Details: Cues for L heel strike to encourage knee extension in stance phase.     Stairs Stairs: Yes Stairs assistance: Min guard Stair Management: No rails;With walker Number of Stairs: 5 General stair comments: Pt performed curb training multiple attempts to improve technique for safe entry into home.  Cues for sequencing and RW placement.     Wheelchair Mobility    Modified Rankin (Stroke Patients Only)       Balance Overall balance assessment: Needs assistance   Sitting balance-Leahy Scale: Good       Standing balance-Leahy Scale: Poor                      Cognition Arousal/Alertness: Awake/alert Behavior During Therapy: WFL for tasks assessed/performed Overall Cognitive Status: Within Functional Limits for tasks assessed                      Exercises Total Joint Exercises Ankle Circles/Pumps: AROM;Both;10 reps;Supine Quad Sets: Strengthening;Left;10 reps;AROM;Supine Short Arc Quad: AROM;Left;10 reps;Supine Heel Slides: AAROM;Left;10 reps;Supine Hip ABduction/ADduction: AAROM;Left;10 reps;Supine Straight Leg Raises: AAROM;Left;10 reps;Supine Long Arc Quad: AAROM;Left;10 reps;Supine Goniometric ROM: 75 degrees knee flexion L knee.     General Comments        Pertinent Vitals/Pain Pain Assessment: Faces Faces Pain Scale: Hurts little more Pain Location: L knee Pain Descriptors / Indicators: Aching;Burning;Grimacing;Guarding Pain Intervention(s): Monitored during session;Repositioned;Ice applied    Home Living                      Prior Function            PT Goals (current goals can now be found in the care plan section) Acute Rehab PT Goals Patient Stated Goal: go home Potential to Achieve Goals: Good Progress towards PT goals: Progressing toward goals    Frequency    7X/week      PT Plan Current plan remains appropriate    Co-evaluation  End of Session Equipment Utilized During Treatment: Gait belt Activity Tolerance: Patient tolerated treatment well Patient left: in bed;with call bell/phone within reach;in CPM;with family/visitor present     Time: [id number] PT Time Calculation (min) (ACUTE ONLY): 28 min  Charges:  $Gait Training: 8-22 mins $Therapeutic Exercise: 8-22 mins                    G Codes:      Cristela Blue 04-29-16, 12:28 PM  Governor Rooks, PTA pager [phone]

## 2016-04-25 NOTE — Discharge Summary (Signed)
Patient ID: Casey Liu MRN: [id number] DOB/AGE: [birth year] 79 y.o.  Admit date: 04/23/2016 Discharge date: 04/25/2016  Admission Diagnoses:  Principal Problem:   Primary osteoarthritis of left knee Active Problems:   Primary localized osteoarthritis of left knee   Discharge Diagnoses:  Same  Past Medical History:  Diagnosis Date  . Arthritis   . Cataract    right  . Diabetes mellitus without complication ([place])   . Family history of anesthesia complication    mother had "three heart attacks after anesthesia"  . Headache(784.0)    hx of  . History of stomach ulcers   . Hypertension    Dr. Jonelle Sidle [phone]  . Pneumonia    hx of   . Seasonal allergies   . Sleep apnea    cpap  . Stroke [facility])    left side weakness  . Urinary tract infection    hx of    Surgeries: Procedure(s): LEFT TOTAL KNEE ARTHROPLASTY on 04/23/2016   Consultants:   Discharged Condition: Improved  Hospital Course: Casey Liu is an 81 y.o. female who was admitted 04/23/2016 for operative treatment ofPrimary osteoarthritis of left knee. Patient has severe unremitting pain that affects sleep, daily activities, and work/hobbies. After pre-op clearance the patient was taken to the operating room on 04/23/2016 and underwent  Procedure(s): LEFT TOTAL KNEE ARTHROPLASTY.    Patient was given perioperative antibiotics: Anti-infectives    Start     Dose/Rate Route Frequency Ordered Stop   04/23/16 1100  ceFAZolin (ANCEF) IVPB 2g/100 mL premix     2 g 200 mL/hr over 30 Minutes Intravenous To ShortStay Surgical 04/23/16 1044 04/23/16 1224       Patient was given sequential compression devices, early ambulation, and chemoprophylaxis to prevent DVT.  Patient benefited maximally from hospital stay and there were no complications.    Recent vital signs: Patient Vitals for the past 24 hrs:  BP Temp Temp src Pulse Resp SpO2  04/25/16 0500 (!) 139/46 98 F (36.7 C) Oral 77 16 93 %   04/24/16 2000 (!) 156/57 99.1 F (37.3 C) Oral 74 16 95 %  04/24/16 1300 (!) 146/55 97.9 F (36.6 C) Oral 72 16 96 %     Recent laboratory studies:  Recent Labs  04/24/16 0315 04/24/16 0521 04/25/16 0540  WBC 8.9  --  10.0  HGB 10.7*  --  10.3*  HCT 34.1*  --  33.0*  PLT 225  --  162  NA  --  138  --   K  --  4.1  --   CL  --  106  --   CO2  --  23  --   BUN  --  15  --   CREATININE  --  0.81  --   GLUCOSE  --  125*  --   CALCIUM  --  8.8*  --      Discharge Medications:     Medication List    STOP taking these medications   tizanidine 2 MG capsule Commonly known as:  ZANAFLEX     TAKE these medications   aspirin EC 325 MG tablet Take 1 tablet (325 mg total) by mouth 2 (two) times daily. What changed:  when to take this  reasons to take this   atenolol 100 MG tablet Commonly known as:  TENORMIN Take 100 mg by mouth daily.   COMBIGAN 0.2-0.5 % ophthalmic solution Generic drug:  brimonidine-timolol Place 1 drop into both eyes every 12 (twelve) hours.  esomeprazole 40 MG capsule Commonly known as:  NEXIUM Take 40 mg by mouth daily before breakfast.   fluticasone 50 MCG/ACT nasal spray Commonly known as:  FLONASE Place 1 spray into both nostrils daily.   lisinopril 20 MG tablet Commonly known as:  PRINIVIL,ZESTRIL Take 20 mg by mouth daily.   loteprednol 0.5 % ophthalmic suspension Commonly known as:  LOTEMAX Place 1 drop into both eyes daily.   metFORMIN 500 MG tablet Commonly known as:  GLUCOPHAGE Take 500-1,000 mg by mouth 2 (two) times daily with a meal. Take 500mg  every morning; take an additional 500 mg if sugar is >200   methocarbamol 500 MG tablet Commonly known as:  ROBAXIN Take 1 tablet (500 mg total) by mouth 2 (two) times daily with a meal.   NIFEdipine 90 MG 24 hr tablet Commonly known as:  ADALAT CC Take 90 mg by mouth daily.   oxyCODONE-acetaminophen 5-325 MG tablet Commonly known as:  ROXICET Take 1 tablet by mouth  every 4 (four) hours as needed for severe pain.       Diagnostic Studies: Dg Chest 2 View  Result Date: 04/15/2016 CLINICAL DATA:  Preop for left total knee replacement, some shortness of breath and sleep apnea EXAM: CHEST  2 VIEW COMPARISON:  Chest x-ray of 11/05/2015 FINDINGS: No active infiltrate or effusion is seen. Mediastinal and hilar contours are unremarkable. Mild cardiomegaly is stable. There are mild degenerative changes in the mid to lower thoracic spine. IMPRESSION: Stable mild cardiomegaly.  No active lung disease Electronically Signed   By: Ivar Drape M.D.   On: 04/15/2016 15:18    Disposition: 06-Home-Health Care Svc  Discharge Instructions    CPM    Complete by:  As directed    Continuous passive motion machine (CPM):      Use the CPM from 0 to 60  for 5 hours per day.      You may increase by 10 degrees per day.  You may break it up into 2 or 3 sessions per day.      Use CPM for 2 weeks or until you are told to stop.   Call MD / Call 911    Complete by:  As directed    If you experience chest pain or shortness of breath, CALL 911 and be transported to the hospital emergency room.  If you develope a fever above 101 F, pus (white drainage) or increased drainage or redness at the wound, or calf pain, call your surgeon's office.   Constipation Prevention    Complete by:  As directed    Drink plenty of fluids.  Prune juice may be helpful.  You may use a stool softener, such as Colace (over the counter) 100 mg twice a day.  Use MiraLax (over the counter) for constipation as needed.   Diet - low sodium heart healthy    Complete by:  As directed    Driving restrictions    Complete by:  As directed    No driving for 2 weeks   Increase activity slowly as tolerated    Complete by:  As directed    Patient may shower    Complete by:  As directed    You may shower without a dressing once there is no drainage.  Do not wash over the wound.  If drainage remains, cover wound with  plastic wrap and then shower.      Follow-up Information    Kerin Salen, MD Follow up in  2 week(s).   Specialty:  Orthopedic Surgery Contact information: [place] [address] [address] .   Why:  Someone from [place] [facility] [employer]) will contact you to arrange start date and time for therapy. Contact information: [address] [place] [address] [phone]            Signed: Hardin Negus, Deland Slocumb R 04/25/2016, 9:41 AM

## 2016-04-25 NOTE — Plan of Care (Signed)
Problem: Safety: Goal: Ability to remain free from injury will improve Outcome: Progressing No fall or injury noted  Problem: Pain Managment: Goal: General experience of comfort will improve Outcome: Progressing Medicated once for pain  Problem: Physical Regulation: Goal: Will remain free from infection Outcome: Progressing No signs of infection noted, VS WNL  Problem: Bowel/Gastric: Goal: Will not experience complications related to bowel motility Outcome: Progressing No bowel issues

## 2016-07-09 ENCOUNTER — Ambulatory Visit (INDEPENDENT_AMBULATORY_CARE_PROVIDER_SITE_OTHER): Payer: Medicare Other | Admitting: Neurology

## 2016-07-09 ENCOUNTER — Encounter: Payer: Self-pay | Admitting: Neurology

## 2016-07-09 VITALS — BP 141/76 | HR 68 | Resp 20 | Ht 65.0 in | Wt 172.0 lb

## 2016-07-09 DIAGNOSIS — R0683 Snoring: Secondary | ICD-10-CM

## 2016-07-09 DIAGNOSIS — R400 Somnolence: Secondary | ICD-10-CM | POA: Diagnosis not present

## 2016-07-09 DIAGNOSIS — R51 Headache: Secondary | ICD-10-CM | POA: Diagnosis not present

## 2016-07-09 DIAGNOSIS — R351 Nocturia: Secondary | ICD-10-CM | POA: Diagnosis not present

## 2016-07-09 DIAGNOSIS — R519 Headache, unspecified: Secondary | ICD-10-CM

## 2016-07-09 DIAGNOSIS — G4733 Obstructive sleep apnea (adult) (pediatric): Secondary | ICD-10-CM | POA: Diagnosis not present

## 2016-07-09 DIAGNOSIS — Z9989 Dependence on other enabling machines and devices: Secondary | ICD-10-CM | POA: Diagnosis not present

## 2016-07-09 NOTE — Progress Notes (Signed)
Subjective:    Patient ID: Casey Liu is a 81 y.o. female.  HPI     Star Age, MD, PhD [facility] [address], [address] [place], [place] [address]  Dear [name],  I saw your patient, Casey Liu, upon your kind request in my neurologic clinic today for initial consultation of her sleep disorder, in particular, reevaluation for her prior diagnosis of OSA. The patient is accompanied by [place] interpreter, Dory, today. As you know, [name] is a 81 year old right-handed woman with an underlying medical history of hypertension, pulmonary hypertension, type 2 diabetes, reflux disease, seasonal allergies, history of pneumonia, arthritis, status post left total knee arthroplasty on 04/23/2016, status post right total knee arthroplasty on 11/12/2015, who was previously diagnosed with obstructive sleep apnea and placed on CPAP therapy. Sleep study testing was over 5 years ago, prior sleep study results are not available for my review. I reviewed your office note from 06/23/2016, which you kindly included.  The CPAP download is not available for my review today. Her machine is about 79 years old she estimates. She has been using a nasal mask which breaks easily. Prior to that she was prescribed a full face mask which she preferred. With CPAP she has been sleeping better, without CPAP she has a history of loud snoring. She has been waking up with headaches frequently. These are not severe or debilitating, dull and achy typically. She denies any restless leg symptoms but has residual knee pain bilaterally, left more than right. She has a cane available and also a walker but typically uses her cane only. She reports a bedtime of around 9 PM and while she falls asleep fairly quickly, she is often awake right after midnight and has trouble going back to sleep. Sleep was severely disrupted by multiple bathroom breaks, she says that she woke up to  the bathroom up to 10 times per night. She has a bedside commode. She is typically out of bed by 7:15 AM. She occasionally naps. Epworth sleepiness score is 9 out of 24, fatigue score is 57 out of 63. She lives with one of her daughters. She has a total of 7 children. She does not smoke or drink alcohol or use caffeine on a daily basis. She has not been able to use her CPAP for at least 2 weeks. She complains of discomfort with the mask and machine is not working properly.  Her Past Medical History Is Significant For: Past Medical History:  Diagnosis Date  . Arthritis   . Cataract    right  . Diabetes mellitus without complication ([place])   . Family history of anesthesia complication    mother had "three heart attacks after anesthesia"  . Headache(784.0)    hx of  . History of stomach ulcers   . Hypertension    Dr. Jonelle Sidle [phone]  . Pneumonia    hx of   . Seasonal allergies   . Sleep apnea    cpap  . Stroke [facility])    left side weakness  . Urinary tract infection    hx of    Her Past Surgical History Is Significant For: Past Surgical History:  Procedure Laterality Date  . ABDOMINAL HYSTERECTOMY    . APPENDECTOMY    . BLADDER SURGERY    . CATARACT EXTRACTION W/PHACO  07/07/2012   Procedure: CATARACT EXTRACTION PHACO AND INTRAOCULAR LENS PLACEMENT (IOC);  Surgeon: Adonis Brook, MD;  Location: [facility];  Service: Ophthalmology;  Laterality: Right;  .  CESAREAN SECTION     x 1  . CHOLECYSTECTOMY    . EYE SURGERY     "seven eye surgery"  . KNEE SURGERY     left knee  . OVARIAN CYST REMOVAL    . TOTAL KNEE ARTHROPLASTY Right 11/12/2015   Procedure: TOTAL KNEE ARTHROPLASTY;  Surgeon: Frederik Pear, MD;  Location: [place];  Service: Orthopedics;  Laterality: Right;  . TOTAL KNEE ARTHROPLASTY Left 04/23/2016   Procedure: LEFT TOTAL KNEE ARTHROPLASTY;  Surgeon: Frederik Pear, MD;  Location: [place];  Service: Orthopedics;  Laterality: Left;  . TUBAL LIGATION      Her Family History Is  Significant For: No family history on file.  Her Social History Is Significant For: Social History   Social History  . Marital status: Single    Spouse name: N/A  . Number of children: N/A  . Years of education: N/A   Social History Main Topics  . Smoking status: Never Smoker  . Smokeless tobacco: Never Used  . Alcohol use No     Comment: social hx   . Drug use: No  . Sexual activity: Not Asked   Other Topics Concern  . None   Social History Narrative  . None    Her Allergies Are:  Allergies  Allergen Reactions  . Celebrex [Celecoxib] Nausea And Vomiting and Palpitations  . Latex Rash  :   Her Current Medications Are:  Outpatient Encounter Prescriptions as of 07/09/2016  Medication Sig  . aspirin EC 325 MG tablet Take 1 tablet (325 mg total) by mouth 2 (two) times daily.  Marland Kitchen atenolol (TENORMIN) 100 MG tablet Take 100 mg by mouth daily.  . brimonidine-timolol (COMBIGAN) 0.2-0.5 % ophthalmic solution Place 1 drop into both eyes every 12 (twelve) hours.  Marland Kitchen esomeprazole (NEXIUM) 40 MG capsule Take 40 mg by mouth daily before breakfast.  . fluticasone (FLONASE) 50 MCG/ACT nasal spray Place 1 spray into both nostrils daily.   Marland Kitchen loteprednol (LOTEMAX) 0.5 % ophthalmic suspension Place 1 drop into both eyes daily.  . metFORMIN (GLUCOPHAGE) 500 MG tablet Take 500-1,000 mg by mouth 2 (two) times daily with a meal. Take 500mg  every morning; take an additional 500 mg if sugar is >200  . methocarbamol (ROBAXIN) 500 MG tablet Take 1 tablet (500 mg total) by mouth 2 (two) times daily with a meal.  . NIFEdipine (ADALAT CC) 90 MG 24 hr tablet Take 90 mg by mouth daily.  Marland Kitchen oxyCODONE-acetaminophen (ROXICET) 5-325 MG tablet Take 1 tablet by mouth every 4 (four) hours as needed for severe pain.  . valsartan-hydrochlorothiazide (DIOVAN-HCT) 320-12.5 MG tablet Take 1 tablet by mouth daily.   No facility-administered encounter medications on file as of 07/09/2016.   :  Review of Systems:   Out of a complete 14 point review of systems, all are reviewed and negative with the exception of these symptoms as listed below: Review of Systems  Respiratory: Positive for shortness of breath.   Cardiovascular: Positive for chest pain.  Neurological: Positive for headaches.       Pt presents today to discuss her sleep. Pt has a cpap but has not used it recently because it is broken. Her cpap is unable to be downloaded in the office. Pt does not have a DME.  Epworth Sleepiness Scale 0= would never doze 1= slight chance of dozing 2= moderate chance of dozing 3= high chance of dozing  Sitting and reading: 3 Watching TV: 1 Sitting inactive in a public place (  ex. Theater or meeting): 0 As a passenger in a car for an hour without a break: 0 Lying down to rest in the afternoon: 2 Sitting and talking to someone: 0 Sitting quietly after lunch (no alcohol): 3 In a car, while stopped in traffic: 0 Total: 9   Psychiatric/Behavioral: Positive for sleep disturbance.    Objective:  Neurologic Exam  Physical Exam Physical Examination:   Vitals:   07/09/16 0902  BP: (!) 141/76  Pulse: 68  Resp: 20   General Examination: The patient is a very pleasant 81 y.o. female in no acute distress. She appears well-developed and well-nourished and well groomed.   HEENT: Normocephalic, atraumatic, pupils are equal, round and reactive to light and accommodation. She has corrective eye glasses. S/p cataract repairs. Funduscopic exam is normal with sharp disc margins noted.She reports decreased vision on the right side, has had multiple surgeries on the right eye. Extraocular tracking is good without limitation to gaze excursion or nystagmus noted. Normal smooth pursuit is noted. Hearing is mildly impaired, forgot her R hearing aid. Face is symmetric with normal facial animation and normal facial sensation. Speech is clear with no dysarthria noted. There is no hypophonia. There is no lip, neck/head, jaw  or voice tremor. Neck is supple with full range of passive and active motion. There are no carotid bruits on auscultation. Oropharynx exam reveals: mild mouth dryness, marginal dental hygiene with dentures on top and multiple missing teeth on bottom, and moderate airway crowding, due to smaller airway opening, redundant soft palate. Mallampati is class II. Tongue protrudes centrally and palate elevates symmetrically. Tonsils are small, but poorly visualized. Neck size is 13 7/8 inches. She has a Mild overbite.   Chest: Clear to auscultation without wheezing, rhonchi or crackles noted.  Heart: S1+S2+0, regular and normal without murmurs, rubs or gallops noted.   Abdomen: Soft, non-tender and non-distended with normal bowel sounds appreciated on auscultation.  Extremities: There is 2+ pitting edema in the distal lower extremities bilaterally. Pedal pulses are intact.  Skin: Warm and dry without trophic changes noted. There are no varicose veins.  Musculoskeletal: exam reveals no obvious joint deformities, tenderness or joint swelling or erythema.   Neurologically:  Mental status: The patient is awake, alert and oriented in all 4 spheres. Her immediate and remote memory, attention, language skills and fund of knowledge are appropriate. There is no evidence of aphasia, agnosia, apraxia or anomia. Speech is clear with normal prosody and enunciation. Thought process is linear. Mood is normal and affect is normal.  Cranial nerves II - XII are as described above under HEENT exam. In addition: shoulder shrug is normal with equal shoulder height noted. Motor exam: Normal bulk, strength and tone is noted. There is no drift, tremor or rebound. Romberg is negative. Reflexes are 2+ throughout.  Fine motor skills and coordination: intact with normal finger taps, normal hand movements, normal rapid alternating patting, normal foot taps and normal foot agility.  Cerebellar testing: No dysmetria or intention tremor  on finger to nose testing. Heel to shin is not possible for her.  Sensory exam: intact to light touch, pinprick, vibration, temperature sense in the upper and lower extremities with mild decrease to sensation noted in the distal lower extremities.  Gait, station and balance: She stands with difficulty and complains of left knee pain, she walks slowly and cautiously, did not bring a cane or walker today, has a mild limp on the left, tandem walk is not possible for her.  Assessment and Plan:  In summary, Havannah Formby is a very pleasant 81 y.o.-year old female  with an underlying medical history of hypertension, pulmonary hypertension, type 2 diabetes, reflux disease, seasonal allergies, history of pneumonia, arthritis, status post left total knee arthroplasty on 04/23/2016, status post right total knee arthroplasty on 11/12/2015, who was diagnosed with obstructive sleep apnea over 5 years ago, and placed on CPAP therapy. She has been compliant with CPAP per her report but has been having difficulty with her current machine. She has an older machine, also a broken nasal mask which she has fixed with tape. She needs reevaluation and repeat sleep study testing and adjusting treatment settings and proper mask fitting are justified with an in lab sleep study.  I had a long chat with the patient through [place] about my findings and the diagnosis of OSA, its prognosis and treatment options. We talked about medical treatments, surgical interventions and non-pharmacological approaches. I explained in particular the risks and ramifications of untreated moderate to severe OSA, especially with respect to developing cardiovascular disease down the Road, including congestive heart failure, difficult to treat hypertension, cardiac arrhythmias, or stroke. Even type 2 diabetes has, in part, been linked to untreated OSA. Symptoms of untreated OSA include daytime sleepiness, memory problems, mood irritability and mood  disorder such as depression and anxiety, lack of energy, as well as recurrent headaches, especially morning headaches. We talked about trying to maintain a healthy lifestyle in general, as well as the importance of weight control. I encouraged the patient to eat healthy, exercise daily and keep well hydrated, to keep a scheduled bedtime and wake time routine, to not skip any meals and eat healthy snacks in between meals. I advised the patient not to drive when feeling sleepy. I recommended the following at this time: sleep study with potential positive airway pressure titration. (We will score hypopneas at 4% and split the sleep study into diagnostic and treatment portion, if the estimated. 2 hour AHI is >15/h).   I explained the sleep test procedure to the patient and also outlined possible surgical and non-surgical treatment options of OSA, including the use of a custom-made dental device (which would require a referral to a specialist dentist or oral surgeon), upper airway surgical options, such as pillar implants, radiofrequency surgery, tongue base surgery, and UPPP (which would involve a referral to an ENT surgeon). Rarely, jaw surgery such as mandibular advancement may be considered.  I also explained the CPAP treatment option to the patient, who indicated that she would be willing to continue to use CPAP if the need arises. I explained the importance of being compliant with PAP treatment, not only for insurance purposes but primarily to improve Her symptoms, and for the patient's long term health benefit, including to reduce Her cardiovascular risks. I answered all her questions today and the patient was in agreement. I would like to see her back after the sleep study is completed and encouraged her to call with any interim questions, concerns, problems or updates.   Thank you very much for allowing me to participate in the care of this nice patient. If I can be of any further assistance to you please  do not hesitate to call me at [phone].  Sincerely,   Star Age, MD, PhD

## 2016-07-09 NOTE — Patient Instructions (Signed)
Based on your symptoms and your exam I believe you still are at risk for obstructive sleep apnea or OSA, and I think we should proceed with a sleep study to determine whether you do or do not have OSA and how severe it is. If you have more than mild OSA, I want you to consider treatment with CPAP. Please remember, the risks and ramifications of moderate to severe obstructive sleep apnea or OSA are: Cardiovascular disease, including congestive heart failure, stroke, difficult to control hypertension, arrhythmias, and even type 2 diabetes has been linked to untreated OSA. Sleep apnea causes disruption of sleep and sleep deprivation in most cases, which, in turn, can cause recurrent headaches, problems with memory, mood, concentration, focus, and vigilance. Most people with untreated sleep apnea report excessive daytime sleepiness, which can affect their ability to drive. Please do not drive if you feel sleepy.   I will likely see you back after your sleep study to go over the test results and where to go from there. We will call you after your sleep study to advise about the results (most likely, you will hear from Beverlee Nims, my nurse) and to set up an appointment at the time, as necessary.    Our sleep lab administrative assistant, Arrie Aran will meet with you or call you to schedule your sleep study. If you don't hear back from her by next week please feel free to call her at [phone]. This is her direct line and please leave a message with your phone number to call back if you get the voicemail box. She will call back as soon as possible.

## 2016-07-22 DIAGNOSIS — I10 Essential (primary) hypertension: Secondary | ICD-10-CM | POA: Diagnosis not present

## 2016-07-22 DIAGNOSIS — R0989 Other specified symptoms and signs involving the circulatory and respiratory systems: Secondary | ICD-10-CM | POA: Diagnosis not present

## 2016-07-22 DIAGNOSIS — R0609 Other forms of dyspnea: Secondary | ICD-10-CM | POA: Diagnosis not present

## 2016-07-22 DIAGNOSIS — G4733 Obstructive sleep apnea (adult) (pediatric): Secondary | ICD-10-CM | POA: Diagnosis not present

## 2016-07-23 DIAGNOSIS — E1165 Type 2 diabetes mellitus with hyperglycemia: Secondary | ICD-10-CM | POA: Diagnosis not present

## 2016-07-23 DIAGNOSIS — I10 Essential (primary) hypertension: Secondary | ICD-10-CM | POA: Diagnosis not present

## 2016-07-24 ENCOUNTER — Ambulatory Visit (INDEPENDENT_AMBULATORY_CARE_PROVIDER_SITE_OTHER): Payer: Medicare Other | Admitting: Neurology

## 2016-07-24 VITALS — Ht 65.0 in | Wt 172.0 lb

## 2016-07-24 DIAGNOSIS — G4733 Obstructive sleep apnea (adult) (pediatric): Secondary | ICD-10-CM | POA: Diagnosis not present

## 2016-07-24 DIAGNOSIS — G4720 Circadian rhythm sleep disorder, unspecified type: Secondary | ICD-10-CM

## 2016-07-31 NOTE — Addendum Note (Signed)
Addended by: Star Age on: 07/31/2016 08:12 PM   Modules accepted: Orders

## 2016-07-31 NOTE — Progress Notes (Signed)
Language barrier, please try to reach English speaking relative:  Patient referred by Dr. Einar Gip, seen by me on 07/09/16, diagnostic PSG on 07/24/16.    Please call and notify the patient that the recent sleep study did confirm the diagnosis of moderate to severe obstructive sleep apnea and that I recommend treatment for this in the form of CPAP. This will require a repeat sleep study for proper titration and mask fitting. Please explain to patient and arrange for a CPAP titration study. I have placed an order in the chart. Thanks, and please route to [facility] for scheduling next sleep study.  Star Age, MD, PhD [facility])

## 2016-07-31 NOTE — Procedures (Signed)
PATIENT'S NAME:  Casey Liu, Casey Liu DOB:      Jun 17, 1935      MR#:    [id number]     DATE OF RECORDING: 07/24/2016 REFERRING M.D.:  Eldred Manges Study Performed:   Baseline Polysomnogram HISTORY: 82 year old woman with a history of hypertension, pulmonary hypertension, type 2 diabetes, reflux disease, seasonal allergies, history of pneumonia, arthritis, status post left total knee arthroplasty on 04/23/2016, status post right total knee arthroplasty on 11/12/2015, who was previously diagnosed with obstructive sleep apnea and placed on CPAP therapy, she has defective CPAP equipment and prior study results from years ago are not available. The patient endorsed the Epworth Sleepiness Scale at 9/24 points.  The patient's weight 172 pounds with a height of 65 (inches), resulting in a BMI of 28.7 kg/m2. The patient's neck circumference measured 14 inches.  CURRENT MEDICATIONS: Aspirin, Tenormin, Combigan, Nexium Flonaise, Lotemax, Glucophage, Robaxin, Adalat, Oxycodone, Diovan-HCT   PROCEDURE:  This is a multichannel digital polysomnogram utilizing the Somnostar 11.2 system.  Electrodes and sensors were applied and monitored per AASM Specifications.   EEG, EOG, Chin and Limb EMG, were sampled at 200 Hz.  ECG, Snore and Nasal Pressure, Thermal Airflow, Respiratory Effort, CPAP Flow and Pressure, Oximetry was sampled at 50 Hz. Digital video and audio were recorded.      BASELINE STUDY  Lights Out was at 21:49 and Lights On at 04:55.  Total recording time (TRT) was 425.5 minutes, with a total sleep time (TST) of  332.5 minutes.   The patient's sleep latency was 23 minutes.  REM latency was 261.5 minutes, which is markedly increased.  The sleep efficiency was 78.1 %.     SLEEP ARCHITECTURE: WASO (Wake after sleep onset) was 47 minutes with mild to moderate sleep fragmentation noted.  There were 10.5 minutes in Stage N1, 287.5 minutes Stage N2, 0 minutes Stage N3 and 34.5 minutes in Stage REM.  The  percentage of Stage N1 was 3.2%, Stage N2 was 86.5%, which is markedly increased, Stage N3 was absent and Stage R (REM sleep) was 10.4%, which is reduced.   The arousals were noted as: 12 were spontaneous, 0 were associated with PLMs, 7 were associated with respiratory events.    Audio and video analysis did not show any abnormal or unusual movements, behaviors, phonations or vocalizations.  The patient took 1 bathroom break. Mild to moderate and at times loud snoring was noted. The EKG was in keeping with normal sinus rhythm (NSR).  RESPIRATORY ANALYSIS:  There were a total of 156 respiratory events:  54 obstructive apneas, 0 central apneas and 4 mixed apneas with a total of 58 apneas and an apnea index (AI) of 10.5 /hour. There were 98 hypopneas with a hypopnea index of 17.7 /hour. The patient also had 35 respiratory event related arousals (RERAs).      The total APNEA/HYPOPNEA INDEX (AHI) was 28.2/hour and the total RESPIRATORY DISTURBANCE INDEX was 34.5 /hour.  39 events occurred in REM sleep and 188 events in NREM. The REM AHI was 67.8 /hour, versus a non-REM AHI of 23.6. The patient spent 50.5 minutes of total sleep time in the supine position and 282 minutes in non-supine.. The supine AHI was 30.9 versus a non-supine AHI of 27.7.  OXYGEN SATURATION & C02:  The Wake baseline 02 saturation was 96%, with the lowest being 73%. Time spent below 89% saturation equaled 140 minutes.  PERIODIC LIMB MOVEMENTS:   The patient had a total of 18 Periodic Limb Movements.  The  Periodic Limb Movement (PLM) index was 3.2 and the PLM Arousal index was 0/hour.    Post-study, the patient indicated that sleep was better than usual.   IMPRESSION: 1. Obstructive Sleep Apnea (OSA) 2. Dysfunctions associated with sleep stages or arousal from sleep  RECOMMENDATIONS:  1. This overnight polysomnogram demonstrates moderate to severe obstructive sleep apnea and treatment with positive airway pressure (in the form of  CPAP) is recommended; this will require a full night CPAP titration study for proper treatment settings and mask fitting.  2. Please note that untreated obstructive sleep apnea carries additional perioperative morbidity. Patients with significant obstructive sleep apnea should receive perioperative PAP therapy and the surgeons and particularly the anesthesiologist should be informed of the diagnosis and the severity of the sleep disordered breathing. 3. The patient should be cautioned not to drive, work at heights, or operate dangerous or heavy equipment when tired or sleepy. Review and reiteration of good sleep hygiene measures should be pursued with any patient. 4. This study shows sleep fragmentation and abnormal sleep stage percentages; these are nonspecific findings and per se do not signify an intrinsic sleep disorder or a cause for the patient's sleep-related symptoms. Causes include (but are not limited to) the first night effect of the sleep study, circadian rhythm disturbances, medication effect or an underlying mood disorder or medical problem.  5. The patient will be seen in follow-up by Dr. Rexene Alberts at [facility] for discussion of the test results and further management strategies. The referring provider will be notified of the test results.   I certify that I have reviewed the entire raw data recording prior to the issuance of this report in accordance with the Standards of Accreditation of the American Academy of Sleep Medicine (AASM)    Star Age, MD, PhD Diplomat, American Board of Psychiatry and Neurology (Neurology and Sleep Medicine)

## 2016-08-05 ENCOUNTER — Telehealth: Payer: Self-pay

## 2016-08-05 DIAGNOSIS — H4041X2 Glaucoma secondary to eye inflammation, right eye, moderate stage: Secondary | ICD-10-CM | POA: Diagnosis not present

## 2016-08-05 DIAGNOSIS — H401134 Primary open-angle glaucoma, bilateral, indeterminate stage: Secondary | ICD-10-CM | POA: Diagnosis not present

## 2016-08-05 DIAGNOSIS — H35371 Puckering of macula, right eye: Secondary | ICD-10-CM | POA: Diagnosis not present

## 2016-08-05 DIAGNOSIS — H04123 Dry eye syndrome of bilateral lacrimal glands: Secondary | ICD-10-CM | POA: Diagnosis not present

## 2016-08-05 DIAGNOSIS — Z961 Presence of intraocular lens: Secondary | ICD-10-CM | POA: Diagnosis not present

## 2016-08-05 DIAGNOSIS — H2512 Age-related nuclear cataract, left eye: Secondary | ICD-10-CM | POA: Diagnosis not present

## 2016-08-05 DIAGNOSIS — H20021 Recurrent acute iridocyclitis, right eye: Secondary | ICD-10-CM | POA: Diagnosis not present

## 2016-08-05 NOTE — Telephone Encounter (Signed)
-----   Message from Star Age, MD sent at 07/31/2016  8:12 PM EST ----- Language barrier, please try to reach English speaking relative:  Patient referred by Dr. Einar Gip, seen by me on 07/09/16, diagnostic PSG on 07/24/16.    Please call and notify the patient that the recent sleep study did confirm the diagnosis of moderate to severe obstructive sleep apnea and that I recommend treatment for this in the form of CPAP. This will require a repeat sleep study for proper titration and mask fitting. Please explain to patient and arrange for a CPAP titration study. I have placed an order in the chart. Thanks, and please route to [facility] for scheduling next sleep study.  Star Age, MD, PhD [facility])

## 2016-08-05 NOTE — Telephone Encounter (Signed)
I LM for patient to call back for sleep study results.

## 2016-08-05 NOTE — Telephone Encounter (Signed)
The daughter called back (speaks [place]). She is aware of results. Patient has been on CPAP in the past. They are willing to proceed with titration study and will wait for call from [facility].

## 2016-08-14 ENCOUNTER — Ambulatory Visit (INDEPENDENT_AMBULATORY_CARE_PROVIDER_SITE_OTHER): Payer: Medicare Other | Admitting: Neurology

## 2016-08-14 DIAGNOSIS — G4720 Circadian rhythm sleep disorder, unspecified type: Secondary | ICD-10-CM

## 2016-08-14 DIAGNOSIS — G4733 Obstructive sleep apnea (adult) (pediatric): Secondary | ICD-10-CM

## 2016-08-15 NOTE — Progress Notes (Signed)
Language barrier, please try to reach English speaking relative:  Patient referred by Dr. Einar Gip, seen by me on 07/09/16, diagnostic PSG on 07/24/16, CPAP study on 08/14/16:     Please call and inform patient that I have entered an order for treatment with positive airway pressure (PAP) treatment of obstructive sleep apnea (OSA). She did okay, but had some trouble tolerating the pressure, did not mind the mask, as I understand. I would like for her to start CPAP at home. We will, therefore, arrange for a machine for home use through a DME (durable medical equipment) company of Her choice; and I will see the patient back in follow-up in about 8-10 weeks. Please also explain to the patient that I will be looking out for compliance data, which can be downloaded from the machine (stored on an SD card, that is inserted in the machine) or via remote access through a modem, that is built into the machine. At the time of the followup appointment we will discuss sleep study results and how it is going with PAP treatment at home. Please advise patient to bring Her machine at the time of the first FU visit, even though this is cumbersome. Bringing the machine for every visit after that will likely not be needed, but often helps for the first visit to troubleshoot if needed. Please re-enforce the importance of compliance with treatment and the need for [place] to monitor compliance data - often an insurance requirement and actually good feedback for the patient as far as how they are doing.  Also remind patient, that any interim PAP machine or mask issues should be first addressed with the DME company, as they can often help better with technical and mask fit issues. Please ask if patient has a preference regarding DME company.  Please also make sure, the patient has a follow-up appointment with me in about 8-10 weeks from the setup date, thanks.  Once you have spoken to the patient - and faxed/routed report to PCP and referring  MD (if other than PCP), you can close this encounter, thanks,   Star Age, MD, PhD [facility] ([place])

## 2016-08-15 NOTE — Procedures (Signed)
PATIENT'S NAME:  Casey Liu, Parfait DOB:      08/17/1934      MR#:    [id number]     DATE OF RECORDING: 08/14/2016 REFERRING M.D.:  Eldred Manges Study Performed:   CPAP  Titration HISTORY: 82 year old woman with a history of hypertension, pulmonary hypertension, type 2 diabetes, reflux disease, seasonal allergies, history of pneumonia, arthritis, status post left total knee arthroplasty on 04/23/2016, status post right total knee arthroplasty on 11/12/2015, who had a baseline sleep study on 07/24/16 showing an AHI of 28.2/hour and lowest desaturation to 73%. The patient's BMI is 28.7 kg/m2.  CURRENT MEDICATIONS: Aspirin, Tenormin, Combigan, Nexium Flonaise, Lotemax, Glucophage, Robaxin, Adalat, Oxycodone, Diovan-HCT   PROCEDURE:  This is a multichannel digital polysomnogram utilizing the SomnoStar 11.2 system.  Electrodes and sensors were applied and monitored per AASM Specifications.   EEG, EOG, Chin and Limb EMG, were sampled at 200 Hz.  ECG, Snore and Nasal Pressure, Thermal Airflow, Respiratory Effort, CPAP Flow and Pressure, Oximetry was sampled at 50 Hz. Digital video and audio were recorded.      CPAP was initiated at 5 cmH20 with heated humidity per AASM standards and pressure was advanced to 7 cmH20 because of hypopneas, apneas and desaturations and snoring. The patient had difficulty tolerating the pressure at 7 cm and EPR was utilized, sleep was poorly consolidated and patient complained of pain. At a PAP pressure of 7 cmH20, there was a reduction of the AHI to 17.2/h, O2 nadir of 87%, and minimal supine REM sleep achieved.   Lights Out was at 20:43 and Lights On at 05:02. Total recording time (TRT) was 496.5 minutes, with a total sleep time (TST) of 293 minutes. The patient's sleep latency was 9.5 minutes. REM latency was 319.5 minutes, which is markedly prolonged.  The sleep efficiency was 59.%, which is markedly reduced.    SLEEP ARCHITECTURE: WASO (Wake after sleep onset)  was 191  minutes with moderate sleep fragmentation noted and several longer periods of wakefulness.  There were 17.5 minutes in Stage N1, 185.5 minutes Stage N2, 53.5 minutes Stage N3 and 36.5 minutes in Stage REM.  The percentage of Stage N1 was 6.%, Stage N2 was 63.3%, which is increased, Stage N3 was 18.3%, which is normal and Stage R (REM sleep) was 12.5%, which is reduced.   The arousals were noted as: 28 were spontaneous, 0 were associated with PLMs, 18 were associated with respiratory events.  Audio and video analysis did not show any abnormal or unusual movements, behaviors, phonations or vocalizations.  The patient took 4 bathroom breaks. The EKG was in keeping with normal sinus rhythm (NSR).  RESPIRATORY ANALYSIS:  There was a total of 59 respiratory events: 11 obstructive apneas, 11 central apneas and 0 mixed apneas with a total of 22 apneas and an apnea index (AI) of 4.5 /hour. There were 37 hypopneas with a hypopnea index of 7.6/hour. The patient also had 1 respiratory event related arousals (RERAs).      The total APNEA/HYPOPNEA INDEX  (AHI) was 12.1 /hour and the total RESPIRATORY DISTURBANCE INDEX was 12.3 .hour  7 events occurred in REM sleep and 52 events in NREM. The REM AHI was 11.5 /hour versus a non-REM AHI of 12.2 /hour.  The patient spent 293 minutes of total sleep time in the supine position and 0 minutes in non-supine. The supine AHI was 12.1, versus a non-supine AHI of 0.0.  OXYGEN SATURATION & C02:  The baseline 02 saturation was 90%, with the  lowest being 85%. Time spent below 89% saturation equaled 92 minutes.  PERIODIC LIMB MOVEMENTS: The patient had a total of 0 Periodic Limb Movements. The Periodic Limb Movement (PLM) index was 0 and the PLM Arousal index was 0 /hour. Post-study, the patient indicated that sleep was the same as usual, but that she liked the CPAP mask used better than her old one. The Technologist noted the patient was fitted with a Resmed Airfit P10 with small  pillows.  DIAGNOSIS 1. Obstructive Sleep Apnea  2. Dysfunctions associated with Sleep stages or arousal from sleep  PLANS/RECOMMENDATIONS: 1. This study demonstrates improvement but no resolution of the patient's obstructive sleep apnea with CPAP therapy. She had difficulty tolerating the pressure and sleep was poorly consolidated, she had significant nocturia. I will ask the patient to start home CPAP treatment at a pressure of 8 cm via small nasal pillows with heated humidity. The patient should be reminded to be fully compliant with PAP therapy to improve sleep related symptoms and decrease long term cardiovascular risks. The patient should be reminded, that it may take up to 3 months to get fully used to using PAP with all planned sleep. The earlier full compliance is achieved, the better long term compliance tends to be. Please note that untreated obstructive sleep apnea carries additional perioperative morbidity. Patients with significant obstructive sleep apnea should receive perioperative PAP therapy and the surgeons and particularly the anesthesiologist should be informed of the diagnosis and the severity of the sleep disordered breathing. 2. The patient should be cautioned not to drive, work at heights, or operate dangerous or heavy equipment when tired or sleepy. Review and reiteration of good sleep hygiene measures should be pursued with any patient. 3. This study shows sleep fragmentation and abnormal sleep stage percentages; these are nonspecific findings and per se do not signify an intrinsic sleep disorder or a cause for the patient's sleep-related symptoms. Causes include (but are not limited to) the first night effect of the sleep study, circadian rhythm disturbances, medication effect or an underlying mood disorder or medical problem.  4. The patient will be seen in follow-up by Dr. Rexene Alberts at [facility] for discussion of the test results and further management strategies. The referring provider  will be notified of the test results.  I certify that I have reviewed the entire raw data recording prior to the issuance of this report in accordance with the Standards of Accreditation of the American Academy of Sleep Medicine (AASM)  Star Age, MD, PhD  Diplomat, American Board of Psychiatry and Neurology (Neurology and Sleep Medicine)

## 2016-08-15 NOTE — Addendum Note (Signed)
Addended by: Star Age on: 08/15/2016 12:34 PM   Modules accepted: Orders

## 2016-08-18 ENCOUNTER — Telehealth: Payer: Self-pay

## 2016-08-18 NOTE — Telephone Encounter (Signed)
LM for patient or daughter to call back for sleep study results.

## 2016-08-18 NOTE — Telephone Encounter (Signed)
Daughter called back and was given the results and recommendations. Daughter states that patient it agreeable with starting treatment. I will send order to [facility]. PCP will get a copy of the report. Patient will get a letter reminding her to make f/u appt and stress the importance of compliance.

## 2016-08-18 NOTE — Telephone Encounter (Signed)
-----   Message from Star Age, MD sent at 08/15/2016 12:34 PM EST ----- Language barrier, please try to reach English speaking relative:  Patient referred by Dr. Einar Gip, seen by me on 07/09/16, diagnostic PSG on 07/24/16, CPAP study on 08/14/16:     Please call and inform patient that I have entered an order for treatment with positive airway pressure (PAP) treatment of obstructive sleep apnea (OSA). She did okay, but had some trouble tolerating the pressure, did not mind the mask, as I understand. I would like for her to start CPAP at home. We will, therefore, arrange for a machine for home use through a DME (durable medical equipment) company of Her choice; and I will see the patient back in follow-up in about 8-10 weeks. Please also explain to the patient that I will be looking out for compliance data, which can be downloaded from the machine (stored on an SD card, that is inserted in the machine) or via remote access through a modem, that is built into the machine. At the time of the followup appointment we will discuss sleep study results and how it is going with PAP treatment at home. Please advise patient to bring Her machine at the time of the first FU visit, even though this is cumbersome. Bringing the machine for every visit after that will likely not be needed, but often helps for the first visit to troubleshoot if needed. Please re-enforce the importance of compliance with treatment and the need for [place] to monitor compliance data - often an insurance requirement and actually good feedback for the patient as far as how they are doing.  Also remind patient, that any interim PAP machine or mask issues should be first addressed with the DME company, as they can often help better with technical and mask fit issues. Please ask if patient has a preference regarding DME company.  Please also make sure, the patient has a follow-up appointment with me in about 8-10 weeks from the setup date, thanks.  Once you  have spoken to the patient - and faxed/routed report to PCP and referring MD (if other than PCP), you can close this encounter, thanks,   Star Age, MD, PhD [facility] ([place])

## 2016-09-03 DIAGNOSIS — G4733 Obstructive sleep apnea (adult) (pediatric): Secondary | ICD-10-CM | POA: Diagnosis not present

## 2016-09-03 DIAGNOSIS — R0989 Other specified symptoms and signs involving the circulatory and respiratory systems: Secondary | ICD-10-CM | POA: Diagnosis not present

## 2016-09-03 DIAGNOSIS — R0609 Other forms of dyspnea: Secondary | ICD-10-CM | POA: Diagnosis not present

## 2016-09-03 DIAGNOSIS — I10 Essential (primary) hypertension: Secondary | ICD-10-CM | POA: Diagnosis not present

## 2016-09-08 DIAGNOSIS — I10 Essential (primary) hypertension: Secondary | ICD-10-CM | POA: Diagnosis not present

## 2016-09-09 DIAGNOSIS — E119 Type 2 diabetes mellitus without complications: Secondary | ICD-10-CM | POA: Diagnosis not present

## 2016-09-09 DIAGNOSIS — I10 Essential (primary) hypertension: Secondary | ICD-10-CM | POA: Diagnosis not present

## 2016-09-09 DIAGNOSIS — M6281 Muscle weakness (generalized): Secondary | ICD-10-CM | POA: Diagnosis not present

## 2016-09-09 DIAGNOSIS — M1990 Unspecified osteoarthritis, unspecified site: Secondary | ICD-10-CM | POA: Diagnosis not present

## 2016-09-12 DIAGNOSIS — M25521 Pain in right elbow: Secondary | ICD-10-CM | POA: Diagnosis not present

## 2016-09-12 DIAGNOSIS — M25561 Pain in right knee: Secondary | ICD-10-CM | POA: Diagnosis not present

## 2016-09-12 DIAGNOSIS — M25531 Pain in right wrist: Secondary | ICD-10-CM | POA: Diagnosis not present

## 2016-09-12 DIAGNOSIS — Z96651 Presence of right artificial knee joint: Secondary | ICD-10-CM | POA: Diagnosis not present

## 2016-09-12 DIAGNOSIS — Z96652 Presence of left artificial knee joint: Secondary | ICD-10-CM | POA: Diagnosis not present

## 2016-09-12 DIAGNOSIS — M25562 Pain in left knee: Secondary | ICD-10-CM | POA: Diagnosis not present

## 2016-10-24 ENCOUNTER — Telehealth: Payer: Self-pay

## 2016-10-24 NOTE — Telephone Encounter (Signed)
I called pt, spoke to pt's daughter, Destiny Hagin, per [facility]. I advised her that the pt needs a follow up visit for her cpap for insurance purposes. A follow up appt was made for 11/12/2016 at 1:00pm. Pt's daughter verbalized understanding of appt date and time.

## 2016-10-27 NOTE — Telephone Encounter (Signed)
I called pt's daughter Casey Liu, per DPR. Pt is in [place] and will need an appt after 11/26/2016. I will keep looking for an opening. Pt's daughter verbalized understanding.

## 2016-10-27 NOTE — Telephone Encounter (Signed)
Pt daughter called to request that if anything re: appointments open up for May that she be given a call, I put pt on wait list

## 2016-11-12 ENCOUNTER — Ambulatory Visit: Payer: Self-pay | Admitting: Neurology

## 2016-11-18 NOTE — Telephone Encounter (Signed)
LM that there was an opening May 24 at 11:30. I advised that I will go ahead and move appt up because the appt will be taken soon. So I cancelled her appt in June and moved it up to may 24th. Left call back number if that appt does not work for them.

## 2016-12-01 DIAGNOSIS — H2512 Age-related nuclear cataract, left eye: Secondary | ICD-10-CM | POA: Diagnosis not present

## 2016-12-01 DIAGNOSIS — Z961 Presence of intraocular lens: Secondary | ICD-10-CM | POA: Diagnosis not present

## 2016-12-01 DIAGNOSIS — H35371 Puckering of macula, right eye: Secondary | ICD-10-CM | POA: Diagnosis not present

## 2016-12-01 DIAGNOSIS — H401112 Primary open-angle glaucoma, right eye, moderate stage: Secondary | ICD-10-CM | POA: Diagnosis not present

## 2016-12-01 DIAGNOSIS — H04123 Dry eye syndrome of bilateral lacrimal glands: Secondary | ICD-10-CM | POA: Diagnosis not present

## 2016-12-01 DIAGNOSIS — H4041X2 Glaucoma secondary to eye inflammation, right eye, moderate stage: Secondary | ICD-10-CM | POA: Diagnosis not present

## 2016-12-01 DIAGNOSIS — H20021 Recurrent acute iridocyclitis, right eye: Secondary | ICD-10-CM | POA: Diagnosis not present

## 2016-12-11 ENCOUNTER — Ambulatory Visit (INDEPENDENT_AMBULATORY_CARE_PROVIDER_SITE_OTHER): Payer: Medicare Other | Admitting: Neurology

## 2016-12-11 ENCOUNTER — Encounter (INDEPENDENT_AMBULATORY_CARE_PROVIDER_SITE_OTHER): Payer: Self-pay

## 2016-12-11 ENCOUNTER — Encounter: Payer: Self-pay | Admitting: Neurology

## 2016-12-11 VITALS — BP 118/64 | HR 62 | Resp 16 | Ht 65.0 in | Wt 168.0 lb

## 2016-12-11 DIAGNOSIS — Z9989 Dependence on other enabling machines and devices: Secondary | ICD-10-CM | POA: Diagnosis not present

## 2016-12-11 DIAGNOSIS — G4733 Obstructive sleep apnea (adult) (pediatric): Secondary | ICD-10-CM | POA: Diagnosis not present

## 2016-12-11 NOTE — Patient Instructions (Addendum)
We will continue with your cpap at the current setting.  Please continue using your CPAP regularly. While your insurance requires that you use CPAP at least 4 hours each night on 70% of the nights, I recommend, that you not skip any nights and use it throughout the night if you can. Getting used to CPAP and staying with the treatment long term does take time and patience and discipline. Untreated obstructive sleep apnea when it is moderate to severe can have an adverse impact on cardiovascular health and raise her risk for heart disease, arrhythmias, hypertension, congestive heart failure, stroke and diabetes. Untreated obstructive sleep apnea causes sleep disruption, nonrestorative sleep, and sleep deprivation. This can have an impact on your day to day functioning and cause daytime sleepiness and impairment of cognitive function, memory loss, mood disturbance, and problems focussing. Using CPAP regularly can improve these symptoms.  Keep up the good work! We will see you back in 6 months for sleep apnea check up, and if you continue to do well on CPAP I will see you once a year thereafter.  I will prescribe a chinstrap to see if airleak improves.

## 2016-12-11 NOTE — Progress Notes (Signed)
Subjective:    Patient ID: Casey Liu is a 82 y.o. female.  HPI     Interim history:   Casey Liu is a 82 year old right-handed woman with an underlying medical history of hypertension, pulmonary hypertension, type 2 diabetes, reflux disease, seasonal allergies, history of pneumonia, arthritis, status post left total knee arthroplasty on 04/23/2016, status post right total knee arthroplasty on 11/12/2015, who presents for follow-up consultation of her obstructive sleep apnea, after recent sleep study testing. The patient is accompanied by her GD and a Spanish interpreter today. I first met her on 07/09/2016 at the request of her cardiologist, at which time she reported a prior diagnosis of obstructive sleep apnea. She had an older CPAP machine. She had some morning headaches. I suggested we proceed with sleep study testing. She had a baseline sleep study, followed by a CPAP titration study. I went over her test results with her in detail today. Her baseline sleep study from 07/24/2016 showed a sleep efficiency of 78.1%, sleep latency 23 minutes, REM latency markedly delayed at 261.5 minutes. She had a decreased percentage of REM sleep, slow-wave sleep was absent, marked increase in stage II sleep. Total AHI was 28.2 per hour, REM AHI 67.8 per hour, supine AHI 30.9 per hour. Average oxygen saturation was 96%, nadir was 73%. She had no significant PLMS. Based on her medical history, her sleep-related complaints and her sleep study results I invited her for a full night CPAP titration study. She had this on 08/14/2016. Sleep efficiency was 59%, sleep latency 9.5 minutes, REM latency markedly prolonged at 319.5 minutes. She had a normal amount of slow-wave sleep, REM sleep was 12.5%. CPAP was titrated from 5 cm to 7 cm. Since she did not have complete elimination of her sleep disordered breathing I suggested a CPAP pressure of 8 cm at home. She did not have any significant PLMS, baseline  oxygen saturation was 90%, nadir was 85%.  Today, 12/11/2016 (all dictated new, as well as above notes, some dictation done in note pad or Word, outside of chart, may appear as copied):  I reviewed her CPAP compliance data from 11/10/2016 through 12/09/2016, which is a total of 30 days, during which time she used her machine 29 days with percent used days greater than 4 hours at 90%, indicating excellent compliance with an average usage of 6 hours and 35 minutes, residual AHI is slightly suboptimal at 7.2 per hour, leak on the high side with the 95th percentile at 32.6 L/m on a pressure of 8 cm with EPR of 3. She reports feeling better. Sleep is more consolidated and better quality, nocturia is improved. She feels better during the day as well has adapted well to nasal pillows and tolerates them well. She does not notice and leak around the nasal pillows but may open her mouth at night. She would like to avoid changing the nasal pillows to mask. She would be willing to try a chinstrap.  The patient's allergies, current medications, family history, past medical history, past social history, past surgical history and problem list were reviewed and updated as appropriate.   Previously (copied from previous notes for reference):   07/09/2016: She was previously diagnosed with obstructive sleep apnea and placed on CPAP therapy. Sleep study testing was over 5 years ago, prior sleep study results are not available for my review. I reviewed your office note from 06/23/2016, which you kindly included.  The CPAP download is not available for my review today. Her machine is  about 80 years old she estimates. She has been using a nasal mask which breaks easily. Prior to that she was prescribed a full face mask which she preferred. With CPAP she has been sleeping better, without CPAP she has a history of loud snoring. She has been waking up with headaches frequently. These are not severe or debilitating, dull and achy  typically. She denies any restless leg symptoms but has residual knee pain bilaterally, left more than right. She has a cane available and also a walker but typically uses her cane only. She reports a bedtime of around 9 PM and while she falls asleep fairly quickly, she is often awake right after midnight and has trouble going back to sleep. Sleep was severely disrupted by multiple bathroom breaks, she says that she woke up to the bathroom up to 10 times per night. She has a bedside commode. She is typically out of bed by 7:15 AM. She occasionally naps. Epworth sleepiness score is 9 out of 24, fatigue score is 57 out of 63. She lives with one of her daughters. She has a total of 7 children. She does not smoke or drink alcohol or use caffeine on a daily basis. She has not been able to use her CPAP for at least 2 weeks. She complains of discomfort with the mask and machine is not working properly.  Her Past Medical History Is Significant For: Past Medical History:  Diagnosis Date  . Arthritis   . Cataract    right  . Diabetes mellitus without complication ([place])   . Family history of anesthesia complication    mother had "three heart attacks after anesthesia"  . Headache(784.0)    hx of  . History of stomach ulcers   . Hypertension    Dr. Jonelle Sidle [phone]  . Pneumonia    hx of   . Seasonal allergies   . Sleep apnea    cpap  . Stroke [facility])    left side weakness  . Urinary tract infection    hx of    Her Past Surgical History Is Significant For: Past Surgical History:  Procedure Laterality Date  . ABDOMINAL HYSTERECTOMY    . APPENDECTOMY    . BLADDER SURGERY    . CATARACT EXTRACTION W/PHACO  07/07/2012   Procedure: CATARACT EXTRACTION PHACO AND INTRAOCULAR LENS PLACEMENT (IOC);  Surgeon: Adonis Brook, MD;  Location: [place];  Service: Ophthalmology;  Laterality: Right;  . CESAREAN SECTION     x 1  . CHOLECYSTECTOMY    . EYE SURGERY     "seven eye surgery"  . KNEE SURGERY     left  knee  . OVARIAN CYST REMOVAL    . TOTAL KNEE ARTHROPLASTY Right 11/12/2015   Procedure: TOTAL KNEE ARTHROPLASTY;  Surgeon: Frederik Pear, MD;  Location: [place];  Service: Orthopedics;  Laterality: Right;  . TOTAL KNEE ARTHROPLASTY Left 04/23/2016   Procedure: LEFT TOTAL KNEE ARTHROPLASTY;  Surgeon: Frederik Pear, MD;  Location: [place];  Service: Orthopedics;  Laterality: Left;  . TUBAL LIGATION      Her Family History Is Significant For: No family history on file.  Her Social History Is Significant For: Social History   Social History  . Marital status: Single    Spouse name: N/A  . Number of children: N/A  . Years of education: N/A   Social History Main Topics  . Smoking status: Never Smoker  . Smokeless tobacco: Never Used  . Alcohol use No  Comment: social hx   . Drug use: No  . Sexual activity: Not Asked   Other Topics Concern  . None   Social History Narrative  . None    Her Allergies Are:  Allergies  Allergen Reactions  . Celebrex [Celecoxib] Nausea And Vomiting and Palpitations  . Latex Rash  :   Her Current Medications Are:  Outpatient Encounter Prescriptions as of 12/11/2016  Medication Sig  . aspirin EC 325 MG tablet Take 1 tablet (325 mg total) by mouth 2 (two) times daily.  Marland Kitchen atenolol (TENORMIN) 100 MG tablet Take 100 mg by mouth daily.  . brimonidine-timolol (COMBIGAN) 0.2-0.5 % ophthalmic solution Place 1 drop into both eyes every 12 (twelve) hours.  Marland Kitchen esomeprazole (NEXIUM) 40 MG capsule Take 40 mg by mouth daily before breakfast.  . fluticasone (FLONASE) 50 MCG/ACT nasal spray Place 1 spray into both nostrils daily.   Marland Kitchen loteprednol (LOTEMAX) 0.5 % ophthalmic suspension Place 1 drop into both eyes daily.  . metFORMIN (GLUCOPHAGE) 500 MG tablet Take 500-1,000 mg by mouth 2 (two) times daily with a meal. Take 577m every morning; take an additional 500 mg if sugar is >200  . NIFEdipine (ADALAT CC) 90 MG 24 hr tablet Take 90 mg by mouth daily.  .Marland Kitchen spironolactone (ALDACTONE) 25 MG tablet Take 25 mg by mouth daily.  . valsartan-hydrochlorothiazide (DIOVAN-HCT) 320-12.5 MG tablet Take 1 tablet by mouth daily.  . [DISCONTINUED] methocarbamol (ROBAXIN) 500 MG tablet Take 1 tablet (500 mg total) by mouth 2 (two) times daily with a meal.  . [DISCONTINUED] oxyCODONE-acetaminophen (ROXICET) 5-325 MG tablet Take 1 tablet by mouth every 4 (four) hours as needed for severe pain.   No facility-administered encounter medications on file as of 12/11/2016.   : Review of Systems:  Out of a complete 14 point review of systems, all are reviewed and negative with the exception of these symptoms as listed below: Review of Systems  Neurological:       Patient states that she is doing well with CPAP. No new concerns.     Objective:  Neurologic Exam  Physical Exam  Physical Examination:   Vitals:   12/11/16 1141  BP: 118/64  Pulse: 62  Resp: 16    General Examination: The patient is a very pleasant 82y.o. female in no acute distress. She appears well-developed and well-nourished and well groomed.   HEENT: Normocephalic, atraumatic, pupils are equal, round and reactive to light and accommodation. She has corrective eye glasses. S/p cataract repairs. She reports decreased vision on the right side, has had multiple surgeries on the right eye. Extraocular tracking is good without limitation to gaze excursion or nystagmus noted. Hearing is mildly impaired. Face is symmetric with normal facial animation and normal facial sensation. Speech is clear with no dysarthria noted. There is no hypophonia. There is no lip, neck/head, jaw or voice tremor. Neck is supple with full range of passive and active motion. There are no carotid bruits on auscultation. Oropharynx exam reveals: mild mouth dryness, marginal dental hygiene with dentures on top and multiple missing teeth on bottom. Mallampati is class II. Tongue protrudes centrally and palate elevates symmetrically.  Tonsils are small.   Chest: Clear to auscultation without wheezing, rhonchi or crackles noted.  Heart: S1+S2+0, regular and normal without murmurs, rubs or gallops noted.   Abdomen: Soft, non-tender and non-distended with normal bowel sounds appreciated on auscultation.  Extremities: There is 1+ pitting edema in the distal lower extremities bilaterally.  Skin:  Warm and dry without trophic changes noted. There are no varicose veins.  Musculoskeletal: exam reveals no obvious joint deformities, tenderness or joint swelling or erythema.   Neurologically:  Mental status: The patient is awake, alert and oriented in all 4 spheres. Her immediate and remote memory, attention, language skills and fund of knowledge are appropriate. There is no evidence of aphasia, agnosia, apraxia or anomia. Speech is clear with normal prosody and enunciation. Thought process is linear. Mood is normal and affect is normal.  Cranial nerves II - XII are as described above under HEENT exam. In addition: shoulder shrug is normal with equal shoulder height noted. Motor exam: Normal bulk, strength and tone is noted. There is no drift, tremor or rebound. Romberg is negative. Reflexes are 2+ throughout.  Fine motor skills and coordination: intact with normal finger taps, normal hand movements, normal rapid alternating patting, normal foot taps and normal foot agility.  Cerebellar testing: No dysmetria or intention tremor on finger to nose testing. Heel to shin is not possible for her.  Sensory exam: intact to light touch in the upper and lower extremities with mild decrease to sensation noted in the distal lower extremities.  Gait, station and balance: She stands with difficulty and walks slowly and cautiously.  Assessment and Plan:  In summary, Malaka Ruffner is a very pleasant 82 year old female with an underlying medical history of hypertension, pulmonary hypertension, type 2 diabetes, reflux disease, seasonal  allergies, history of pneumonia, arthritis, status post left total knee arthroplasty on 04/23/2016, status post right total knee arthroplasty on 11/12/2015, who presents for follow-up consultation of her obstructive sleep apnea, after updated sleep study testing in January 2018 with a baseline sleep study, followed by a CPAP titration study. She has established CPAP therapy with her new CPAP machine. She previously had sleep study testing several years ago and had an older machine and broken supplies. She is doing well. She indicates good results with her CPAP. She feels better. She is compliant with treatment and commended for this. We talked about  Sleep study results in detail today and also reviewed her compliance data together. Physical exam is stable. She is advised to be fully compliant with treatment. I would like to see if she would to better with a chinstrap as she really likes her nasal pillows and would not like to change to at interface at this time. Hopefully if her really comes down her AHI will also improve. I suggested a six-month follow-up with one of our nurse practitioners. I prescribed a chinstrap at this time. I answered all their questions today and the patient and her granddaughter were in agreement. I spent 25 minutes in total face-to-face time with the patient, more than 50% of which was spent in counseling and coordination of care, reviewing test results, reviewing medication and discussing or reviewing the diagnosis of OSA, its prognosis and treatment options. Pertinent laboratory and imaging test results that were available during this visit with the patient were reviewed by me and considered in my medical decision making (see chart for details).

## 2016-12-15 ENCOUNTER — Ambulatory Visit (HOSPITAL_COMMUNITY)
Admission: EM | Admit: 2016-12-15 | Discharge: 2016-12-15 | Disposition: A | Discharge status: Home / Self Care | Payer: Medicare Other | Attending: Family Medicine | Admitting: Family Medicine

## 2016-12-15 ENCOUNTER — Encounter (HOSPITAL_COMMUNITY): Payer: Self-pay | Admitting: Emergency Medicine

## 2016-12-15 DIAGNOSIS — S91312A Laceration without foreign body, left foot, initial encounter: Secondary | ICD-10-CM | POA: Diagnosis not present

## 2016-12-15 NOTE — ED Triage Notes (Signed)
The patient presented to the [facility] with a complaint of a laceration to the top of her left foot that occurred today with a piece of glass.

## 2016-12-15 NOTE — ED Notes (Signed)
Christinia, rn cleansed and dressed patient's wound

## 2016-12-15 NOTE — Discharge Instructions (Signed)
We have cleaned, and bandaged your wound here in clinic. Keep it clean, dry, change the bandages at least daily, apply over-the-counter antibiotic ointment such as Neosporin, bacitracin. If there are any signs or symptoms of infection, follow up with podiatry. I have included the contact information for a podiatrist.

## 2016-12-15 NOTE — ED Provider Notes (Signed)
CSN: [id number]     Arrival date & time 12/15/16  1145 History   None    Chief Complaint  Patient presents with  . Laceration   (Consider location/radiation/quality/duration/timing/severity/associated sxs/prior Treatment) 82 year old female presents to clinic with a chief complaint of a small laceration on the dorsal surface of her left foot. States she dropped a glass, causing it to shatter, and she had a small shard cut her foot. This was cleaned at home, and bleeding was controlled prior to arrival to clinic. She does have a past history of diabetes, however it appears to be controlled, and she has no history of neuropathy or other vascular complications.   The history is provided by the patient. The history is limited by a language barrier. No language interpreter was used.    Past Medical History:  Diagnosis Date  . Arthritis   . Cataract    right  . Diabetes mellitus without complication ([place])   . Family history of anesthesia complication    mother had "three heart attacks after anesthesia"  . Headache(784.0)    hx of  . History of stomach ulcers   . Hypertension    Dr. Jonelle Sidle [phone]  . Pneumonia    hx of   . Seasonal allergies   . Sleep apnea    cpap  . Stroke [facility])    left side weakness  . Urinary tract infection    hx of   Past Surgical History:  Procedure Laterality Date  . ABDOMINAL HYSTERECTOMY    . APPENDECTOMY    . BLADDER SURGERY    . CATARACT EXTRACTION W/PHACO  07/07/2012   Procedure: CATARACT EXTRACTION PHACO AND INTRAOCULAR LENS PLACEMENT (IOC);  Surgeon: Adonis Brook, MD;  Location: [place];  Service: Ophthalmology;  Laterality: Right;  . CESAREAN SECTION     x 1  . CHOLECYSTECTOMY    . EYE SURGERY     "seven eye surgery"  . KNEE SURGERY     left knee  . OVARIAN CYST REMOVAL    . TOTAL KNEE ARTHROPLASTY Right 11/12/2015   Procedure: TOTAL KNEE ARTHROPLASTY;  Surgeon: Frederik Pear, MD;  Location: [place];  Service: Orthopedics;  Laterality:  Right;  . TOTAL KNEE ARTHROPLASTY Left 04/23/2016   Procedure: LEFT TOTAL KNEE ARTHROPLASTY;  Surgeon: Frederik Pear, MD;  Location: [place];  Service: Orthopedics;  Laterality: Left;  . TUBAL LIGATION     History reviewed. No pertinent family history. Social History  Substance Use Topics  . Smoking status: Never Smoker  . Smokeless tobacco: Never Used  . Alcohol use No     Comment: social hx    OB History    No data available     Review of Systems  Constitutional: Negative.   Respiratory: Negative.   Cardiovascular: Negative.   Gastrointestinal: Negative.   Musculoskeletal: Negative.   Skin:       Laceration to the left foot    Allergies  Celebrex [celecoxib] and Latex  Home Medications   Prior to Admission medications   Medication Sig Start Date End Date Taking? Authorizing Provider  aspirin EC 325 MG tablet Take 1 tablet (325 mg total) by mouth 2 (two) times daily. 04/23/16   Leighton Parody, PA-C  atenolol (TENORMIN) 100 MG tablet Take 100 mg by mouth daily.    [provider]  brimonidine-timolol (COMBIGAN) 0.2-0.5 % ophthalmic solution Place 1 drop into both eyes every 12 (twelve) hours.    [provider]  esomeprazole ([place]) 40  MG capsule Take 40 mg by mouth daily before breakfast.    [provider]  fluticasone (FLONASE) 50 MCG/ACT nasal spray Place 1 spray into both nostrils daily.     [provider]  loteprednol (LOTEMAX) 0.5 % ophthalmic suspension Place 1 drop into both eyes daily.    [provider]  metFORMIN (GLUCOPHAGE) 500 MG tablet Take 500-1,000 mg by mouth 2 (two) times daily with a meal. Take 500mg  every morning; take an additional 500 mg if sugar is >200    [provider]  NIFEdipine (ADALAT CC) 90 MG 24 hr tablet Take 90 mg by mouth daily.    [provider]  spironolactone (ALDACTONE) 25 MG tablet Take 25 mg by mouth daily. 11/23/16   [provider]  valsartan-hydrochlorothiazide  (DIOVAN-HCT) 320-12.5 MG tablet Take 1 tablet by mouth daily.    [provider]   Meds Ordered and Administered this Visit  Medications - No data to display  BP (!) 138/54 (BP Location: Left Arm) Comment: notfied cma  Pulse 60   Temp 97.7 F (36.5 C) (Oral)   Resp 16   SpO2 96%  No data found.   Physical Exam  Constitutional: She is oriented to person, place, and time. She appears well-developed and well-nourished. No distress.  HENT:  Head: Normocephalic.  Right Ear: External ear normal.  Left Ear: External ear normal.  Eyes: Conjunctivae are normal.  Neck: Normal range of motion.  Cardiovascular: Normal rate and regular rhythm.   Pulmonary/Chest: Effort normal and breath sounds normal.  Abdominal: Soft. Bowel sounds are normal.  Musculoskeletal:       Feet:  Neurological: She is alert and oriented to person, place, and time.  Skin: Skin is warm and dry. Capillary refill takes less than 2 seconds. She is not diaphoretic.  Nursing note and vitals reviewed.   Urgent Care Course     Procedures (including critical care time)  Labs Review Labs Reviewed - No data to display  Imaging Review No results found.     MDM   1. Laceration of left foot, initial encounter    Wound was cleaned, and dressed in clinic, with antibiotic ointment. Recommend over-the-counter antibiotic ointment, counseling provided on wound management. Provided contact information for podiatry, for follow-up care if needed.    Barnet Glasgow, NP 12/15/16 1324

## 2017-01-07 ENCOUNTER — Ambulatory Visit: Payer: Medicare Other | Admitting: Neurology

## 2017-01-27 ENCOUNTER — Telehealth: Payer: Self-pay | Admitting: Neurology

## 2017-01-27 NOTE — Telephone Encounter (Signed)
I called pt's daughter, Tremeka, per DPR. She reports to me that [employer] needs something specific to be able to be able to provide cpap supplies for the pt. I advised her that I will reach out to [employer] to find out what exactly [employer] needs from [place]. Pt's daughter verbalized understanding.

## 2017-01-27 NOTE — Telephone Encounter (Signed)
Patient's daughter is calling to discuss patient's CPAP and supplies she needs. She does need a face mask and a filter. Please call and discuss.

## 2017-04-07 DIAGNOSIS — H401134 Primary open-angle glaucoma, bilateral, indeterminate stage: Secondary | ICD-10-CM | POA: Diagnosis not present

## 2017-04-07 DIAGNOSIS — H20021 Recurrent acute iridocyclitis, right eye: Secondary | ICD-10-CM | POA: Diagnosis not present

## 2017-04-07 DIAGNOSIS — H35371 Puckering of macula, right eye: Secondary | ICD-10-CM | POA: Diagnosis not present

## 2017-04-07 DIAGNOSIS — H04123 Dry eye syndrome of bilateral lacrimal glands: Secondary | ICD-10-CM | POA: Diagnosis not present

## 2017-04-07 DIAGNOSIS — H4041X2 Glaucoma secondary to eye inflammation, right eye, moderate stage: Secondary | ICD-10-CM | POA: Diagnosis not present

## 2017-04-07 DIAGNOSIS — H2512 Age-related nuclear cataract, left eye: Secondary | ICD-10-CM | POA: Diagnosis not present

## 2017-04-07 DIAGNOSIS — Z961 Presence of intraocular lens: Secondary | ICD-10-CM | POA: Diagnosis not present

## 2017-04-08 ENCOUNTER — Telehealth: Payer: Self-pay | Admitting: Neurology

## 2017-04-08 NOTE — Telephone Encounter (Signed)
I called pt's daughter, Samani Deal, per DPR, and advised her that I will reach out to [employer] to have them fix pt's cpap machine. Pt's daughter verbalized understanding.

## 2017-04-08 NOTE — Telephone Encounter (Signed)
Pt's daughter called in the CPAP machine is leaking at the base where the water is placed. It is still working but leaking. Please call to discuss

## 2017-04-13 DIAGNOSIS — I10 Essential (primary) hypertension: Secondary | ICD-10-CM | POA: Diagnosis not present

## 2017-04-13 DIAGNOSIS — E119 Type 2 diabetes mellitus without complications: Secondary | ICD-10-CM | POA: Diagnosis not present

## 2017-04-13 DIAGNOSIS — M1990 Unspecified osteoarthritis, unspecified site: Secondary | ICD-10-CM | POA: Diagnosis not present

## 2017-04-13 DIAGNOSIS — K219 Gastro-esophageal reflux disease without esophagitis: Secondary | ICD-10-CM | POA: Diagnosis not present

## 2017-04-16 ENCOUNTER — Other Ambulatory Visit: Payer: Self-pay | Admitting: Internal Medicine

## 2017-04-16 DIAGNOSIS — Z1231 Encounter for screening mammogram for malignant neoplasm of breast: Secondary | ICD-10-CM

## 2017-04-29 ENCOUNTER — Ambulatory Visit
Admission: RE | Admit: 2017-04-29 | Discharge: 2017-04-29 | Disposition: A | Discharge status: Home / Self Care | Payer: Medicare Other | Source: Ambulatory Visit | Attending: Internal Medicine | Admitting: Internal Medicine

## 2017-04-29 DIAGNOSIS — Z1231 Encounter for screening mammogram for malignant neoplasm of breast: Secondary | ICD-10-CM

## 2017-05-05 DIAGNOSIS — G4733 Obstructive sleep apnea (adult) (pediatric): Secondary | ICD-10-CM | POA: Diagnosis not present

## 2017-05-05 DIAGNOSIS — E1165 Type 2 diabetes mellitus with hyperglycemia: Secondary | ICD-10-CM | POA: Diagnosis not present

## 2017-05-05 DIAGNOSIS — R0989 Other specified symptoms and signs involving the circulatory and respiratory systems: Secondary | ICD-10-CM | POA: Diagnosis not present

## 2017-05-05 DIAGNOSIS — R0609 Other forms of dyspnea: Secondary | ICD-10-CM | POA: Diagnosis not present

## 2017-05-05 DIAGNOSIS — I10 Essential (primary) hypertension: Secondary | ICD-10-CM | POA: Diagnosis not present

## 2017-05-20 DIAGNOSIS — H401134 Primary open-angle glaucoma, bilateral, indeterminate stage: Secondary | ICD-10-CM | POA: Diagnosis not present

## 2017-05-20 DIAGNOSIS — H04123 Dry eye syndrome of bilateral lacrimal glands: Secondary | ICD-10-CM | POA: Diagnosis not present

## 2017-05-20 DIAGNOSIS — H20021 Recurrent acute iridocyclitis, right eye: Secondary | ICD-10-CM | POA: Diagnosis not present

## 2017-05-20 DIAGNOSIS — H35371 Puckering of macula, right eye: Secondary | ICD-10-CM | POA: Diagnosis not present

## 2017-05-20 DIAGNOSIS — Z961 Presence of intraocular lens: Secondary | ICD-10-CM | POA: Diagnosis not present

## 2017-05-20 DIAGNOSIS — H2512 Age-related nuclear cataract, left eye: Secondary | ICD-10-CM | POA: Diagnosis not present

## 2017-06-01 DIAGNOSIS — G4733 Obstructive sleep apnea (adult) (pediatric): Secondary | ICD-10-CM | POA: Diagnosis not present

## 2017-06-01 DIAGNOSIS — E1165 Type 2 diabetes mellitus with hyperglycemia: Secondary | ICD-10-CM | POA: Diagnosis not present

## 2017-06-01 DIAGNOSIS — E782 Mixed hyperlipidemia: Secondary | ICD-10-CM | POA: Diagnosis not present

## 2017-06-01 DIAGNOSIS — I10 Essential (primary) hypertension: Secondary | ICD-10-CM | POA: Diagnosis not present

## 2017-06-14 ENCOUNTER — Encounter: Payer: Self-pay | Admitting: Adult Health

## 2017-06-16 ENCOUNTER — Ambulatory Visit (INDEPENDENT_AMBULATORY_CARE_PROVIDER_SITE_OTHER): Payer: Medicare Other | Admitting: Adult Health

## 2017-06-16 ENCOUNTER — Encounter: Payer: Self-pay | Admitting: Adult Health

## 2017-06-16 VITALS — BP 110/72 | HR 60 | Wt 178.2 lb

## 2017-06-16 DIAGNOSIS — Z9989 Dependence on other enabling machines and devices: Secondary | ICD-10-CM

## 2017-06-16 DIAGNOSIS — G4733 Obstructive sleep apnea (adult) (pediatric): Secondary | ICD-10-CM | POA: Diagnosis not present

## 2017-06-16 NOTE — Patient Instructions (Signed)
Your Plan:  Continue using cpap nightly Mask refitting Increase pressure to 9 cm H20     Thank you for coming to see [place] at [facility]. I hope we have been able to provide you high quality care today.  You may receive a patient satisfaction survey over the next few weeks. We would appreciate your feedback and comments so that we may continue to improve ourselves and the health of our patients.

## 2017-06-16 NOTE — Progress Notes (Addendum)
PATIENT: Casey Liu DOB: May 21, 1935  REASON FOR VISIT: follow up HISTORY FROM: patient-daughter.  Daughter is the interpreter  HISTORY OF PRESENT ILLNESS: Today 06/16/17  Casey Liu is an 82 year old female with a history of obstructive sleep apnea on CPAP.  She returns today for compliance download.  Her download indicates that she use her machine 29 out of 30 days for compliance of 97%.  She use her machine greater than 4 hours 27 out of 30 days for compliance of 90%.  On average she uses her machine 7 hours and 34 minutes.  Her residual AHI is 15.9 on 8 cm of water.  Her leak in the 95th percentile is at 21.7.  At the last visit a chinstrap was given to the patient.  She reports that she is using this.  She does not report any changes with the CPAP.  She continues to tolerate it well.  HISTORY 12/11/16: reviewed her CPAP compliance data from 11/10/2016 through 12/09/2016, which is a total of 30 days, during which time she used her machine 29 days with percent used days greater than 4 hours at 90%, indicating excellent compliance with an average usage of 6 hours and 35 minutes, residual AHI is slightly suboptimal at 7.2 per hour, leak on the high side with the 95th percentile at 32.6 L/m on a pressure of 8 cm with EPR of 3. She reports feeling better. Sleep is more consolidated and better quality, nocturia is improved. She feels better during the day as well has adapted well to nasal pillows and tolerates them well. She does not notice and leak around the nasal pillows but may open her mouth at night. She would like to avoid changing the nasal pillows to mask. She would be willing to try a chinstrap.  The patient's allergies, current medications, family history, past medical history, past social history, past surgical history and problem list were reviewed and updated as appropriate  REVIEW OF SYSTEMS: Out of a complete 14 system review of symptoms, the patient complains  only of the following symptoms, and all other reviewed systems are negative.   Hearing loss, loss of vision, snoring, walking difficulty, headache  ALLERGIES: Allergies  Allergen Reactions  . Celebrex [Celecoxib] Nausea And Vomiting and Palpitations  . Latex Rash    HOME MEDICATIONS: Outpatient Medications Prior to Visit  Medication Sig Dispense Refill  . aspirin EC 325 MG tablet Take 1 tablet (325 mg total) by mouth 2 (two) times daily. 30 tablet 0  . atenolol (TENORMIN) 100 MG tablet Take 100 mg by mouth daily.    . brimonidine-timolol (COMBIGAN) 0.2-0.5 % ophthalmic solution Place 1 drop into both eyes every 12 (twelve) hours.    . fluticasone (FLONASE) 50 MCG/ACT nasal spray Place 1 spray into both nostrils daily.     . metFORMIN (GLUCOPHAGE) 500 MG tablet Take 500-1,000 mg by mouth 2 (two) times daily with a meal. Take 500mg  every morning; take an additional 500 mg if sugar is >200    . NIFEdipine (ADALAT CC) 90 MG 24 hr tablet Take 90 mg by mouth daily.    Marland Kitchen olmesartan-hydrochlorothiazide (BENICAR HCT) 40-12.5 MG tablet Take 1 tablet by mouth daily.  1  . rosuvastatin (CRESTOR) 10 MG tablet Take 10 mg by mouth daily.  1  . spironolactone (ALDACTONE) 25 MG tablet Take 25 mg by mouth daily.  5  . esomeprazole (NEXIUM) 40 MG capsule Take 40 mg by mouth daily before breakfast.    . loteprednol (  LOTEMAX) 0.5 % ophthalmic suspension Place 1 drop into both eyes daily.    . valsartan-hydrochlorothiazide (DIOVAN-HCT) 320-12.5 MG tablet Take 1 tablet by mouth daily.     No facility-administered medications prior to visit.     PAST MEDICAL HISTORY: Past Medical History:  Diagnosis Date  . Arthritis   . Cataract    right  . Diabetes mellitus without complication ([place])   . Family history of anesthesia complication    mother had "three heart attacks after anesthesia"  . Headache(784.0)    hx of  . History of stomach ulcers   . Hypertension    Dr. Jonelle Sidle [phone]  . Pneumonia      hx of   . Seasonal allergies   . Sleep apnea    cpap  . Stroke [facility])    left side weakness  . Urinary tract infection    hx of    PAST SURGICAL HISTORY: Past Surgical History:  Procedure Laterality Date  . ABDOMINAL HYSTERECTOMY    . APPENDECTOMY    . BLADDER SURGERY    . CATARACT EXTRACTION W/PHACO  07/07/2012   Procedure: CATARACT EXTRACTION PHACO AND INTRAOCULAR LENS PLACEMENT (IOC);  Surgeon: Adonis Brook, MD;  Location: [place];  Service: Ophthalmology;  Laterality: Right;  . CESAREAN SECTION     x 1  . CHOLECYSTECTOMY    . EYE SURGERY     "seven eye surgery"  . KNEE SURGERY     left knee  . OVARIAN CYST REMOVAL    . TOTAL KNEE ARTHROPLASTY Right 11/12/2015   Procedure: TOTAL KNEE ARTHROPLASTY;  Surgeon: Frederik Pear, MD;  Location: [place];  Service: Orthopedics;  Laterality: Right;  . TOTAL KNEE ARTHROPLASTY Left 04/23/2016   Procedure: LEFT TOTAL KNEE ARTHROPLASTY;  Surgeon: Frederik Pear, MD;  Location: [place];  Service: Orthopedics;  Laterality: Left;  . TUBAL LIGATION      FAMILY HISTORY: Family History  Problem Relation Age of Onset  . Breast cancer Daughter 67    SOCIAL HISTORY: Social History   Socioeconomic History  . Marital status: Single    Spouse name: Not on file  . Number of children: Not on file  . Years of education: Not on file  . Highest education level: Not on file  Social Needs  . Financial resource strain: Not on file  . Food insecurity - worry: Not on file  . Food insecurity - inability: Not on file  . Transportation needs - medical: Not on file  . Transportation needs - non-medical: Not on file  Occupational History  . Not on file  Tobacco Use  . Smoking status: Never Smoker  . Smokeless tobacco: Never Used  Substance and Sexual Activity  . Alcohol use: No    Comment: social hx   . Drug use: No  . Sexual activity: Not on file  Other Topics Concern  . Not on file  Social History Narrative  . Not on file      PHYSICAL  EXAM  Vitals:   06/16/17 1507  BP: 110/72  Pulse: 60  Weight: 178 lb 3.2 oz (80.8 kg)   Body mass index is 29.65 kg/m.  Generalized: Well developed, in no acute distress   Neurological examination  Mentation: Alert. Follows all commands speech and language fluent Cranial nerve II-XII: Pupils were equal round reactive to light. Extraocular movements were full, visual field were full on confrontational test. Facial sensation and strength were normal. Uvula tongue midline. Head turning and shoulder shrug  were normal and symmetric. Motor: The motor testing reveals 5 over 5 strength of all 4 extremities. Good symmetric motor tone is noted throughout.  Sensory: Sensory testing is intact to soft touch on all 4 extremities. No evidence of extinction is noted.  Coordination: Cerebellar testing reveals good finger-nose-finger and heel-to-shin bilaterally.  Gait and station: Gait is normal.  Reflexes: Deep tendon reflexes are symmetric and normal bilaterally.   DIAGNOSTIC DATA (LABS, IMAGING, TESTING) - I reviewed patient records, labs, notes, testing and imaging myself where available.  Lab Results  Component Value Date   WBC 10.0 04/25/2016   HGB 10.3 (L) 04/25/2016   HCT 33.0 (L) 04/25/2016   MCV 88.7 04/25/2016   PLT 162 04/25/2016      Component Value Date/Time   NA 138 04/24/2016 0521   K 4.1 04/24/2016 0521   CL 106 04/24/2016 0521   CO2 23 04/24/2016 0521   GLUCOSE 125 (H) 04/24/2016 0521   BUN 15 04/24/2016 0521   CREATININE 0.81 04/24/2016 0521   CALCIUM 8.8 (L) 04/24/2016 0521   PROT 7.9 05/19/2012 1048   ALBUMIN 3.6 05/19/2012 1048   AST 14 05/19/2012 1048   ALT 7 05/19/2012 1048   ALKPHOS 106 05/19/2012 1048   BILITOT 0.3 05/19/2012 1048   GFRNONAA >60 04/24/2016 0521   GFRAA >60 04/24/2016 0521      ASSESSMENT AND PLAN 82 y.o. year old female  has a past medical history of Arthritis, Cataract, Diabetes mellitus without complication ([place]), Family history of  anesthesia complication, OVZCHYIF(027.7), History of stomach ulcers, Hypertension, Pneumonia, Seasonal allergies, Sleep apnea, Stroke ([place]), and Urinary tract infection. here with:  1.  Obstructive sleep apnea  Patient CPAP download shows excellent compliance however her residual AHI has increased since the last visit.  She also has a significant leak.  I will order a mask refitting and also increase her pressure to 9 cm of water.  If her symptoms worsen or she develops new symptoms she should let us know.  She will follow-up in 6 months or sooner if needed.  I spent 15 minutes with the patient. 50% of this time was spent reviewing CPAP download    Ward Givens, MSN, NP-C 06/16/2017, 3:16 PM [facility] [address], [place], [place] [address] [phone]  I reviewed the above note and documentation by the Nurse Practitioner and agree with the history, physical exam, assessment and plan as outlined above. I was immediately available for face-to-face consultation. Star Age, MD, PhD [facility])

## 2017-06-16 NOTE — Progress Notes (Signed)
DME orders for CPAP successfully faxed to [facility].

## 2017-06-23 DIAGNOSIS — M67912 Unspecified disorder of synovium and tendon, left shoulder: Secondary | ICD-10-CM | POA: Diagnosis not present

## 2017-06-23 DIAGNOSIS — Z96652 Presence of left artificial knee joint: Secondary | ICD-10-CM | POA: Diagnosis not present

## 2017-06-23 DIAGNOSIS — M25561 Pain in right knee: Secondary | ICD-10-CM | POA: Diagnosis not present

## 2017-06-23 DIAGNOSIS — M67911 Unspecified disorder of synovium and tendon, right shoulder: Secondary | ICD-10-CM | POA: Diagnosis not present

## 2017-06-23 DIAGNOSIS — M25562 Pain in left knee: Secondary | ICD-10-CM | POA: Diagnosis not present

## 2017-06-23 DIAGNOSIS — Z96651 Presence of right artificial knee joint: Secondary | ICD-10-CM | POA: Diagnosis not present

## 2017-07-02 DIAGNOSIS — N814 Uterovaginal prolapse, unspecified: Secondary | ICD-10-CM | POA: Diagnosis not present

## 2017-07-08 DIAGNOSIS — M25512 Pain in left shoulder: Secondary | ICD-10-CM | POA: Diagnosis not present

## 2017-07-08 DIAGNOSIS — M7541 Impingement syndrome of right shoulder: Secondary | ICD-10-CM | POA: Diagnosis not present

## 2017-07-08 DIAGNOSIS — M25511 Pain in right shoulder: Secondary | ICD-10-CM | POA: Diagnosis not present

## 2017-07-08 DIAGNOSIS — M7542 Impingement syndrome of left shoulder: Secondary | ICD-10-CM | POA: Diagnosis not present

## 2017-07-10 DIAGNOSIS — M7541 Impingement syndrome of right shoulder: Secondary | ICD-10-CM | POA: Diagnosis not present

## 2017-07-10 DIAGNOSIS — M7542 Impingement syndrome of left shoulder: Secondary | ICD-10-CM | POA: Diagnosis not present

## 2017-07-10 DIAGNOSIS — M25512 Pain in left shoulder: Secondary | ICD-10-CM | POA: Diagnosis not present

## 2017-07-10 DIAGNOSIS — M25511 Pain in right shoulder: Secondary | ICD-10-CM | POA: Diagnosis not present

## 2017-07-22 DIAGNOSIS — M7542 Impingement syndrome of left shoulder: Secondary | ICD-10-CM | POA: Diagnosis not present

## 2017-07-22 DIAGNOSIS — M7541 Impingement syndrome of right shoulder: Secondary | ICD-10-CM | POA: Diagnosis not present

## 2017-07-22 DIAGNOSIS — M25511 Pain in right shoulder: Secondary | ICD-10-CM | POA: Diagnosis not present

## 2017-07-22 DIAGNOSIS — M25512 Pain in left shoulder: Secondary | ICD-10-CM | POA: Diagnosis not present

## 2017-07-24 DIAGNOSIS — M7542 Impingement syndrome of left shoulder: Secondary | ICD-10-CM | POA: Diagnosis not present

## 2017-07-24 DIAGNOSIS — M7541 Impingement syndrome of right shoulder: Secondary | ICD-10-CM | POA: Diagnosis not present

## 2017-07-24 DIAGNOSIS — M25512 Pain in left shoulder: Secondary | ICD-10-CM | POA: Diagnosis not present

## 2017-07-24 DIAGNOSIS — M25511 Pain in right shoulder: Secondary | ICD-10-CM | POA: Diagnosis not present

## 2017-07-27 DIAGNOSIS — M7541 Impingement syndrome of right shoulder: Secondary | ICD-10-CM | POA: Diagnosis not present

## 2017-07-27 DIAGNOSIS — M25511 Pain in right shoulder: Secondary | ICD-10-CM | POA: Diagnosis not present

## 2017-07-27 DIAGNOSIS — M7542 Impingement syndrome of left shoulder: Secondary | ICD-10-CM | POA: Diagnosis not present

## 2017-07-27 DIAGNOSIS — M25512 Pain in left shoulder: Secondary | ICD-10-CM | POA: Diagnosis not present

## 2017-07-28 DIAGNOSIS — M25512 Pain in left shoulder: Secondary | ICD-10-CM | POA: Diagnosis not present

## 2017-07-28 DIAGNOSIS — M25511 Pain in right shoulder: Secondary | ICD-10-CM | POA: Diagnosis not present

## 2017-07-31 DIAGNOSIS — M7542 Impingement syndrome of left shoulder: Secondary | ICD-10-CM | POA: Diagnosis not present

## 2017-07-31 DIAGNOSIS — M25512 Pain in left shoulder: Secondary | ICD-10-CM | POA: Diagnosis not present

## 2017-07-31 DIAGNOSIS — M7541 Impingement syndrome of right shoulder: Secondary | ICD-10-CM | POA: Diagnosis not present

## 2017-07-31 DIAGNOSIS — M25511 Pain in right shoulder: Secondary | ICD-10-CM | POA: Diagnosis not present

## 2017-08-03 DIAGNOSIS — M7542 Impingement syndrome of left shoulder: Secondary | ICD-10-CM | POA: Diagnosis not present

## 2017-08-03 DIAGNOSIS — M25512 Pain in left shoulder: Secondary | ICD-10-CM | POA: Diagnosis not present

## 2017-08-03 DIAGNOSIS — M7541 Impingement syndrome of right shoulder: Secondary | ICD-10-CM | POA: Diagnosis not present

## 2017-08-03 DIAGNOSIS — M25511 Pain in right shoulder: Secondary | ICD-10-CM | POA: Diagnosis not present

## 2017-09-01 DIAGNOSIS — M25511 Pain in right shoulder: Secondary | ICD-10-CM | POA: Diagnosis not present

## 2017-09-07 DIAGNOSIS — G4733 Obstructive sleep apnea (adult) (pediatric): Secondary | ICD-10-CM | POA: Diagnosis not present

## 2017-09-07 DIAGNOSIS — I10 Essential (primary) hypertension: Secondary | ICD-10-CM | POA: Diagnosis not present

## 2017-09-07 DIAGNOSIS — E782 Mixed hyperlipidemia: Secondary | ICD-10-CM | POA: Diagnosis not present

## 2017-09-07 DIAGNOSIS — E1165 Type 2 diabetes mellitus with hyperglycemia: Secondary | ICD-10-CM | POA: Diagnosis not present

## 2017-09-14 DIAGNOSIS — H04123 Dry eye syndrome of bilateral lacrimal glands: Secondary | ICD-10-CM | POA: Diagnosis not present

## 2017-09-14 DIAGNOSIS — Z961 Presence of intraocular lens: Secondary | ICD-10-CM | POA: Diagnosis not present

## 2017-09-14 DIAGNOSIS — H401134 Primary open-angle glaucoma, bilateral, indeterminate stage: Secondary | ICD-10-CM | POA: Diagnosis not present

## 2017-09-14 DIAGNOSIS — H4041X2 Glaucoma secondary to eye inflammation, right eye, moderate stage: Secondary | ICD-10-CM | POA: Diagnosis not present

## 2017-09-14 DIAGNOSIS — H35371 Puckering of macula, right eye: Secondary | ICD-10-CM | POA: Diagnosis not present

## 2017-09-14 DIAGNOSIS — H20021 Recurrent acute iridocyclitis, right eye: Secondary | ICD-10-CM | POA: Diagnosis not present

## 2017-09-14 DIAGNOSIS — H2512 Age-related nuclear cataract, left eye: Secondary | ICD-10-CM | POA: Diagnosis not present

## 2017-09-23 DIAGNOSIS — I10 Essential (primary) hypertension: Secondary | ICD-10-CM | POA: Diagnosis not present

## 2017-09-30 DIAGNOSIS — N814 Uterovaginal prolapse, unspecified: Secondary | ICD-10-CM | POA: Diagnosis not present

## 2017-10-27 DIAGNOSIS — I10 Essential (primary) hypertension: Secondary | ICD-10-CM | POA: Diagnosis not present

## 2017-10-30 DIAGNOSIS — G4733 Obstructive sleep apnea (adult) (pediatric): Secondary | ICD-10-CM | POA: Diagnosis not present

## 2017-10-30 DIAGNOSIS — E1165 Type 2 diabetes mellitus with hyperglycemia: Secondary | ICD-10-CM | POA: Diagnosis not present

## 2017-10-30 DIAGNOSIS — E782 Mixed hyperlipidemia: Secondary | ICD-10-CM | POA: Diagnosis not present

## 2017-10-30 DIAGNOSIS — I10 Essential (primary) hypertension: Secondary | ICD-10-CM | POA: Diagnosis not present

## 2017-12-13 ENCOUNTER — Encounter: Payer: Self-pay | Admitting: Adult Health

## 2017-12-16 ENCOUNTER — Ambulatory Visit (INDEPENDENT_AMBULATORY_CARE_PROVIDER_SITE_OTHER): Payer: Medicare Other | Admitting: Adult Health

## 2017-12-16 ENCOUNTER — Encounter: Payer: Self-pay | Admitting: Adult Health

## 2017-12-16 VITALS — BP 179/83 | HR 63 | Wt 181.6 lb

## 2017-12-16 DIAGNOSIS — G4733 Obstructive sleep apnea (adult) (pediatric): Secondary | ICD-10-CM

## 2017-12-16 DIAGNOSIS — Z9989 Dependence on other enabling machines and devices: Secondary | ICD-10-CM

## 2017-12-16 NOTE — Progress Notes (Addendum)
PATIENT: Casey Liu DOB: 1934/11/17  REASON FOR VISIT: follow up HISTORY FROM: patient  HISTORY OF PRESENT ILLNESS: Today 12/16/17 [name] is an 83 year old female with a history of obstructive sleep apnea on CPAP.  She returns today for compliance download.  Her download indicates that she use her machine 27 out of 30 days for compliance of 90%. She used her machine 4 hours each night.  On average she uses her machine 8 hours and 3 minutes.  Her residual AHI is 10.4 on 9 cmH2O with EPR 3.  Her leak in the 95th percentile is 31.1 L/min.  The patient did have a mask refitting at the last visit.  She does not have any complaints with the CPAP.  She is here with her daughter who is translating.  She returns today for evaluation.  HISTORY 06/16/17  [name] is an 83 year old female with a history of obstructive sleep apnea on CPAP.  She returns today for compliance download.  Her download indicates that she use her machine 29 out of 30 days for compliance of 97%.  She use her machine greater than 4 hours 27 out of 30 days for compliance of 90%.  On average she uses her machine 7 hours and 34 minutes.  Her residual AHI is 15.9 on 8 cm of water.  Her leak in the 95th percentile is at 21.7.  At the last visit a chinstrap was given to the patient.  She reports that she is using this.  She does not report any changes with the CPAP.  She continues to tolerate it well.    REVIEW OF SYSTEMS: Out of a complete 14 system review of symptoms, the patient complains only of the following symptoms, and all other reviewed systems are negative.   Hearing loss, wheezing, apnea, snoring, headache, memory loss, loss of vision  ALLERGIES: Allergies  Allergen Reactions  . Celebrex [Celecoxib] Nausea And Vomiting and Palpitations  . Latex Rash    HOME MEDICATIONS: Outpatient Medications Prior to Visit  Medication Sig Dispense Refill  . atenolol (TENORMIN) 100 MG tablet Take  100 mg by mouth daily.    . bimatoprost (LUMIGAN) 0.01 % SOLN [place] 0.01 % eye drops    . brimonidine-timolol (COMBIGAN) 0.2-0.5 % ophthalmic solution Place 1 drop into both eyes every 12 (twelve) hours.    . fluticasone (FLONASE) 50 MCG/ACT nasal spray Place 1 spray into both nostrils daily.     . metFORMIN (GLUCOPHAGE) 500 MG tablet Take 500-1,000 mg by mouth 2 (two) times daily with a meal. Take 500mg  every morning; take an additional 500 mg if sugar is >200    . NIFEdipine (ADALAT CC) 90 MG 24 hr tablet Take 90 mg by mouth daily.    Marland Kitchen olmesartan-hydrochlorothiazide (BENICAR HCT) 40-12.5 MG tablet Take 1 tablet by mouth daily.  1  . Polyethyl Glycol-Propyl Glycol (SYSTANE OP) Apply to eye.    Marland Kitchen aspirin EC 325 MG tablet Take 1 tablet (325 mg total) by mouth 2 (two) times daily. 30 tablet 0  . esomeprazole (NEXIUM) 40 MG capsule Take 40 mg by mouth daily before breakfast.    . loteprednol (LOTEMAX) 0.5 % ophthalmic suspension Place 1 drop into both eyes daily.    . rosuvastatin (CRESTOR) 10 MG tablet Take 10 mg by mouth daily.  1  . spironolactone (ALDACTONE) 25 MG tablet Take 25 mg by mouth daily.  5   No facility-administered medications prior to visit.     PAST MEDICAL  HISTORY: Past Medical History:  Diagnosis Date  . Arthritis   . Cataract    right  . Diabetes mellitus without complication ([place])   . Family history of anesthesia complication    mother had "three heart attacks after anesthesia"  . Headache(784.0)    hx of  . History of stomach ulcers   . Hypertension    Dr. Jonelle Sidle [phone]  . Pneumonia    hx of   . Seasonal allergies   . Sleep apnea    cpap  . Stroke [facility])    left side weakness  . Urinary tract infection    hx of    PAST SURGICAL HISTORY: Past Surgical History:  Procedure Laterality Date  . ABDOMINAL HYSTERECTOMY    . APPENDECTOMY    . BLADDER SURGERY    . CATARACT EXTRACTION W/PHACO  07/07/2012   Procedure: CATARACT EXTRACTION PHACO AND  INTRAOCULAR LENS PLACEMENT (IOC);  Surgeon: Adonis Brook, MD;  Location: [place];  Service: Ophthalmology;  Laterality: Right;  . CESAREAN SECTION     x 1  . CHOLECYSTECTOMY    . EYE SURGERY     "seven eye surgery"  . KNEE SURGERY     left knee  . OVARIAN CYST REMOVAL    . TOTAL KNEE ARTHROPLASTY Right 11/12/2015   Procedure: TOTAL KNEE ARTHROPLASTY;  Surgeon: Frederik Pear, MD;  Location: [place];  Service: Orthopedics;  Laterality: Right;  . TOTAL KNEE ARTHROPLASTY Left 04/23/2016   Procedure: LEFT TOTAL KNEE ARTHROPLASTY;  Surgeon: Frederik Pear, MD;  Location: [place];  Service: Orthopedics;  Laterality: Left;  . TUBAL LIGATION      FAMILY HISTORY: Family History  Problem Relation Age of Onset  . Breast cancer Daughter 54    SOCIAL HISTORY: Social History   Socioeconomic History  . Marital status: Single    Spouse name: Not on file  . Number of children: Not on file  . Years of education: Not on file  . Highest education level: Not on file  Occupational History  . Not on file  Social Needs  . Financial resource strain: Not on file  . Food insecurity:    Worry: Not on file    Inability: Not on file  . Transportation needs:    Medical: Not on file    Non-medical: Not on file  Tobacco Use  . Smoking status: Never Smoker  . Smokeless tobacco: Never Used  Substance and Sexual Activity  . Alcohol use: No    Comment: social hx   . Drug use: No  . Sexual activity: Not on file  Lifestyle  . Physical activity:    Days per week: Not on file    Minutes per session: Not on file  . Stress: Not on file  Relationships  . Social connections:    Talks on phone: Not on file    Gets together: Not on file    Attends religious service: Not on file    Active member of club or organization: Not on file    Attends meetings of clubs or organizations: Not on file    Relationship status: Not on file  . Intimate partner violence:    Fear of current or ex partner: Not on file    Emotionally  abused: Not on file    Physically abused: Not on file    Forced sexual activity: Not on file  Other Topics Concern  . Not on file  Social History Narrative  . Not on file  PHYSICAL EXAM  Vitals:   12/16/17 1421  BP: (!) 179/83  Pulse: 63  Weight: 181 lb 9.6 oz (82.4 kg)   Body mass index is 30.22 kg/m.  Generalized: Well developed, in no acute distress   Neurological examination  Mentation: Alert oriented to time, place, history taking. Follows all commands speech and language fluent Cranial nerve II-XII: Pupils were equal round reactive to light. Extraocular movements were full, visual field were full on confrontational test. Facial sensation and strength were normal. Uvula tongue midline. Head turning and shoulder shrug  were normal and symmetric. Motor: The motor testing reveals 5 over 5 strength of all 4 extremities. Good symmetric motor tone is noted throughout.  Sensory: Sensory testing is intact to soft touch on all 4 extremities. No evidence of extinction is noted.  Coordination: Cerebellar testing reveals good finger-nose-finger and heel-to-shin bilaterally.  Gait and station: Gait is normal.    DIAGNOSTIC DATA (LABS, IMAGING, TESTING) - I reviewed patient records, labs, notes, testing and imaging myself where available.  Lab Results  Component Value Date   WBC 10.0 04/25/2016   HGB 10.3 (L) 04/25/2016   HCT 33.0 (L) 04/25/2016   MCV 88.7 04/25/2016   PLT 162 04/25/2016      Component Value Date/Time   NA 138 04/24/2016 0521   K 4.1 04/24/2016 0521   CL 106 04/24/2016 0521   CO2 23 04/24/2016 0521   GLUCOSE 125 (H) 04/24/2016 0521   BUN 15 04/24/2016 0521   CREATININE 0.81 04/24/2016 0521   CALCIUM 8.8 (L) 04/24/2016 0521   PROT 7.9 05/19/2012 1048   ALBUMIN 3.6 05/19/2012 1048   AST 14 05/19/2012 1048   ALT 7 05/19/2012 1048   ALKPHOS 106 05/19/2012 1048   BILITOT 0.3 05/19/2012 1048   GFRNONAA >60 04/24/2016 0521   GFRAA >60 04/24/2016 0521        ASSESSMENT AND PLAN 83 y.o. year old female  has a past medical history of Arthritis, Cataract, Diabetes mellitus without complication ([place]), Family history of anesthesia complication, [id number](601.0), History of stomach ulcers, Hypertension, Pneumonia, Seasonal allergies, Sleep apnea, Stroke ([place]), and Urinary tract infection. here with :  1.  Obstructive sleep apnea on CPAP  The patient CPAP download shows excellent compliance however her residual AHI remains elevated.  I will change her to AutoSet 5 to 15 cm of water.  I have encouraged the patient to make sure she takes her straps to avoid a leak with the mask.  She voiced understanding.  She will follow-up in 6 months or sooner if needed.   I spent 15 minutes with the patient. 50% of this time was spent reviewing her download   Ward Givens, MSN, NP-C 12/16/2017, 2:25 PM [facility] [address] [place], [place] [address] [phone]  I reviewed the above note and documentation by the Nurse Practitioner and agree with the history, physical exam, assessment and plan as outlined above. I was immediately available for face-to-face consultation. Star Age, MD, PhD [facility])

## 2017-12-16 NOTE — Patient Instructions (Signed)
Your Plan:  Continue using CPAP nightly Will change pressure to autoset 5-15 cm h20 Make sure you put mask on and adjust straps to avoid leak If your symptoms worsen or you develop new symptoms please let us know.   Thank you for coming to see [place] at [facility]. I hope we have been able to provide you high quality care today.  You may receive a patient satisfaction survey over the next few weeks. We would appreciate your feedback and comments so that we may continue to improve ourselves and the health of our patients.

## 2017-12-29 DIAGNOSIS — E119 Type 2 diabetes mellitus without complications: Secondary | ICD-10-CM | POA: Diagnosis not present

## 2017-12-29 DIAGNOSIS — R609 Edema, unspecified: Secondary | ICD-10-CM | POA: Diagnosis not present

## 2017-12-29 DIAGNOSIS — I10 Essential (primary) hypertension: Secondary | ICD-10-CM | POA: Diagnosis not present

## 2017-12-29 DIAGNOSIS — E785 Hyperlipidemia, unspecified: Secondary | ICD-10-CM | POA: Diagnosis not present

## 2017-12-31 DIAGNOSIS — N814 Uterovaginal prolapse, unspecified: Secondary | ICD-10-CM | POA: Diagnosis not present

## 2018-01-04 DIAGNOSIS — H2512 Age-related nuclear cataract, left eye: Secondary | ICD-10-CM | POA: Diagnosis not present

## 2018-01-04 DIAGNOSIS — E119 Type 2 diabetes mellitus without complications: Secondary | ICD-10-CM | POA: Diagnosis not present

## 2018-01-04 DIAGNOSIS — H4041X2 Glaucoma secondary to eye inflammation, right eye, moderate stage: Secondary | ICD-10-CM | POA: Diagnosis not present

## 2018-01-04 DIAGNOSIS — H35371 Puckering of macula, right eye: Secondary | ICD-10-CM | POA: Diagnosis not present

## 2018-01-04 DIAGNOSIS — H20021 Recurrent acute iridocyclitis, right eye: Secondary | ICD-10-CM | POA: Diagnosis not present

## 2018-01-04 DIAGNOSIS — Z961 Presence of intraocular lens: Secondary | ICD-10-CM | POA: Diagnosis not present

## 2018-01-04 DIAGNOSIS — H04123 Dry eye syndrome of bilateral lacrimal glands: Secondary | ICD-10-CM | POA: Diagnosis not present

## 2018-01-04 DIAGNOSIS — H401134 Primary open-angle glaucoma, bilateral, indeterminate stage: Secondary | ICD-10-CM | POA: Diagnosis not present

## 2018-01-31 ENCOUNTER — Emergency Department (HOSPITAL_COMMUNITY)
Admission: EM | Admit: 2018-01-31 | Discharge: 2018-01-31 | Disposition: A | Discharge status: Home / Self Care | Payer: Medicare Other | Attending: Emergency Medicine | Admitting: Emergency Medicine

## 2018-01-31 ENCOUNTER — Other Ambulatory Visit: Payer: Self-pay

## 2018-01-31 ENCOUNTER — Encounter (HOSPITAL_COMMUNITY): Payer: Self-pay | Admitting: Emergency Medicine

## 2018-01-31 ENCOUNTER — Emergency Department (HOSPITAL_COMMUNITY): Payer: Medicare Other

## 2018-01-31 DIAGNOSIS — R42 Dizziness and giddiness: Secondary | ICD-10-CM | POA: Diagnosis not present

## 2018-01-31 DIAGNOSIS — R1030 Lower abdominal pain, unspecified: Secondary | ICD-10-CM | POA: Diagnosis not present

## 2018-01-31 DIAGNOSIS — E119 Type 2 diabetes mellitus without complications: Secondary | ICD-10-CM | POA: Insufficient documentation

## 2018-01-31 DIAGNOSIS — Z7984 Long term (current) use of oral hypoglycemic drugs: Secondary | ICD-10-CM | POA: Diagnosis not present

## 2018-01-31 DIAGNOSIS — I10 Essential (primary) hypertension: Secondary | ICD-10-CM | POA: Diagnosis not present

## 2018-01-31 DIAGNOSIS — Z91040 Latex allergy status: Secondary | ICD-10-CM | POA: Insufficient documentation

## 2018-01-31 DIAGNOSIS — Z96653 Presence of artificial knee joint, bilateral: Secondary | ICD-10-CM | POA: Diagnosis not present

## 2018-01-31 DIAGNOSIS — N390 Urinary tract infection, site not specified: Secondary | ICD-10-CM | POA: Insufficient documentation

## 2018-01-31 DIAGNOSIS — E860 Dehydration: Secondary | ICD-10-CM | POA: Diagnosis not present

## 2018-01-31 DIAGNOSIS — Z79899 Other long term (current) drug therapy: Secondary | ICD-10-CM | POA: Diagnosis not present

## 2018-01-31 DIAGNOSIS — J811 Chronic pulmonary edema: Secondary | ICD-10-CM | POA: Diagnosis not present

## 2018-01-31 LAB — CBC
HCT: 45.2 % (ref 36.0–46.0)
Hemoglobin: 13.9 g/dL (ref 12.0–15.0)
MCH: 29 pg (ref 26.0–34.0)
MCHC: 30.8 g/dL (ref 30.0–36.0)
MCV: 94.4 fL (ref 78.0–100.0)
Platelets: 256 10*3/uL (ref 150–400)
RBC: 4.79 MIL/uL (ref 3.87–5.11)
RDW: 13.8 % (ref 11.5–15.5)
WBC: 7.4 10*3/uL (ref 4.0–10.5)

## 2018-01-31 LAB — URINALYSIS, ROUTINE W REFLEX MICROSCOPIC
Bilirubin Urine: NEGATIVE
Glucose, UA: NEGATIVE mg/dL
Hgb urine dipstick: NEGATIVE
Ketones, ur: NEGATIVE mg/dL
Nitrite: NEGATIVE
Protein, ur: NEGATIVE mg/dL
Specific Gravity, Urine: 1.014 (ref 1.005–1.030)
WBC, UA: >50 WBC/hpf — ABNORMAL HIGH (ref 0–5)
pH: 5 (ref 5.0–8.0)

## 2018-01-31 LAB — BASIC METABOLIC PANEL
Anion gap: 12 (ref 5–15)
BUN: 32 mg/dL — ABNORMAL HIGH (ref 8–23)
CO2: 25 mmol/L (ref 22–32)
Calcium: 10.9 mg/dL — ABNORMAL HIGH (ref 8.9–10.3)
Chloride: 103 mmol/L (ref 98–111)
Creatinine, Ser: 1.26 mg/dL — ABNORMAL HIGH (ref 0.44–1.00)
GFR calc Af Amer: 45 mL/min — ABNORMAL LOW (ref >60)
GFR calc non Af Amer: 39 mL/min — ABNORMAL LOW (ref >60)
Glucose, Bld: 109 mg/dL — ABNORMAL HIGH (ref 70–99)
Potassium: 4.9 mmol/L (ref 3.5–5.1)
Sodium: 140 mmol/L (ref 135–145)

## 2018-01-31 MED ORDER — SODIUM CHLORIDE 0.9 % IV SOLN
1.0000 g | Freq: Once | INTRAVENOUS | Status: AC
  Administered 2018-01-31: 1 g via INTRAVENOUS
  Filled 2018-01-31: qty 10

## 2018-01-31 MED ORDER — MORPHINE SULFATE (PF) 4 MG/ML IV SOLN
4.0000 mg | Freq: Once | INTRAVENOUS | Status: AC
Start: 2018-01-31 — End: 2018-01-31
  Administered 2018-01-31: 4 mg via INTRAVENOUS
  Filled 2018-01-31: qty 1

## 2018-01-31 MED ORDER — LACTATED RINGERS IV BOLUS
1000.0000 mL | Freq: Once | INTRAVENOUS | Status: AC
  Administered 2018-01-31: 1000 mL via INTRAVENOUS

## 2018-01-31 MED ORDER — CEPHALEXIN 500 MG PO CAPS: 500.0000 mg | ORAL_CAPSULE | Freq: Three times a day (TID) | ORAL | 0 refills | Status: DC

## 2018-01-31 NOTE — ED Notes (Signed)
Pt left for xray

## 2018-01-31 NOTE — ED Provider Notes (Signed)
[place] EMERGENCY DEPARTMENT Provider Note   CSN: [id number] Arrival date & time: 01/31/18  1041     History   Chief Complaint Chief Complaint  Patient presents with  . Hypotension  . Dizziness    HPI Casey Liu is a 83 y.o. female.  HPI   Language barrier.  Interpreter used.  83 year old female with about 2 to 3-week history of generalized weakness.  She feels like she is has no energy.  She also has been having pain in her right flank to her right mid back.  No fevers or chills.  No nausea or vomiting.  The past couple days she has felt dizzy with changes in position.  No cough.  No shortness of breath.  She states she urinates frequently but has no dysuria.  Past Medical History:  Diagnosis Date  . Arthritis   . Cataract    right  . Diabetes mellitus without complication ([place])   . Family history of anesthesia complication    mother had "three heart attacks after anesthesia"  . Headache(784.0)    hx of  . History of stomach ulcers   . Hypertension    Dr. Jonelle Sidle [phone]  . Pneumonia    hx of   . Seasonal allergies   . Sleep apnea    cpap  . Stroke [facility])    left side weakness  . Urinary tract infection    hx of    Patient Active Problem List   Diagnosis Date Noted  . Primary osteoarthritis of left knee 04/23/2016  . Primary localized osteoarthritis of left knee 04/23/2016  . Primary osteoarthritis of right knee 11/11/2015  . 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 Vaginitis 08/16/2012    Past Surgical History:  Procedure Laterality Date  . ABDOMINAL HYSTERECTOMY    . APPENDECTOMY    . BLADDER SURGERY    . CATARACT EXTRACTION W/PHACO  07/07/2012   Procedure: CATARACT EXTRACTION PHACO AND INTRAOCULAR LENS PLACEMENT (IOC);  Surgeon: Adonis Brook, MD;  Location: [place];  Service: Ophthalmology;  Laterality: Right;  . CESAREAN SECTION     x 1  . CHOLECYSTECTOMY    . EYE SURGERY     "seven eye surgery"  . KNEE  SURGERY     left knee  . OVARIAN CYST REMOVAL    . TOTAL KNEE ARTHROPLASTY Right 11/12/2015   Procedure: TOTAL KNEE ARTHROPLASTY;  Surgeon: Frederik Pear, MD;  Location: [place];  Service: Orthopedics;  Laterality: Right;  . TOTAL KNEE ARTHROPLASTY Left 04/23/2016   Procedure: LEFT TOTAL KNEE ARTHROPLASTY;  Surgeon: Frederik Pear, MD;  Location: [place];  Service: Orthopedics;  Laterality: Left;  . TUBAL LIGATION       OB History   None      Home Medications    Prior to Admission medications   Medication Sig Start Date End Date Taking? Authorizing Provider  atenolol (TENORMIN) 100 MG tablet Take 100 mg by mouth daily.    [provider]  bimatoprost (LUMIGAN) 0.01 % SOLN Lumigan 0.01 % eye drops    [provider]  brimonidine-timolol (COMBIGAN) 0.2-0.5 % ophthalmic solution Place 1 drop into both eyes every 12 (twelve) hours.    [provider]  cephALEXin (KEFLEX) 500 MG capsule Take 1 capsule (500 mg total) by mouth 3 (three) times daily. 01/31/18   Virgel Manifold, MD  fluticasone (FLONASE) 50 MCG/ACT nasal spray Place 1 spray into both nostrils daily.     [provider]  metFORMIN (GLUCOPHAGE) 500 MG tablet Take 500-1,000 mg by mouth 2 (two) times daily with a meal. Take 500mg  every morning; take an additional 500 mg if sugar is >200    [provider]  NIFEdipine (ADALAT CC) 90 MG 24 hr tablet Take 90 mg by mouth daily.    [provider]  olmesartan-hydrochlorothiazide (BENICAR HCT) 40-12.5 MG tablet Take 1 tablet by mouth daily. 06/01/17   [provider]  Polyethyl Glycol-Propyl Glycol (SYSTANE OP) Apply to eye.    [provider]    Family History Family History  Problem Relation Age of Onset  . Breast cancer Daughter 78    Social History Social History   Tobacco Use  . Smoking status: Never Smoker  . Smokeless tobacco: Never Used  Substance Use Topics  . Alcohol use: No    Comment: social hx   . Drug  use: No     Allergies   Celebrex [celecoxib] and Latex   Review of Systems Review of Systems  All systems reviewed and negative, other than as noted in HPI.  Physical Exam Updated Vital Signs BP 134/65   Pulse (!) 59   Temp 97.6 F (36.4 C)   Resp 14   SpO2 96%   Physical Exam  Constitutional: She appears well-developed and well-nourished. No distress.  HENT:  Head: Normocephalic and atraumatic.  Eyes: Conjunctivae are normal. Right eye exhibits no discharge. Left eye exhibits no discharge.  Neck: Neck supple.  Cardiovascular: Normal rate, regular rhythm and normal heart sounds. Exam reveals no gallop and no friction rub.  No murmur heard. Pulmonary/Chest: Effort normal and breath sounds normal. No respiratory distress.  Abdominal: Soft. She exhibits no distension. There is tenderness.  Mild tenderness across lower abdomen without rebound or guarding.  Musculoskeletal: She exhibits no edema or tenderness.  Neurological: She is alert.  Skin: Skin is warm and dry.  Psychiatric: She has a normal mood and affect. Her behavior is normal. Thought content normal.  Nursing note and vitals reviewed.    ED Treatments / Results  Labs (all labs ordered are listed, but only abnormal results are displayed) Labs Reviewed  BASIC METABOLIC PANEL - Abnormal; Notable for the following components:      Result Value   Glucose, Bld 109 (*)    BUN 32 (*)    Creatinine, Ser 1.26 (*)    Calcium 10.9 (*)    GFR calc non Af Amer 39 (*)    GFR calc Af Amer 45 (*)    All other components within normal limits  URINALYSIS, ROUTINE W REFLEX MICROSCOPIC - Abnormal; Notable for the following components:   APPearance HAZY (*)    Leukocytes, UA LARGE (*)    WBC, UA >50 (*)    Bacteria, UA RARE (*)    All other components within normal limits  URINE CULTURE  CBC  CBG MONITORING, ED    EKG EKG Interpretation  Date/Time:  Sunday January 31 2018 12:14:35 EDT Ventricular Rate:  60 PR  Interval:    QRS Duration: 106 QT Interval:  412 QTC Calculation: 412 R Axis:   20 Text Interpretation:  Sinus rhythm Abnormal R-wave progression, early transition Non-specific ST-t changes Confirmed by Virgel Manifold [phone]) on 01/31/2018 1:36:42 PM   Radiology Dg Chest 2 View  Result Date: 01/31/2018 CLINICAL DATA:  Pt reports nausea, dizziness and feeling like she is going to pass out, the patient's family member reports her blood pressure was 92/50 this am, also reports  feeling like her heart is racing. Reports symptoms x1 month. EXAM: CHEST - 2 VIEW COMPARISON:  04/15/2016 FINDINGS: Shallow lung inflation. The heart is mildly enlarged. There are prominent interstitial markings at the bases, consistent with mild pulmonary edema. No overt alveolar edema. Degenerative changes are seen in the spine and shoulders. IMPRESSION: Cardiomegaly and mild interstitial edema. Electronically Signed   By: Nolon Nations M.D.   On: 01/31/2018 13:05    Procedures Procedures (including critical care time)  Medications Ordered in ED Medications  lactated ringers bolus 1,000 mL (0 mLs Intravenous Stopped 01/31/18 1555)  morphine 4 MG/ML injection 4 mg (4 mg Intravenous Given 01/31/18 1303)  cefTRIAXone (ROCEPHIN) 1 g in sodium chloride 0.9 % 100 mL IVPB (0 g Intravenous Stopped 01/31/18 1556)     Initial Impression / Assessment and Plan / ED Course  I have reviewed the triage vital signs and the nursing notes.  Pertinent labs & imaging results that were available during my care of the patient were reviewed by me and considered in my medical decision making (see chart for details).     83 year old female with pain in her right flank to right mid back.  UA is consistent with UTI.  This may explain her flank pain.  She has no CVA tenderness on exam though.  She is afebrile.  Will obtain a urine culture and start her on antibiotics.  This could explain her mild tenderness in her lower abdomen as well as  her generalized weakness.  I doubt serious acute intra-abdominal process.  I feel she is ready for outpatient treatment.  She was given a dose of Rocephin she will be continued on Keflex pending urine cultures.  She may be a little bit dehydrated based on her labs.  She was given IV fluids.  Return precautions were discussed with her and her family.  Final Clinical Impressions(s) / ED Diagnoses   Final diagnoses:  Urinary tract infection without hematuria, site unspecified  Dehydration    ED Discharge Orders        Ordered    cephALEXin (KEFLEX) 500 MG capsule  3 times daily     01/31/18 1553       Virgel Manifold, MD 01/31/18 1604

## 2018-01-31 NOTE — ED Triage Notes (Signed)
Pt reports nausea, dizziness and feeling like she is going to pass out, pts family member reports her bp was 53/50 this am, also reports feeling like her heart is racing. Reports symptoms x1 month.  Pt takes bp meds. Did not take them today

## 2018-02-01 LAB — URINE CULTURE: Culture: 70000 — AB

## 2018-02-02 ENCOUNTER — Telehealth: Payer: Self-pay | Admitting: *Deleted

## 2018-02-02 NOTE — Telephone Encounter (Signed)
Post ED Visit - Positive Culture Follow-up  Culture report reviewed by antimicrobial stewardship pharmacist:  []  Elenor Quinones, Pharm.D. []  Heide Guile, Pharm.D., BCPS AQ-ID []  Parks Neptune, Pharm.D., BCPS []  Alycia Rossetti, Pharm.D., BCPS []  [place], Pharm.D., BCPS, AAHIVP []  Legrand Como, Pharm.D., BCPS, AAHIVP [x]  Salome Arnt, PharmD, BCPS []  Johnnette Gourd, PharmD, BCPS []  Hughes Better, PharmD, BCPS []  Leeroy Cha, PharmD  Positive urine culture Treated with Cephalexin, organism sensitive to the same and no further patient follow-up is required at this time.  Harlon Flor [facility] 02/02/2018, 9:54 AM

## 2018-02-04 DIAGNOSIS — K219 Gastro-esophageal reflux disease without esophagitis: Secondary | ICD-10-CM | POA: Diagnosis not present

## 2018-02-04 DIAGNOSIS — K581 Irritable bowel syndrome with constipation: Secondary | ICD-10-CM | POA: Diagnosis not present

## 2018-02-04 DIAGNOSIS — R140 Abdominal distension (gaseous): Secondary | ICD-10-CM | POA: Diagnosis not present

## 2018-02-04 DIAGNOSIS — K625 Hemorrhage of anus and rectum: Secondary | ICD-10-CM | POA: Diagnosis not present

## 2018-02-23 ENCOUNTER — Other Ambulatory Visit: Payer: Self-pay | Admitting: Gastroenterology

## 2018-02-23 DIAGNOSIS — K5904 Chronic idiopathic constipation: Secondary | ICD-10-CM | POA: Diagnosis not present

## 2018-02-23 DIAGNOSIS — R1033 Periumbilical pain: Secondary | ICD-10-CM | POA: Diagnosis not present

## 2018-02-23 DIAGNOSIS — K219 Gastro-esophageal reflux disease without esophagitis: Secondary | ICD-10-CM | POA: Diagnosis not present

## 2018-02-23 DIAGNOSIS — R634 Abnormal weight loss: Secondary | ICD-10-CM | POA: Diagnosis not present

## 2018-03-04 ENCOUNTER — Encounter (HOSPITAL_COMMUNITY): Payer: Self-pay

## 2018-03-04 ENCOUNTER — Ambulatory Visit (HOSPITAL_COMMUNITY)
Admission: RE | Admit: 2018-03-04 | Discharge: 2018-03-04 | Disposition: A | Discharge status: Home / Self Care | Payer: Medicare Other | Source: Ambulatory Visit | Attending: Gastroenterology | Admitting: Gastroenterology

## 2018-03-04 ENCOUNTER — Emergency Department (HOSPITAL_COMMUNITY)
Admission: EM | Admit: 2018-03-04 | Discharge: 2018-03-04 | Disposition: A | Discharge status: Home / Self Care | Payer: Medicare Other | Attending: Emergency Medicine | Admitting: Emergency Medicine

## 2018-03-04 ENCOUNTER — Ambulatory Visit
Admission: RE | Admit: 2018-03-04 | Discharge: 2018-03-04 | Disposition: A | Discharge status: Home / Self Care | Payer: Medicare Other | Source: Ambulatory Visit | Attending: Gastroenterology | Admitting: Gastroenterology

## 2018-03-04 ENCOUNTER — Other Ambulatory Visit: Payer: Self-pay | Admitting: Gastroenterology

## 2018-03-04 DIAGNOSIS — R634 Abnormal weight loss: Secondary | ICD-10-CM

## 2018-03-04 DIAGNOSIS — E860 Dehydration: Secondary | ICD-10-CM | POA: Diagnosis not present

## 2018-03-04 DIAGNOSIS — Z5321 Procedure and treatment not carried out due to patient leaving prior to being seen by health care provider: Secondary | ICD-10-CM | POA: Diagnosis not present

## 2018-03-04 MED ORDER — IOPAMIDOL (ISOVUE-300) INJECTION 61%: 75.0000 mL | Freq: Once | INTRAVENOUS | Status: DC | PRN

## 2018-03-08 ENCOUNTER — Other Ambulatory Visit (HOSPITAL_COMMUNITY): Payer: Self-pay | Admitting: Gastroenterology

## 2018-03-08 ENCOUNTER — Ambulatory Visit (HOSPITAL_COMMUNITY)
Admission: RE | Admit: 2018-03-08 | Discharge: 2018-03-08 | Disposition: A | Discharge status: Home / Self Care | Payer: Medicare Other | Source: Ambulatory Visit | Attending: Gastroenterology | Admitting: Gastroenterology

## 2018-03-08 ENCOUNTER — Encounter (HOSPITAL_COMMUNITY): Payer: Self-pay

## 2018-03-08 DIAGNOSIS — R634 Abnormal weight loss: Secondary | ICD-10-CM

## 2018-03-08 DIAGNOSIS — K5730 Diverticulosis of large intestine without perforation or abscess without bleeding: Secondary | ICD-10-CM | POA: Diagnosis not present

## 2018-03-08 DIAGNOSIS — I700 Atherosclerosis of aorta: Secondary | ICD-10-CM | POA: Diagnosis not present

## 2018-03-08 DIAGNOSIS — K449 Diaphragmatic hernia without obstruction or gangrene: Secondary | ICD-10-CM | POA: Diagnosis not present

## 2018-03-08 DIAGNOSIS — R52 Pain, unspecified: Secondary | ICD-10-CM

## 2018-03-08 MED ORDER — IOPAMIDOL (ISOVUE-300) INJECTION 61%
INTRAVENOUS | Status: AC
  Filled 2018-03-08: qty 100

## 2018-03-08 MED ORDER — IOPAMIDOL (ISOVUE-300) INJECTION 61%
100.0000 mL | Freq: Once | INTRAVENOUS | Status: AC | PRN
  Administered 2018-03-08: 100 mL via INTRAVENOUS

## 2018-03-09 ENCOUNTER — Other Ambulatory Visit: Payer: Self-pay | Admitting: Gastroenterology

## 2018-03-09 DIAGNOSIS — R109 Unspecified abdominal pain: Secondary | ICD-10-CM | POA: Diagnosis not present

## 2018-03-09 DIAGNOSIS — R634 Abnormal weight loss: Secondary | ICD-10-CM | POA: Diagnosis not present

## 2018-03-09 DIAGNOSIS — K5904 Chronic idiopathic constipation: Secondary | ICD-10-CM | POA: Diagnosis not present

## 2018-03-09 DIAGNOSIS — K219 Gastro-esophageal reflux disease without esophagitis: Secondary | ICD-10-CM | POA: Diagnosis not present

## 2018-03-10 DIAGNOSIS — I10 Essential (primary) hypertension: Secondary | ICD-10-CM | POA: Diagnosis not present

## 2018-03-10 DIAGNOSIS — E1165 Type 2 diabetes mellitus with hyperglycemia: Secondary | ICD-10-CM | POA: Diagnosis not present

## 2018-03-10 DIAGNOSIS — E782 Mixed hyperlipidemia: Secondary | ICD-10-CM | POA: Diagnosis not present

## 2018-03-10 DIAGNOSIS — G4733 Obstructive sleep apnea (adult) (pediatric): Secondary | ICD-10-CM | POA: Diagnosis not present

## 2018-03-18 ENCOUNTER — Ambulatory Visit (HOSPITAL_COMMUNITY)
Admission: RE | Admit: 2018-03-18 | Discharge: 2018-03-18 | Disposition: A | Discharge status: Home / Self Care | Payer: Medicare Other | Source: Ambulatory Visit | Attending: Gastroenterology | Admitting: Gastroenterology

## 2018-03-18 DIAGNOSIS — R634 Abnormal weight loss: Secondary | ICD-10-CM | POA: Diagnosis not present

## 2018-03-18 DIAGNOSIS — D3500 Benign neoplasm of unspecified adrenal gland: Secondary | ICD-10-CM | POA: Diagnosis not present

## 2018-03-18 DIAGNOSIS — R109 Unspecified abdominal pain: Secondary | ICD-10-CM | POA: Diagnosis not present

## 2018-03-18 LAB — CREATININE, SERUM
Creatinine, Ser: 0.97 mg/dL (ref 0.44–1.00)
GFR calc Af Amer: >60 mL/min (ref >60)
GFR calc non Af Amer: 54 mL/min — ABNORMAL LOW (ref >60)

## 2018-03-18 MED ORDER — GADOBENATE DIMEGLUMINE 529 MG/ML IV SOLN
17.0000 mL | Freq: Once | INTRAVENOUS | Status: AC | PRN
  Administered 2018-03-18: 17 mL via INTRAVENOUS

## 2018-03-25 DIAGNOSIS — K625 Hemorrhage of anus and rectum: Secondary | ICD-10-CM | POA: Diagnosis not present

## 2018-03-25 DIAGNOSIS — K219 Gastro-esophageal reflux disease without esophagitis: Secondary | ICD-10-CM | POA: Diagnosis not present

## 2018-03-25 DIAGNOSIS — K581 Irritable bowel syndrome with constipation: Secondary | ICD-10-CM | POA: Diagnosis not present

## 2018-03-25 DIAGNOSIS — E669 Obesity, unspecified: Secondary | ICD-10-CM | POA: Diagnosis not present

## 2018-04-09 DIAGNOSIS — L821 Other seborrheic keratosis: Secondary | ICD-10-CM | POA: Diagnosis not present

## 2018-05-06 DIAGNOSIS — I10 Essential (primary) hypertension: Secondary | ICD-10-CM | POA: Diagnosis not present

## 2018-05-06 DIAGNOSIS — E119 Type 2 diabetes mellitus without complications: Secondary | ICD-10-CM | POA: Diagnosis not present

## 2018-05-06 DIAGNOSIS — E785 Hyperlipidemia, unspecified: Secondary | ICD-10-CM | POA: Diagnosis not present

## 2018-05-17 DIAGNOSIS — H20021 Recurrent acute iridocyclitis, right eye: Secondary | ICD-10-CM | POA: Diagnosis not present

## 2018-05-17 DIAGNOSIS — Z961 Presence of intraocular lens: Secondary | ICD-10-CM | POA: Diagnosis not present

## 2018-05-17 DIAGNOSIS — H35371 Puckering of macula, right eye: Secondary | ICD-10-CM | POA: Diagnosis not present

## 2018-05-17 DIAGNOSIS — H401112 Primary open-angle glaucoma, right eye, moderate stage: Secondary | ICD-10-CM | POA: Diagnosis not present

## 2018-05-17 DIAGNOSIS — E119 Type 2 diabetes mellitus without complications: Secondary | ICD-10-CM | POA: Diagnosis not present

## 2018-05-17 DIAGNOSIS — H04123 Dry eye syndrome of bilateral lacrimal glands: Secondary | ICD-10-CM | POA: Diagnosis not present

## 2018-05-17 DIAGNOSIS — H2512 Age-related nuclear cataract, left eye: Secondary | ICD-10-CM | POA: Diagnosis not present

## 2018-05-17 DIAGNOSIS — H401134 Primary open-angle glaucoma, bilateral, indeterminate stage: Secondary | ICD-10-CM | POA: Diagnosis not present

## 2018-05-25 ENCOUNTER — Other Ambulatory Visit: Payer: Self-pay | Admitting: Internal Medicine

## 2018-05-25 ENCOUNTER — Telehealth: Payer: Self-pay | Admitting: Adult Health

## 2018-05-25 ENCOUNTER — Telehealth: Payer: Self-pay | Admitting: *Deleted

## 2018-05-25 DIAGNOSIS — Z1231 Encounter for screening mammogram for malignant neoplasm of breast: Secondary | ICD-10-CM

## 2018-05-25 NOTE — Telephone Encounter (Signed)
Patient's daughter called and stated that she spoke with [employer] and they stated they have sent several request to our office for CPAP supplies and have not heard from [place]. She does not have their number. Please call and advise.

## 2018-05-25 NOTE — Telephone Encounter (Signed)
error 

## 2018-05-25 NOTE — Telephone Encounter (Addendum)
LVM for [employer] requesting a call back.  Sent community message.

## 2018-05-26 ENCOUNTER — Ambulatory Visit
Admission: RE | Admit: 2018-05-26 | Discharge: 2018-05-26 | Disposition: A | Discharge status: Home / Self Care | Payer: Medicare Other | Source: Ambulatory Visit | Attending: Internal Medicine | Admitting: Internal Medicine

## 2018-05-26 DIAGNOSIS — Z1231 Encounter for screening mammogram for malignant neoplasm of breast: Secondary | ICD-10-CM

## 2018-05-26 NOTE — Telephone Encounter (Signed)
Received this community message from [place]: Hey looks like the supply center is needing a DWO, and also looks like Marysa sent a DWO through [place] to y'all yesterday. We will get it all fixed for her.

## 2018-06-10 DIAGNOSIS — E782 Mixed hyperlipidemia: Secondary | ICD-10-CM | POA: Diagnosis not present

## 2018-06-10 DIAGNOSIS — E1165 Type 2 diabetes mellitus with hyperglycemia: Secondary | ICD-10-CM | POA: Diagnosis not present

## 2018-06-10 DIAGNOSIS — G4733 Obstructive sleep apnea (adult) (pediatric): Secondary | ICD-10-CM | POA: Diagnosis not present

## 2018-06-10 DIAGNOSIS — I10 Essential (primary) hypertension: Secondary | ICD-10-CM | POA: Diagnosis not present

## 2018-06-29 ENCOUNTER — Encounter: Payer: Self-pay | Admitting: Adult Health

## 2018-06-29 DIAGNOSIS — N760 Acute vaginitis: Secondary | ICD-10-CM | POA: Diagnosis not present

## 2018-06-30 ENCOUNTER — Ambulatory Visit (INDEPENDENT_AMBULATORY_CARE_PROVIDER_SITE_OTHER): Payer: Medicare Other | Admitting: Adult Health

## 2018-06-30 ENCOUNTER — Encounter: Payer: Self-pay | Admitting: Adult Health

## 2018-06-30 VITALS — BP 112/61 | HR 61 | Ht 65.0 in | Wt 170.6 lb

## 2018-06-30 DIAGNOSIS — G4733 Obstructive sleep apnea (adult) (pediatric): Secondary | ICD-10-CM

## 2018-06-30 DIAGNOSIS — Z9989 Dependence on other enabling machines and devices: Secondary | ICD-10-CM | POA: Diagnosis not present

## 2018-06-30 NOTE — Progress Notes (Signed)
Per TK at [facility], received order. sy

## 2018-06-30 NOTE — Progress Notes (Signed)
Community message sent to [employer], MB/TK for new cpap orders. 12/11 sy

## 2018-06-30 NOTE — Progress Notes (Addendum)
PATIENT: Casey Liu DOB: 1934/12/23  REASON FOR VISIT: follow up HISTORY FROM: patient  HISTORY OF PRESENT ILLNESS: Today 06/30/18:  Casey Liu is an 83 year old female with a history of obstructive sleep apnea on CPAP.  She returns today for follow-up.  Her download indicates that she used her machine 25 out of 30 days for compliance of 83%.  She used her machine greater than 4 hours 17 days for compliance of 57%.  On average she uses her machine 5 hours and 7 minutes.  Her residual AHI is 10.5 on 5 to 15 cm of water with EPR 3.  Her leak in the 95th percentile is 35.2 L/min.  Her average pressure in the 95th percentile is 12.1 cm of water.  The patient states that she is currently wearing a nasal mask.  She does have allergies and therefore sometimes she does not use it.  She states that her CPAP was also missing a piece but her DME company has replaced it.  She returns today for evaluation.  HISTORY 12/16/17 Casey Liu is an 83 year old female with a history of obstructive sleep apnea on CPAP.  She returns today for compliance download.  Her download indicates that she use her machine 27 out of 30 days for compliance of 90%. She used her machine 4 hours each night.  On average she uses her machine 8 hours and 3 minutes.  Her residual AHI is 10.4 on 9 cmH2O with EPR 3.  Her leak in the 95th percentile is 31.1 L/min.  The patient did have a mask refitting at the last visit.  She does not have any complaints with the CPAP.  She is here with her daughter who is translating.  She returns today for evaluation.   REVIEW OF SYSTEMS: Out of a complete 14 system review of symptoms, the patient complains only of the following symptoms, and all other reviewed systems are negative.  Epworth sleepiness score 6  Fatigue severity score 47  ALLERGIES: Allergies  Allergen Reactions  . Celebrex [Celecoxib] Nausea And Vomiting and Palpitations  . Latex Rash    HOME  MEDICATIONS: Outpatient Medications Prior to Visit  Medication Sig Dispense Refill  . atenolol (TENORMIN) 100 MG tablet Take 100 mg by mouth daily.    . bimatoprost (LUMIGAN) 0.01 % SOLN Lumigan 0.01 % eye drops    . brimonidine-timolol (COMBIGAN) 0.2-0.5 % ophthalmic solution Place 1 drop into both eyes every 12 (twelve) hours.    . cephALEXin (KEFLEX) 500 MG capsule Take 1 capsule (500 mg total) by mouth 3 (three) times daily. 15 capsule 0  . fluticasone (FLONASE) 50 MCG/ACT nasal spray Place 1 spray into both nostrils daily.     . metFORMIN (GLUCOPHAGE) 500 MG tablet Take 500-1,000 mg by mouth 2 (two) times daily with a meal. Take 500mg  every morning; take an additional 500 mg if sugar is >200    . NIFEdipine (ADALAT CC) 90 MG 24 hr tablet Take 90 mg by mouth daily.    Marland Kitchen olmesartan-hydrochlorothiazide (BENICAR HCT) 40-12.5 MG tablet Take 1 tablet by mouth daily.  1  . Polyethyl Glycol-Propyl Glycol (SYSTANE OP) Apply to eye.     No facility-administered medications prior to visit.     PAST MEDICAL HISTORY: Past Medical History:  Diagnosis Date  . Arthritis   . Cataract    right  . Diabetes mellitus without complication ([place])   . Family history of anesthesia complication    mother had "three heart attacks after  anesthesia"  . Headache(784.0)    hx of  . History of stomach ulcers   . Hypertension    Dr. Jonelle Sidle [phone]  . Pneumonia    hx of   . Seasonal allergies   . Sleep apnea    cpap  . Stroke [facility])    left side weakness  . Urinary tract infection    hx of    PAST SURGICAL HISTORY: Past Surgical History:  Procedure Laterality Date  . ABDOMINAL HYSTERECTOMY    . APPENDECTOMY    . BLADDER SURGERY    . CATARACT EXTRACTION W/PHACO  07/07/2012   Procedure: CATARACT EXTRACTION PHACO AND INTRAOCULAR LENS PLACEMENT (IOC);  Surgeon: Adonis Brook, MD;  Location: [place];  Service: Ophthalmology;  Laterality: Right;  . CESAREAN SECTION     x 1  . CHOLECYSTECTOMY    . EYE  SURGERY     "seven eye surgery"  . KNEE SURGERY     left knee  . OVARIAN CYST REMOVAL    . TOTAL KNEE ARTHROPLASTY Right 11/12/2015   Procedure: TOTAL KNEE ARTHROPLASTY;  Surgeon: Frederik Pear, MD;  Location: [place];  Service: Orthopedics;  Laterality: Right;  . TOTAL KNEE ARTHROPLASTY Left 04/23/2016   Procedure: LEFT TOTAL KNEE ARTHROPLASTY;  Surgeon: Frederik Pear, MD;  Location: [place];  Service: Orthopedics;  Laterality: Left;  . TUBAL LIGATION      FAMILY HISTORY: Family History  Problem Relation Age of Onset  . Breast cancer Daughter 60    SOCIAL HISTORY: Social History   Socioeconomic History  . Marital status: Single    Spouse name: Not on file  . Number of children: Not on file  . Years of education: Not on file  . Highest education level: Not on file  Occupational History  . Not on file  Social Needs  . Financial resource strain: Not on file  . Food insecurity:    Worry: Not on file    Inability: Not on file  . Transportation needs:    Medical: Not on file    Non-medical: Not on file  Tobacco Use  . Smoking status: Never Smoker  . Smokeless tobacco: Never Used  Substance and Sexual Activity  . Alcohol use: No    Comment: social hx   . Drug use: No  . Sexual activity: Not on file  Lifestyle  . Physical activity:    Days per week: Not on file    Minutes per session: Not on file  . Stress: Not on file  Relationships  . Social connections:    Talks on phone: Not on file    Gets together: Not on file    Attends religious service: Not on file    Active member of club or organization: Not on file    Attends meetings of clubs or organizations: Not on file    Relationship status: Not on file  . Intimate partner violence:    Fear of current or ex partner: Not on file    Emotionally abused: Not on file    Physically abused: Not on file    Forced sexual activity: Not on file  Other Topics Concern  . Not on file  Social History Narrative  . Not on file       PHYSICAL EXAM  Vitals:   06/30/18 1313  BP: 112/61  Pulse: 61  Weight: 170 lb 9.6 oz (77.4 kg)  Height: 5\' 5"  (1.651 m)   Body mass index is 28.39 kg/m.  Generalized: Well developed, in no acute distress   Neurological examination  Mentation: Alert oriented to time, place, history taking. Follows all commands speech and language fluent Cranial nerve II-XII: Extraocular movements were full, visual field were full on confrontational test. Facial sensation and strength were normal. Uvula tongue midline. Head turning and shoulder shrug  were normal and symmetric. Motor: The motor testing reveals 5 over 5 strength of all 4 extremities. Good symmetric motor tone is noted throughout.  Sensory: Sensory testing is intact to soft touch on all 4 extremities. No evidence of extinction is noted.  Gait and station: Gait is normal.    DIAGNOSTIC DATA (LABS, IMAGING, TESTING) - I reviewed patient records, labs, notes, testing and imaging myself where available.  Lab Results  Component Value Date   WBC 7.4 01/31/2018   HGB 13.9 01/31/2018   HCT 45.2 01/31/2018   MCV 94.4 01/31/2018   PLT 256 01/31/2018      Component Value Date/Time   NA 140 01/31/2018 1112   K 4.9 01/31/2018 1112   CL 103 01/31/2018 1112   CO2 25 01/31/2018 1112   GLUCOSE 109 (H) 01/31/2018 1112   BUN 32 (H) 01/31/2018 1112   CREATININE 0.97 03/18/2018 1708   CALCIUM 10.9 (H) 01/31/2018 1112   PROT 7.9 05/19/2012 1048   ALBUMIN 3.6 05/19/2012 1048   AST 14 05/19/2012 1048   ALT 7 05/19/2012 1048   ALKPHOS 106 05/19/2012 1048   BILITOT 0.3 05/19/2012 1048   GFRNONAA 54 (L) 03/18/2018 1708   GFRAA >60 03/18/2018 1708      ASSESSMENT AND PLAN 83 y.o. year old female  has a past medical history of Arthritis, Cataract, Diabetes mellitus without complication ([place]), Family history of anesthesia complication, KPTWSFKC(127.5), History of stomach ulcers, Hypertension, Pneumonia, Seasonal allergies, Sleep  apnea, Stroke ([place]), and Urinary tract infection. here with:  1.  Obstructive sleep apnea on CPAP  The patient CPAP download show suboptimal compliance.  Her residual AHI is elevated slightly however there is improvement since her original sleep study.  She will continue on the current pressure.  I will send to DME company for mask refitting.  She is advised that if her symptoms worsen or she develops new symptoms she should let us know.   I spent 15 minutes with the patient. 50% of this time was spent reviewing CPAP download   Ward Givens, MSN, NP-C 06/30/2018, 1:09 PM [facility] [address] [place], [place] [address] [phone]  I reviewed the above note and documentation by the Nurse Practitioner and agree with the history, physical exam, assessment and plan as outlined above. I was immediately available for face-to-face consultation. Star Age, MD, PhD [facility])

## 2018-06-30 NOTE — Patient Instructions (Signed)
Your Plan:  Continue using CPAP nightly and greater than 4 hours each night Mask refitting with DME company If your symptoms worsen or you develop new symptoms please let us know.   Thank you for coming to see [place] at [facility]. I hope we have been able to provide you high quality care today.  You may receive a patient satisfaction survey over the next few weeks. We would appreciate your feedback and comments so that we may continue to improve ourselves and the health of our patients.

## 2018-07-29 NOTE — Telephone Encounter (Signed)
Called daughter Mas[place], on [place] who stated when her mother uses the CPAP "it cuts off". She has not contacted [employer]. I gave her [employer]'s local number ab d advised she ask to speak with Lovena Le or to someone in the respiratory DME dept. She wrote down number, verbalized understanding, appreciation.

## 2018-07-29 NOTE — Telephone Encounter (Signed)
Patient's daughter is calling regarding questions about the patients CPAP stating that she has not been able to use it. She would like to speak with the nurse regarding this. Please call and advise.

## 2018-09-20 ENCOUNTER — Ambulatory Visit (HOSPITAL_COMMUNITY)
Admission: EM | Admit: 2018-09-20 | Discharge: 2018-09-20 | Disposition: A | Discharge status: Home / Self Care | Payer: Medicare Other | Attending: Family Medicine | Admitting: Family Medicine

## 2018-09-20 ENCOUNTER — Other Ambulatory Visit: Payer: Self-pay

## 2018-09-20 ENCOUNTER — Encounter (HOSPITAL_COMMUNITY): Payer: Self-pay

## 2018-09-20 ENCOUNTER — Ambulatory Visit (INDEPENDENT_AMBULATORY_CARE_PROVIDER_SITE_OTHER): Payer: Medicare Other

## 2018-09-20 DIAGNOSIS — J181 Lobar pneumonia, unspecified organism: Secondary | ICD-10-CM | POA: Diagnosis not present

## 2018-09-20 DIAGNOSIS — R05 Cough: Secondary | ICD-10-CM | POA: Diagnosis not present

## 2018-09-20 DIAGNOSIS — J189 Pneumonia, unspecified organism: Secondary | ICD-10-CM

## 2018-09-20 MED ORDER — AMOXICILLIN-POT CLAVULANATE 875-125 MG PO TABS: 1.0000 | ORAL_TABLET | Freq: Two times a day (BID) | ORAL | 0 refills | Status: AC

## 2018-09-20 MED ORDER — METHYLPREDNISOLONE ACETATE 40 MG/ML IJ SUSP
INTRAMUSCULAR | Status: AC
  Filled 2018-09-20: qty 1

## 2018-09-20 MED ORDER — AZITHROMYCIN 250 MG PO TABS: 250.0000 mg | ORAL_TABLET | Freq: Every day | ORAL | 0 refills | Status: DC

## 2018-09-20 MED ORDER — METHYLPREDNISOLONE ACETATE 40 MG/ML IJ SUSP
40.0000 mg | Freq: Once | INTRAMUSCULAR | Status: AC
  Administered 2018-09-20: 40 mg via INTRAMUSCULAR

## 2018-09-20 NOTE — Discharge Instructions (Signed)
Please try the antibiotics  Please follow up if your symptoms fail to improve.

## 2018-09-20 NOTE — ED Provider Notes (Signed)
[place]    CSN: [id number] Arrival date & time: 09/20/18  1635     History   Chief Complaint Chief Complaint  Patient presents with  . Cough  . Fever    HPI Casey Liu is a 84 y.o. female.   She is presenting with cough and fever.  Reports symptoms have been ongoing for 3 days.  Has had worsening of her symptoms.  No improvement with home modalities.  No history of asthma or COPD.  Denies any recent sick contacts.  No significant production with the cough.  HPI  Past Medical History:  Diagnosis Date  . Arthritis   . Cataract    right  . Diabetes mellitus without complication ([place])   . Family history of anesthesia complication    mother had "three heart attacks after anesthesia"  . Headache(784.0)    hx of  . History of stomach ulcers   . Hypertension    Dr. Jonelle Sidle [phone]  . Pneumonia    hx of   . Seasonal allergies   . Sleep apnea    cpap  . Stroke [facility])    left side weakness  . Urinary tract infection    hx of    Patient Active Problem List   Diagnosis Date Noted  . Primary osteoarthritis of left knee 04/23/2016  . Primary localized osteoarthritis of left knee 04/23/2016  . Primary osteoarthritis of right knee 11/11/2015  . 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 Vaginitis 08/16/2012    Past Surgical History:  Procedure Laterality Date  . ABDOMINAL HYSTERECTOMY    . APPENDECTOMY    . BLADDER SURGERY    . CATARACT EXTRACTION W/PHACO  07/07/2012   Procedure: CATARACT EXTRACTION PHACO AND INTRAOCULAR LENS PLACEMENT (IOC);  Surgeon: Adonis Brook, MD;  Location: [place];  Service: Ophthalmology;  Laterality: Right;  . CESAREAN SECTION     x 1  . CHOLECYSTECTOMY    . EYE SURGERY     "seven eye surgery"  . KNEE SURGERY     left knee  . OVARIAN CYST REMOVAL    . TOTAL KNEE ARTHROPLASTY Right 11/12/2015   Procedure: TOTAL KNEE ARTHROPLASTY;  Surgeon: Frederik Pear, MD;  Location: [place];  Service: Orthopedics;  Laterality:  Right;  . TOTAL KNEE ARTHROPLASTY Left 04/23/2016   Procedure: LEFT TOTAL KNEE ARTHROPLASTY;  Surgeon: Frederik Pear, MD;  Location: [place];  Service: Orthopedics;  Laterality: Left;  . TUBAL LIGATION      OB History   No obstetric history on file.      Home Medications    Prior to Admission medications   Medication Sig Start Date End Date Taking? Authorizing Provider  aspirin EC 81 MG tablet Take 81 mg by mouth daily.    [provider]  atenolol (TENORMIN) 100 MG tablet Take 100 mg by mouth daily.    [provider]  bimatoprost (LUMIGAN) 0.01 % SOLN Lumigan 0.01 % eye drops    [provider]  brimonidine-timolol (COMBIGAN) 0.2-0.5 % ophthalmic solution Place 1 drop into both eyes every 12 (twelve) hours.    [provider]  fluticasone (FLONASE) 50 MCG/ACT nasal spray Place 1 spray into both nostrils daily.     [provider]  hydrALAZINE (APRESOLINE) 25 MG tablet Take 25 mg by mouth 3 (three) times daily as needed. 06/10/18   [provider]  metFORMIN (GLUCOPHAGE) 500 MG tablet Take 500-1,000 mg by mouth 2 (two) times daily with a meal.  Take 500mg  every morning; take an additional 500 mg if sugar is >200    [provider]  NIFEdipine (ADALAT CC) 90 MG 24 hr tablet Take 90 mg by mouth daily.    [provider]  olmesartan-hydrochlorothiazide (BENICAR HCT) 40-12.5 MG tablet Take 1 tablet by mouth daily. 06/01/17   [provider]  Polyethyl Glycol-Propyl Glycol (SYSTANE OP) Apply to eye.    [provider]    Family History Family History  Problem Relation Age of Onset  . Breast cancer Daughter 1  . Healthy Mother   . Healthy Father     Social History Social History   Tobacco Use  . Smoking status: Never Smoker  . Smokeless tobacco: Never Used  Substance Use Topics  . Alcohol use: No    Comment: social hx   . Drug use: No     Allergies   Celebrex [celecoxib] and  Latex   Review of Systems Review of Systems  Constitutional: Positive for fever.  HENT: Negative for congestion.   Respiratory: Positive for cough.   Cardiovascular: Negative for leg swelling.  Gastrointestinal: Negative for abdominal distention.  Musculoskeletal: Negative for back pain.  Skin: Negative for color change.  Neurological: Negative for weakness.  Hematological: Negative for adenopathy.  Psychiatric/Behavioral: Negative for agitation.     Physical Exam Triage Vital Signs ED Triage Vitals  Enc Vitals Group     BP 09/20/18 1758 126/79     Pulse Rate 09/20/18 1757 90     Resp 09/20/18 1757 18     Temp 09/20/18 1757 (!) 100.4 F (38 C)     Temp src --      SpO2 09/20/18 1757 92 %     Weight --      Height --      Head Circumference --      Peak Flow --      Pain Score 09/20/18 1754 10     Pain Loc --      Pain Edu? --      Excl. in [place]? --    No data found.  Updated Vital Signs BP 126/79   Pulse 90   Temp (!) 100.4 F (38 C)   Resp 18   SpO2 92%   Visual Acuity Right Eye Distance:   Left Eye Distance:   Bilateral Distance:    Right Eye Near:   Left Eye Near:    Bilateral Near:     Physical Exam Gen: NAD, alert, cooperative with exam,  ENT: normal lips, normal nasal mucosa,  Eye: normal EOM, normal conjunctiva and lids CV:  no edema, +2 pedal pulses   Resp: no accessory muscle use, non-labored, crackles heard on the right side, some and expiratory wheezing Skin: no rashes, no areas of induration  Neuro: normal tone, normal sensation to touch Psych:  normal insight, alert and oriented MSK: normal gait, normal strength   UC Treatments / Results  Labs (all labs ordered are listed, but only abnormal results are displayed) Labs Reviewed - No data to display  EKG None  Radiology No results found.  Procedures Procedures (including critical care time)  Medications Ordered in UC Medications - No data to display  Initial Impression /  Assessment and Plan / UC Course  I have reviewed the triage vital signs and the nursing notes.  Pertinent labs & imaging results that were available during my care of the patient were reviewed by me and considered in my medical decision making (  see chart for details).     Casey Liu is an 84 year old female is presenting with cough.  Chest x-ray demonstrated a new right upper lobe pneumonia.  Will provide azithromycin and Augmentin.  Will provide IM Depo-Medrol.  Counseled on supportive care.  Given indications to follow-up.  Final Clinical Impressions(s) / UC Diagnoses   Final diagnoses:  None   Discharge Instructions   None    ED Prescriptions    None     Controlled Substance Prescriptions [place] Controlled Substance Registry consulted? Not Applicable   Rosemarie Ax, MD 09/20/18 Lurena Nida

## 2018-09-20 NOTE — ED Triage Notes (Signed)
Pt presents with complaints of cough and fever x 3 days.

## 2018-09-29 ENCOUNTER — Emergency Department (HOSPITAL_COMMUNITY): Payer: Medicare Other

## 2018-09-29 ENCOUNTER — Other Ambulatory Visit: Payer: Self-pay

## 2018-09-29 ENCOUNTER — Emergency Department (HOSPITAL_COMMUNITY)
Admission: EM | Admit: 2018-09-29 | Discharge: 2018-09-29 | Disposition: A | Discharge status: Home / Self Care | Payer: Medicare Other | Attending: Emergency Medicine | Admitting: Emergency Medicine

## 2018-09-29 ENCOUNTER — Encounter (HOSPITAL_COMMUNITY): Payer: Self-pay

## 2018-09-29 DIAGNOSIS — Z79899 Other long term (current) drug therapy: Secondary | ICD-10-CM | POA: Diagnosis not present

## 2018-09-29 DIAGNOSIS — E119 Type 2 diabetes mellitus without complications: Secondary | ICD-10-CM | POA: Diagnosis not present

## 2018-09-29 DIAGNOSIS — K5730 Diverticulosis of large intestine without perforation or abscess without bleeding: Secondary | ICD-10-CM | POA: Diagnosis not present

## 2018-09-29 DIAGNOSIS — Z96653 Presence of artificial knee joint, bilateral: Secondary | ICD-10-CM | POA: Diagnosis not present

## 2018-09-29 DIAGNOSIS — Z7982 Long term (current) use of aspirin: Secondary | ICD-10-CM | POA: Insufficient documentation

## 2018-09-29 DIAGNOSIS — R112 Nausea with vomiting, unspecified: Secondary | ICD-10-CM | POA: Insufficient documentation

## 2018-09-29 DIAGNOSIS — I10 Essential (primary) hypertension: Secondary | ICD-10-CM | POA: Insufficient documentation

## 2018-09-29 DIAGNOSIS — R001 Bradycardia, unspecified: Secondary | ICD-10-CM | POA: Diagnosis not present

## 2018-09-29 DIAGNOSIS — R531 Weakness: Secondary | ICD-10-CM | POA: Diagnosis not present

## 2018-09-29 DIAGNOSIS — R197 Diarrhea, unspecified: Secondary | ICD-10-CM | POA: Diagnosis not present

## 2018-09-29 DIAGNOSIS — Z7984 Long term (current) use of oral hypoglycemic drugs: Secondary | ICD-10-CM | POA: Insufficient documentation

## 2018-09-29 DIAGNOSIS — R1012 Left upper quadrant pain: Secondary | ICD-10-CM | POA: Insufficient documentation

## 2018-09-29 DIAGNOSIS — R0602 Shortness of breath: Secondary | ICD-10-CM | POA: Insufficient documentation

## 2018-09-29 LAB — CBC WITH DIFFERENTIAL/PLATELET
Abs Immature Granulocytes: 0.06 10*3/uL (ref 0.00–0.07)
Basophils Absolute: 0 10*3/uL (ref 0.0–0.1)
Basophils Relative: 1 %
Eosinophils Absolute: 0 10*3/uL (ref 0.0–0.5)
Eosinophils Relative: 0 %
HCT: 36.3 % (ref 36.0–46.0)
Hemoglobin: 11 g/dL — ABNORMAL LOW (ref 12.0–15.0)
Immature Granulocytes: 1 %
Lymphocytes Relative: 14 %
Lymphs Abs: 1.1 10*3/uL (ref 0.7–4.0)
MCH: 27.9 pg (ref 26.0–34.0)
MCHC: 30.3 g/dL (ref 30.0–36.0)
MCV: 92.1 fL (ref 80.0–100.0)
Monocytes Absolute: 0.4 10*3/uL (ref 0.1–1.0)
Monocytes Relative: 5 %
Neutro Abs: 5.9 10*3/uL (ref 1.7–7.7)
Neutrophils Relative %: 79 %
Platelets: 317 10*3/uL (ref 150–400)
RBC: 3.94 MIL/uL (ref 3.87–5.11)
RDW: 13.6 % (ref 11.5–15.5)
WBC: 7.5 10*3/uL (ref 4.0–10.5)
nRBC: 0 % (ref 0.0–0.2)

## 2018-09-29 LAB — INFLUENZA PANEL BY PCR (TYPE A & B)
Influenza A By PCR: NEGATIVE
Influenza B By PCR: NEGATIVE

## 2018-09-29 LAB — BRAIN NATRIURETIC PEPTIDE: B Natriuretic Peptide: 207.8 pg/mL — ABNORMAL HIGH (ref 0.0–100.0)

## 2018-09-29 LAB — HEPATIC FUNCTION PANEL
ALT: 16 U/L (ref 0–44)
AST: 20 U/L (ref 15–41)
Albumin: 3.3 g/dL — ABNORMAL LOW (ref 3.5–5.0)
Alkaline Phosphatase: 61 U/L (ref 38–126)
Bilirubin, Direct: <0.1 mg/dL (ref 0.0–0.2)
Total Bilirubin: 0.5 mg/dL (ref 0.3–1.2)
Total Protein: 6.8 g/dL (ref 6.5–8.1)

## 2018-09-29 LAB — BASIC METABOLIC PANEL
Anion gap: 10 (ref 5–15)
BUN: 25 mg/dL — ABNORMAL HIGH (ref 8–23)
CO2: 23 mmol/L (ref 22–32)
Calcium: 9.1 mg/dL (ref 8.9–10.3)
Chloride: 105 mmol/L (ref 98–111)
Creatinine, Ser: 1.15 mg/dL — ABNORMAL HIGH (ref 0.44–1.00)
GFR calc Af Amer: 52 mL/min — ABNORMAL LOW (ref >60)
GFR calc non Af Amer: 45 mL/min — ABNORMAL LOW (ref >60)
Glucose, Bld: 110 mg/dL — ABNORMAL HIGH (ref 70–99)
Potassium: 4 mmol/L (ref 3.5–5.1)
Sodium: 138 mmol/L (ref 135–145)

## 2018-09-29 LAB — LIPASE, BLOOD: Lipase: 18 U/L (ref 11–51)

## 2018-09-29 MED ORDER — ONDANSETRON HCL 4 MG/2ML IJ SOLN
4.0000 mg | Freq: Once | INTRAMUSCULAR | Status: AC
  Administered 2018-09-29: 4 mg via INTRAVENOUS
  Filled 2018-09-29: qty 2

## 2018-09-29 MED ORDER — ONDANSETRON 4 MG PO TBDP: 4.0000 mg | ORAL_TABLET | Freq: Three times a day (TID) | ORAL | 0 refills | Status: DC | PRN

## 2018-09-29 MED ORDER — SODIUM CHLORIDE 0.9 % IV BOLUS
1000.0000 mL | Freq: Once | INTRAVENOUS | Status: AC
  Administered 2018-09-29: 1000 mL via INTRAVENOUS

## 2018-09-29 MED ORDER — IOHEXOL 300 MG/ML  SOLN
100.0000 mL | Freq: Once | INTRAMUSCULAR | Status: AC | PRN
  Administered 2018-09-29: 100 mL via INTRAVENOUS

## 2018-09-29 NOTE — ED Provider Notes (Signed)
[facility] EMERGENCY DEPARTMENT Provider Note   CSN: [id number] Arrival date & time: 09/29/18  1143    History   Chief Complaint Chief Complaint  Patient presents with  . Emesis  . Shortness of Breath    HPI Casey Liu is a 84 y.o. female.     The history is provided by the patient and a relative. The history is limited by a language barrier. A language interpreter was used.  Emesis  Shortness of Breath  Associated symptoms: vomiting      84 year old Hispanic female with history of diabetes, hypertension, prior stroke presents ED for evaluation worsening of her pneumonia.  History obtained through family member who is at bedside.  For more than a week patient has had generalized body aches, decrease in appetite, persistent cough and not feeling well.  She was seen at her primary care office for her symptoms and was diagnosed with pneumonia.  She was placed on 2 different antibiotics.  She has been taking antibiotic which gives her diarrhea.  States that she has intermittent bouts of loose stools 4-5 episodes a day however the past few days diarrhea has since resolved.  States she was nearing the end of taking antibiotic when she developed abdominal pain with nausea and vomiting.  The symptom has not improved much since receiving treatment which concerned the family.  No recent travel.  No urinary symptoms.  No shortness of breath.  Past Medical History:  Diagnosis Date  . Arthritis   . Cataract    right  . Diabetes mellitus without complication ([place])   . Family history of anesthesia complication    mother had "three heart attacks after anesthesia"  . Headache(784.0)    hx of  . History of stomach ulcers   . Hypertension    Dr. Jonelle Sidle [phone]  . Pneumonia    hx of   . Seasonal allergies   . Sleep apnea    cpap  . Stroke [facility])    left side weakness  . Urinary tract infection    hx of    Patient Active Problem List   Diagnosis Date  Noted  . Primary osteoarthritis of left knee 04/23/2016  . Primary localized osteoarthritis of left knee 04/23/2016  . Primary osteoarthritis of right knee 11/11/2015  . 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 Vaginitis 08/16/2012    Past Surgical History:  Procedure Laterality Date  . ABDOMINAL HYSTERECTOMY    . APPENDECTOMY    . BLADDER SURGERY    . CATARACT EXTRACTION W/PHACO  07/07/2012   Procedure: CATARACT EXTRACTION PHACO AND INTRAOCULAR LENS PLACEMENT (IOC);  Surgeon: Adonis Brook, MD;  Location: [place];  Service: Ophthalmology;  Laterality: Right;  . CESAREAN SECTION     x 1  . CHOLECYSTECTOMY    . EYE SURGERY     "seven eye surgery"  . KNEE SURGERY     left knee  . OVARIAN CYST REMOVAL    . TOTAL KNEE ARTHROPLASTY Right 11/12/2015   Procedure: TOTAL KNEE ARTHROPLASTY;  Surgeon: Frederik Pear, MD;  Location: [place];  Service: Orthopedics;  Laterality: Right;  . TOTAL KNEE ARTHROPLASTY Left 04/23/2016   Procedure: LEFT TOTAL KNEE ARTHROPLASTY;  Surgeon: Frederik Pear, MD;  Location: [place];  Service: Orthopedics;  Laterality: Left;  . TUBAL LIGATION       OB History   No obstetric history on file.      Home Medications    Prior to Admission medications  Medication Sig Start Date End Date Taking? Authorizing Provider  amoxicillin-clavulanate (AUGMENTIN) 875-125 MG tablet Take 1 tablet by mouth 2 (two) times daily for 10 days. 09/20/18 09/30/18  Rosemarie Ax, MD  aspirin EC 81 MG tablet Take 81 mg by mouth daily.    [provider]  atenolol (TENORMIN) 100 MG tablet Take 100 mg by mouth daily.    [provider]  azithromycin (ZITHROMAX) 250 MG tablet Take 1 tablet (250 mg total) by mouth daily. Take first 2 tablets together, then 1 every day until finished. 09/20/18   Rosemarie Ax, MD  bimatoprost (LUMIGAN) 0.01 % SOLN Lumigan 0.01 % eye drops    [provider]  brimonidine-timolol (COMBIGAN) 0.2-0.5 % ophthalmic solution Place 1  drop into both eyes every 12 (twelve) hours.    [provider]  fluticasone (FLONASE) 50 MCG/ACT nasal spray Place 1 spray into both nostrils daily.     [provider]  hydrALAZINE (APRESOLINE) 25 MG tablet Take 25 mg by mouth 3 (three) times daily as needed. 06/10/18   [provider]  metFORMIN (GLUCOPHAGE) 500 MG tablet Take 500-1,000 mg by mouth 2 (two) times daily with a meal. Take 500mg  every morning; take an additional 500 mg if sugar is >200    [provider]  NIFEdipine (ADALAT CC) 90 MG 24 hr tablet Take 90 mg by mouth daily.    [provider]  olmesartan-hydrochlorothiazide (BENICAR HCT) 40-12.5 MG tablet Take 1 tablet by mouth daily. 06/01/17   [provider]  Polyethyl Glycol-Propyl Glycol (SYSTANE OP) Apply to eye.    [provider]    Family History Family History  Problem Relation Age of Onset  . Breast cancer Daughter 51  . Healthy Mother   . Healthy Father     Social History Social History   Tobacco Use  . Smoking status: Never Smoker  . Smokeless tobacco: Never Used  Substance Use Topics  . Alcohol use: No    Comment: social hx   . Drug use: No     Allergies   Celebrex [celecoxib] and Latex   Review of Systems Review of Systems  Respiratory: Positive for shortness of breath.   Gastrointestinal: Positive for vomiting.  All other systems reviewed and are negative.    Physical Exam Updated Vital Signs BP (!) 150/67   Pulse (!) 51   Temp 97.6 F (36.4 C) (Oral)   Resp 14   Ht 5\' 6"  (1.676 m)   Wt 72.6 kg   SpO2 95%   BMI 25.82 kg/m   Physical Exam Vitals signs and nursing note reviewed.  Constitutional:      General: She is not in acute distress.    Appearance: She is well-developed.  HENT:     Head: Atraumatic.  Eyes:     Conjunctiva/sclera: Conjunctivae normal.  Neck:     Musculoskeletal: Neck supple.  Cardiovascular:     Rate and Rhythm: Normal rate and regular  rhythm.  Pulmonary:     Effort: Pulmonary effort is normal.     Breath sounds: Decreased breath sounds present. No wheezing, rhonchi or rales.  Abdominal:     Palpations: Abdomen is soft.     Tenderness: There is abdominal tenderness (Tenderness to epigastric region on palpation and left upper quadrant.).  Musculoskeletal:     Right lower leg: No edema.     Left lower leg: No edema.  Skin:    Findings: No rash.  Neurological:  Mental Status: She is alert.      ED Treatments / Results  Labs (all labs ordered are listed, but only abnormal results are displayed) Labs Reviewed  BASIC METABOLIC PANEL - Abnormal; Notable for the following components:      Result Value   Glucose, Bld 110 (*)    BUN 25 (*)    Creatinine, Ser 1.15 (*)    GFR calc non Af Amer 45 (*)    GFR calc Af Amer 52 (*)    All other components within normal limits  CBC WITH DIFFERENTIAL/PLATELET - Abnormal; Notable for the following components:   Hemoglobin 11.0 (*)    All other components within normal limits  INFLUENZA PANEL BY PCR (TYPE A & B)  BRAIN NATRIURETIC PEPTIDE  LIPASE, BLOOD  HEPATIC FUNCTION PANEL    EKG EKG Interpretation  Date/Time:  Wednesday September 29 2018 11:58:11 EDT Ventricular Rate:  53 PR Interval:    QRS Duration: 109 QT Interval:  512 QTC Calculation: 481 R Axis:   7 Text Interpretation:  Sinus bradycardia Abnormal R-wave progression, early transition Confirmed by Veryl Speak [phone]) on 09/29/2018 12:37:41 PM   Radiology Dg Chest 2 View  Result Date: 09/29/2018 CLINICAL DATA:  Acute onset of shortness of breath and generalized weakness. EXAM: CHEST - 2 VIEW COMPARISON:  Chest radiograph performed 09/20/2018 FINDINGS: The lungs are mildly hypoexpanded but grossly clear. Peribronchial thickening is noted. Minimal right basilar atelectasis is seen. There is no evidence of pleural effusion or pneumothorax. The heart is borderline enlarged. No acute osseous abnormalities are  seen. IMPRESSION: Lungs mildly hypoexpanded but grossly clear. Peribronchial thickening noted. Borderline cardiomegaly. Electronically Signed   By: Garald Balding M.D.   On: 09/29/2018 13:26    Procedures Procedures (including critical care time)  EMERGENCY DEPARTMENT  US GUIDANCE EXAM Emergency Ultrasound:  US Guidance for Needle Guidance  INDICATIONS: Difficult vascular access Linear probe used in real-time to visualize location of needle entry through skin.   PERFORMED BY: Myself IMAGES ARCHIVED?: No LIMITATIONS: Pain VIEWS USED: Transverse INTERPRETATION: Right arm and Needle gauge 18  Medications Ordered in ED Medications  ondansetron (ZOFRAN) injection 4 mg (4 mg Intravenous Given 09/29/18 1335)  sodium chloride 0.9 % bolus 1,000 mL (1,000 mLs Intravenous New Bag/Given 09/29/18 1508)     Initial Impression / Assessment and Plan / ED Course  I have reviewed the triage vital signs and the nursing notes.  Pertinent labs & imaging results that were available during my care of the patient were reviewed by me and considered in my medical decision making (see chart for details).        BP (!) 150/67   Pulse (!) 51   Temp 97.6 F (36.4 C) (Oral)   Resp 14   Ht 5\' 6"  (1.676 m)   Wt 72.6 kg   SpO2 95%   BMI 25.82 kg/m    Final Clinical Impressions(s) / ED Diagnoses   Final diagnoses:  General weakness  Nausea vomiting and diarrhea    ED Discharge Orders    None     12:36 PM Patient recently was treated with pneumonia last week with 2 different types of antibiotic course of any symptoms has persisted and worsened.  Now she is having abdominal pain nausea and vomiting.  She also has some loose stools previously.  Work-up initiated.  2:31 PM Pt is dehydrated, multiple attempts of IV access without success by nursing staff.  Successful IV access by me using US guided.  Will give IVF.   3:18 PM Pt sign out to oncoming provider.  Pt initially here with what appears  to be unresolved pna.  However, CXR without focal infiltrates, normal WBC.  She is dehydrated and will need fluid hydration.  She did endorse diffused abd pain, which may be non specific but will need to be reassess to determine if she needs advance imaging.  Doubt c.diff as her diarrhea has stopped 2 days ago.     Domenic Moras, PA-C 09/29/18 1521    Veryl Speak, MD 09/30/18 2003

## 2018-09-29 NOTE — ED Notes (Signed)
This tech attempted to collect labwork from Pt. Unable to do so at this time. Phlebotomy and RN notified.

## 2018-09-29 NOTE — Discharge Instructions (Signed)
It was my pleasure taking care of you today!   Zofran as needed for nausea / vomiting.   Keep your appointment with your primary care doctor tomorrow.    Return to the emergency department for fevers, difficulty breathing, persistent vomiting controlled with nausea medication, new or worsening symptoms, any additional concerns.

## 2018-09-29 NOTE — ED Notes (Signed)
Pt discharged with family. Left in wheelchair. Verbalized understanding of dc instructions. No questions at this time. Translator used

## 2018-09-29 NOTE — ED Provider Notes (Signed)
Care assumed from previous provider PA Rona Ravens. Please see note for further details. Case discussed, plan agreed upon. Will follow up on pending labs and re-evaluate (including abdominal exam) after IV hydration.   Labs reviewed: Small bump in baseline creatinine with Cr today of 1.15. BNP of 207.8 - no values for comparison.   Patient re-evaluated. She denies any chest pain or shortness of breath. Denies any hx of CHF. Does report LE swelling for the last 3 months or so. Has a follow up appointment with her PCP tomorrow. Still reporting upper abdominal pain. She has tenderness across the upper abdomen. She reports that she has felt much better after nausea medication given and has not thrown up, but is concerned about why she was throwing up in the first place. Given her persistent abdominal tenderness, will obtain CT scan to further evaluate GI complaints.   CT reviewed: Moderate colonic stool retention without obstruction or inflammation.  No acute GU disease.  Patchy airspace opacities at bilateral lung bases concerning for pneumonia.  She is currently undergoing treatment for pneumonia. She was able to ambulate in ED and maintain sats >95% without any complaints of SOB.  No fevers.  On re-eval, patient states that she is not having any worsening of her respiratory symptoms.  She has a follow-up appointment with her PCP tomorrow. Evaluation does not show pathology that would require ongoing emergent intervention or inpatient treatment.  Reasons to return to the emergency department were discussed.  Importance of PCP follow-up discussed as well.  Patient discharged from ER in satisfactory condition and all questions were answered to the best my ability.   Wassim Kirksey, Ozella Almond, PA-C 09/29/18 2015    Davonna Belling, MD 09/29/18 [phone]

## 2018-09-29 NOTE — ED Triage Notes (Signed)
Pt was diagnosed with pneumonia last week. Pt still short of breath and has completed antibiotics. Today pt has had nausea and vomiting. Dtr concerned with decreased PO intake

## 2018-09-29 NOTE — ED Notes (Signed)
Tried to Draw patient blood in 2 different areas, But didn't have any success.

## 2018-09-29 NOTE — ED Notes (Signed)
PT ambulated hall on room air. Pt denies sob but states she feels congested. SPO2 stayed above 95% while ambulating

## 2018-09-30 DIAGNOSIS — E785 Hyperlipidemia, unspecified: Secondary | ICD-10-CM | POA: Diagnosis not present

## 2018-09-30 DIAGNOSIS — E119 Type 2 diabetes mellitus without complications: Secondary | ICD-10-CM | POA: Diagnosis not present

## 2018-09-30 DIAGNOSIS — K3184 Gastroparesis: Secondary | ICD-10-CM | POA: Diagnosis not present

## 2018-09-30 DIAGNOSIS — K219 Gastro-esophageal reflux disease without esophagitis: Secondary | ICD-10-CM | POA: Diagnosis not present

## 2018-10-16 IMAGING — CT CT ABD-PELV W/ CM
2 of 5 series · 15 of 46 positions shown, 17 images · IV contrast (APPLIED)
Comparison: CT 05/19/2012

CLINICAL DATA: Unexplained weight loss.

EXAM:
CT ABDOMEN AND PELVIS WITH CONTRAST
TECHNIQUE: Multidetector CT imaging of the abdomen and pelvis was performed
using the standard protocol following bolus administration of
intravenous contrast.
Please note images for this exam are under accession number
[id number].
CONTRAST:  100mL UZHHU2-DYY IOPAMIDOL (UZHHU2-DYY) INJECTION 61%

[Series 3: abd/ pelvis 5.0 i30f 2 · axial · 0.69mm/px · z∈[-784,-394]mm · 12 of 88 slices shown, 14 images]
[im 5/88  soft-tissue]
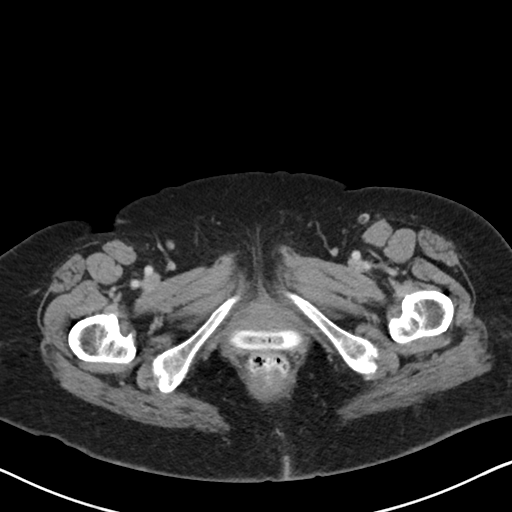
[im 5/88  bone]
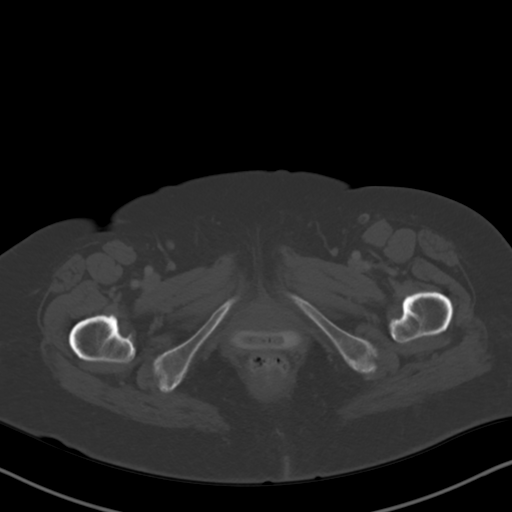
[im 14/88  soft-tissue]
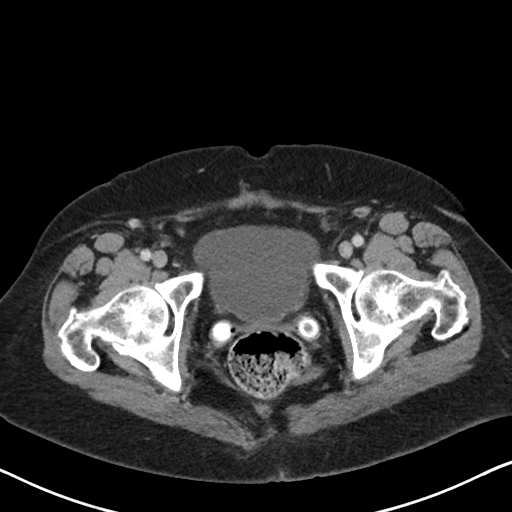
[im 18/88  soft-tissue]
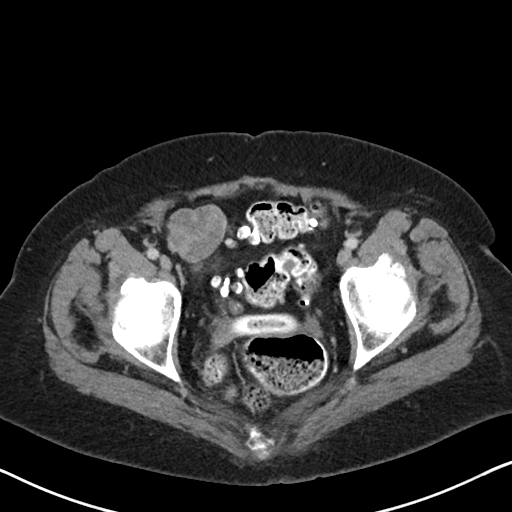
[im 27/88  soft-tissue]
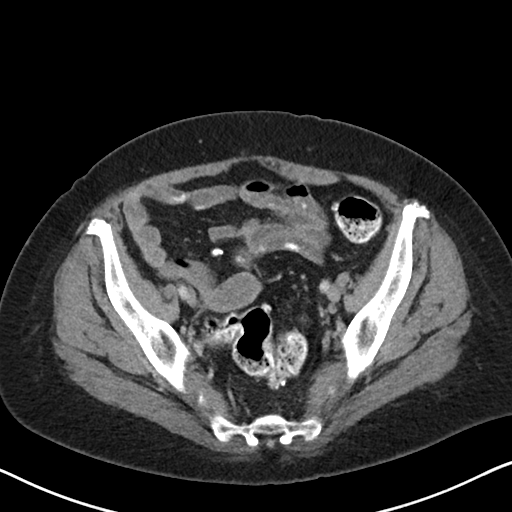
[im 35/88  soft-tissue]
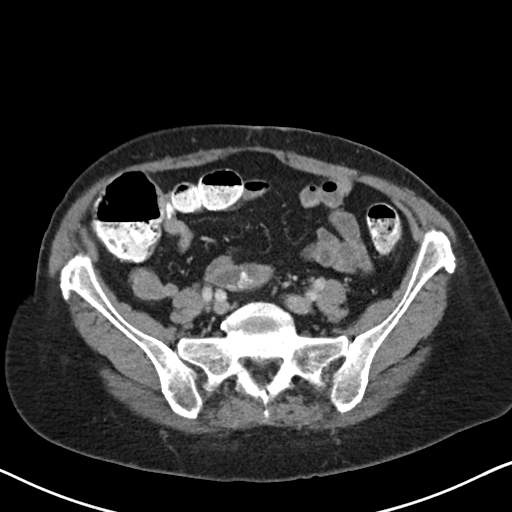
[im 40/88  soft-tissue]
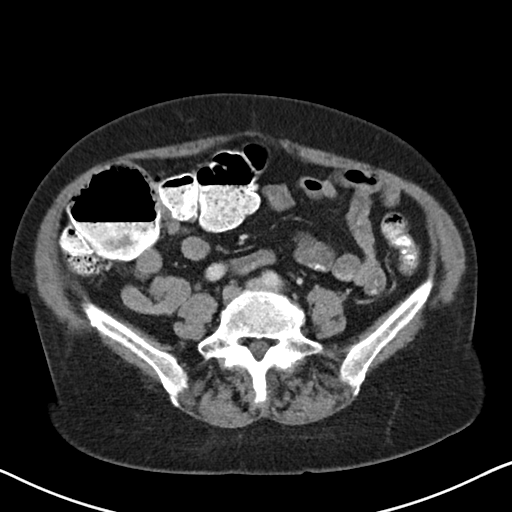
[im 48/88  soft-tissue]
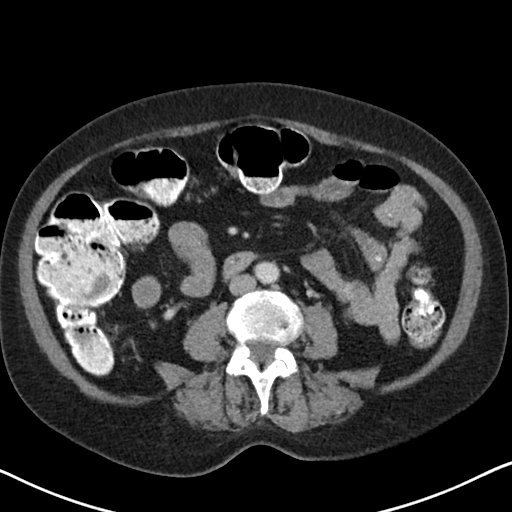
[im 53/88  soft-tissue]
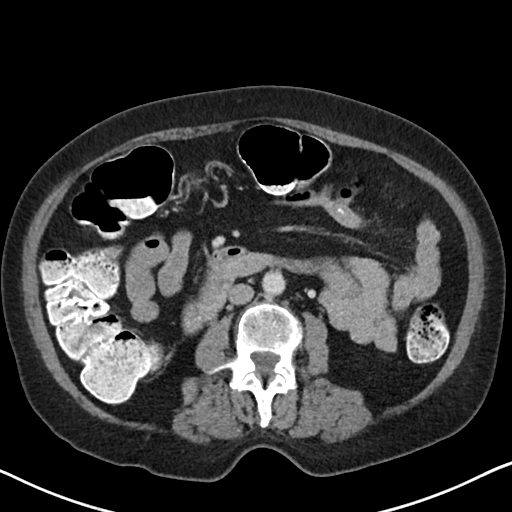
[im 61/88  soft-tissue]
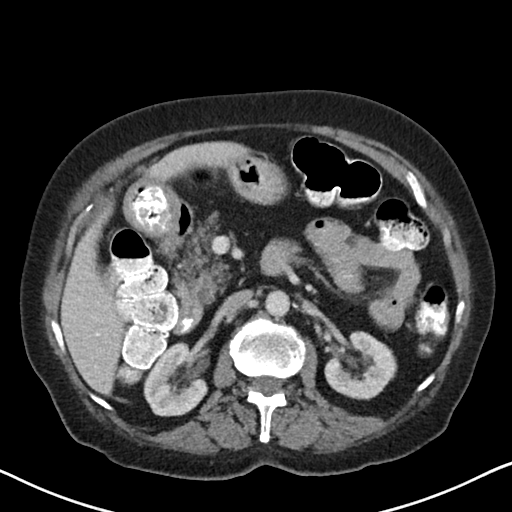
[im 61/88  bone]
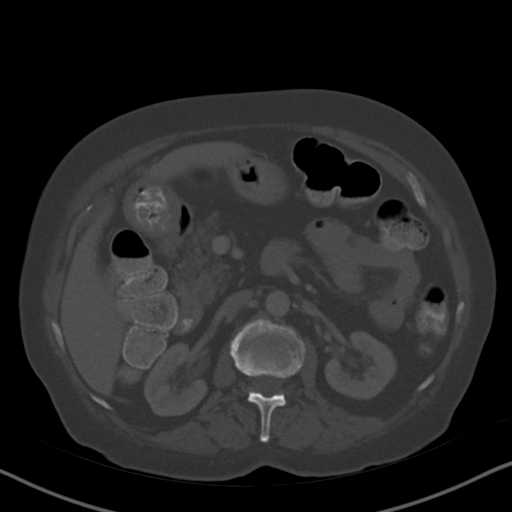
[im 70/88  soft-tissue]
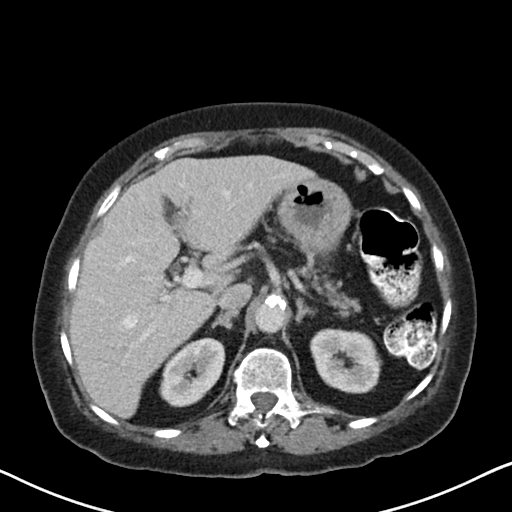
[im 74/88  soft-tissue]
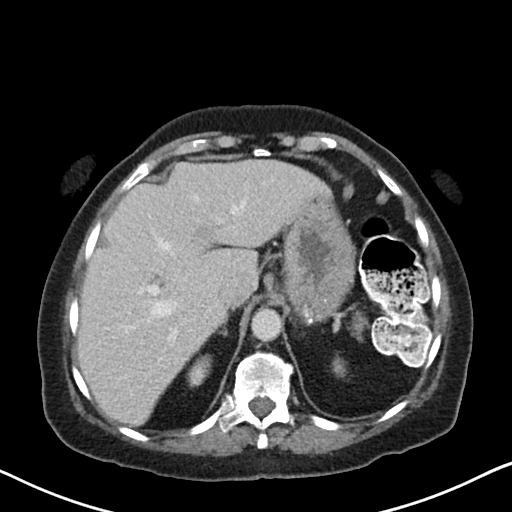
[im 83/88  soft-tissue]
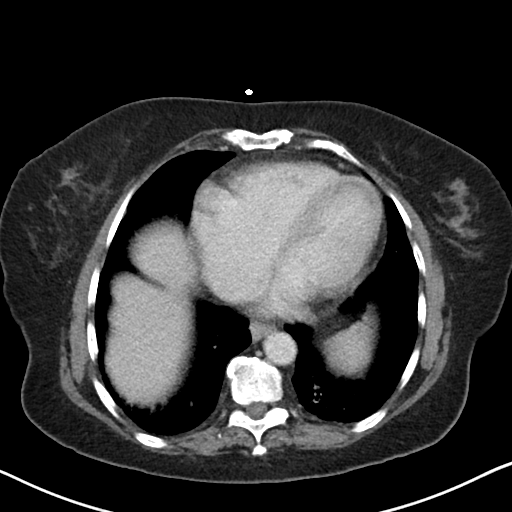

[Series 6: coronal soft tissue · coronal · 0.63mm/px · 3 of 101 slices shown]
[im 34/101  soft-tissue]
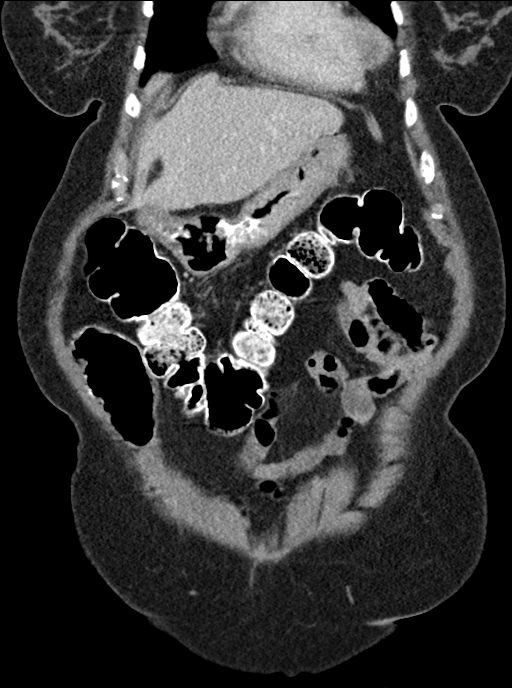
[im 45/101  soft-tissue]
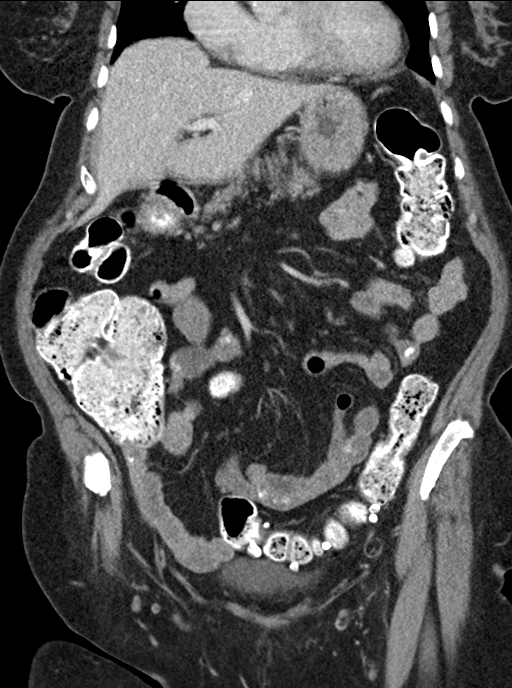
[im 56/101  soft-tissue]
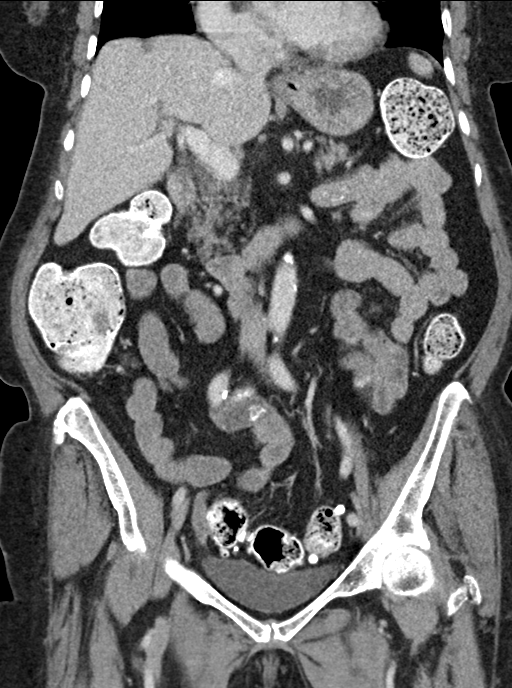

[15 of 46 positions shown; findings below may reference images not displayed]

FINDINGS: Lower chest: Breathing motion artifact partially obscures evaluation
of the lung bases. Mild cardiomegaly.

Hepatobiliary: No focal hepatic lesion. Postcholecystectomy. No
biliary dilatation.

Pancreas: Fatty atrophy. No evidence of focal lesion, inflammatory
change or ductal dilatation.

Spleen: Normal in size without focal abnormality.

Adrenals/Urinary Tract: Unchanged 7 mm fat density right adrenal
nodule consistent with myelolipoma. Left adrenal gland is normal. No
hydronephrosis or perinephric edema. Homogeneous renal enhancement
with symmetric excretion on delayed phase imaging. No focal renal
lesion. Urinary bladder is physiologically distended without wall
thickening.

Stomach/Bowel: Small hiatal hernia. Stomach is otherwise partially
distended, no definite gastric wall thickening allowing for degree
of distension. Moderate to advanced distal colonic diverticulosis
without diverticulitis. No colonic wall thickening or evidence of
colonic mass. Moderate stool burden in the colon. Small bowel is
unremarkable. Appendix not visual, prior appendectomy per history.

Vascular/Lymphatic: Aortic and branch atherosclerosis. No aneurysm.
Mesenteric vessels are patent. No enlarged abdominal or pelvic lymph
nodes.

Reproductive: Status post hysterectomy. No adnexal masses. Pessary
in place.

Other: No free air, free fluid, or intra-abdominal fluid collection.
No evidence of intra-abdominal mass.

Musculoskeletal: There are no acute or suspicious osseous
abnormalities.
IMPRESSION: 1. No acute findings or explanation for explained weight loss.
2. Colonic diverticulosis without diverticulitis. Small hiatal
hernia.
3.  Aortic Atherosclerosis ([id number]).

## 2018-11-22 ENCOUNTER — Other Ambulatory Visit: Payer: Self-pay

## 2018-11-22 MED ORDER — HYDRALAZINE HCL 25 MG PO TABS: 25.0000 mg | ORAL_TABLET | Freq: Three times a day (TID) | ORAL | 3 refills | Status: DC

## 2019-01-03 DIAGNOSIS — H401132 Primary open-angle glaucoma, bilateral, moderate stage: Secondary | ICD-10-CM | POA: Diagnosis not present

## 2019-01-03 DIAGNOSIS — Z961 Presence of intraocular lens: Secondary | ICD-10-CM | POA: Diagnosis not present

## 2019-01-03 DIAGNOSIS — E119 Type 2 diabetes mellitus without complications: Secondary | ICD-10-CM | POA: Diagnosis not present

## 2019-01-03 DIAGNOSIS — H04123 Dry eye syndrome of bilateral lacrimal glands: Secondary | ICD-10-CM | POA: Diagnosis not present

## 2019-01-03 DIAGNOSIS — H35371 Puckering of macula, right eye: Secondary | ICD-10-CM | POA: Diagnosis not present

## 2019-01-03 DIAGNOSIS — H20021 Recurrent acute iridocyclitis, right eye: Secondary | ICD-10-CM | POA: Diagnosis not present

## 2019-01-13 ENCOUNTER — Ambulatory Visit (INDEPENDENT_AMBULATORY_CARE_PROVIDER_SITE_OTHER): Payer: Medicare Other | Admitting: Cardiology

## 2019-01-13 ENCOUNTER — Other Ambulatory Visit: Payer: Self-pay

## 2019-01-13 ENCOUNTER — Encounter: Payer: Self-pay | Admitting: Cardiology

## 2019-01-13 VITALS — BP 201/91 | HR 60 | Ht 65.0 in | Wt 161.5 lb

## 2019-01-13 DIAGNOSIS — I129 Hypertensive chronic kidney disease with stage 1 through stage 4 chronic kidney disease, or unspecified chronic kidney disease: Secondary | ICD-10-CM

## 2019-01-13 DIAGNOSIS — I5032 Chronic diastolic (congestive) heart failure: Secondary | ICD-10-CM | POA: Diagnosis not present

## 2019-01-13 DIAGNOSIS — N183 Chronic kidney disease, stage 3 unspecified: Secondary | ICD-10-CM

## 2019-01-13 DIAGNOSIS — R0609 Other forms of dyspnea: Secondary | ICD-10-CM | POA: Diagnosis not present

## 2019-01-13 DIAGNOSIS — I10 Essential (primary) hypertension: Secondary | ICD-10-CM

## 2019-01-13 DIAGNOSIS — R0600 Dyspnea, unspecified: Secondary | ICD-10-CM

## 2019-01-13 MED ORDER — SPIRONOLACTONE 25 MG PO TABS: 25.0000 mg | ORAL_TABLET | Freq: Every day | ORAL | 3 refills | Status: DC

## 2019-01-13 NOTE — Patient Instructions (Signed)
  Please have blood work done in 2 weeks at [facility].

## 2019-01-13 NOTE — Progress Notes (Signed)
Primary Physician/Referring:  Elwyn Reach, MD  Patient ID: Casey Liu, female    DOB: 1935-04-25, 84 y.o.   MRN: [id number]  Chief Complaint  Patient presents with  . Hypertension   The interview was conducted with the help of [employer] audio and video with Rep [id number].  HPI: Casey Liu  is a 84 y.o. female  with  hypertension, obstructive sleep apnea on CPAP, type II diabetes mellitus, hyperlipidemia.  She has chronic dyspnea on exertion and very sensitive about her blood pressure changes.  I had last seen her in November 2019.  She now presents to the office to reevaluate her hypertension and also dyspnea.  States that recently over the past few weeks has noticed blood pressure to be much uncontrolled in spite of following and taking all her medications as directed.  She also states that she has been losing weight and regularly by decreasing her food intake.  She has had occasional palpitations as well especially at night but denies any chest pain, PND or orthopnea.  Denies any leg edema.  Past Medical History:  Diagnosis Date  . Arthritis   . Cataract    right  . Diabetes mellitus without complication ([place])   . Family history of anesthesia complication    mother had "three heart attacks after anesthesia"  . Headache(784.0)    hx of  . History of stomach ulcers   . Hypertension    Dr. Jonelle Sidle [phone]  . Pneumonia    hx of   . Seasonal allergies   . Sleep apnea    cpap  . Stroke [facility])    left side weakness  . Urinary tract infection    hx of    Past Surgical History:  Procedure Laterality Date  . ABDOMINAL HYSTERECTOMY    . APPENDECTOMY    . BLADDER SURGERY    . CATARACT EXTRACTION W/PHACO  07/07/2012   Procedure: CATARACT EXTRACTION PHACO AND INTRAOCULAR LENS PLACEMENT (IOC);  Surgeon: Adonis Brook, MD;  Location: [place];  Service: Ophthalmology;  Laterality: Right;  . CESAREAN SECTION     x 1  . CHOLECYSTECTOMY    . EYE SURGERY     "seven eye surgery"  . KNEE SURGERY     left knee  . OVARIAN CYST REMOVAL    . TOTAL KNEE ARTHROPLASTY Right 11/12/2015   Procedure: TOTAL KNEE ARTHROPLASTY;  Surgeon: Frederik Pear, MD;  Location: [place];  Service: Orthopedics;  Laterality: Right;  . TOTAL KNEE ARTHROPLASTY Left 04/23/2016   Procedure: LEFT TOTAL KNEE ARTHROPLASTY;  Surgeon: Frederik Pear, MD;  Location: [place];  Service: Orthopedics;  Laterality: Left;  . TUBAL LIGATION      Social History   Socioeconomic History  . Marital status: Single    Spouse name: Not on file  . Number of children: 7  . Years of education: Not on file  . Highest education level: Not on file  Occupational History  . Not on file  Social Needs  . Financial resource strain: Not on file  . Food insecurity    Worry: Not on file    Inability: Not on file  . Transportation needs    Medical: Not on file    Non-medical: Not on file  Tobacco Use  . Smoking status: Never Smoker  . Smokeless tobacco: Never Used  Substance and Sexual Activity  . Alcohol use: No    Comment: social hx   . Drug use: No  . Sexual activity: Not on  file  Lifestyle  . Physical activity    Days per week: Not on file    Minutes per session: Not on file  . Stress: Not on file  Relationships  . Social Herbalist on phone: Not on file    Gets together: Not on file    Attends religious service: Not on file    Active member of club or organization: Not on file    Attends meetings of clubs or organizations: Not on file    Relationship status: Not on file  . Intimate partner violence    Fear of current or ex partner: Not on file    Emotionally abused: Not on file    Physically abused: Not on file    Forced sexual activity: Not on file  Other Topics Concern  . Not on file  Social History Narrative  . Not on file    Review of Systems  Constitution: Negative for chills, decreased appetite, malaise/fatigue and weight gain.  Cardiovascular: Positive for dyspnea  on exertion (mild) and leg swelling (mild and in the evening). Negative for orthopnea and syncope.  Endocrine: Negative for cold intolerance.  Hematologic/Lymphatic: Does not bruise/bleed easily.  Musculoskeletal: Positive for joint pain. Negative for joint swelling.  Gastrointestinal: Negative for abdominal pain, anorexia, change in bowel habit, hematochezia and melena.  Neurological: Negative for headaches and light-headedness.  Psychiatric/Behavioral: Negative for depression and substance abuse.  All other systems reviewed and are negative.   Objective  Blood pressure (!) 201/91, pulse 60, height _0  (1.651 m), weight 161 lb 8 oz (73.3 kg), SpO2 95 %. Body mass index is 26.88 kg/m.    Physical Exam  Constitutional: She appears well-developed and well-nourished. No distress.  HENT:  Head: Atraumatic.  Eyes: Conjunctivae are normal.  Neck: Neck supple. No JVD present. No thyromegaly present.  Cardiovascular: Normal rate, regular rhythm, normal heart sounds and intact distal pulses. Exam reveals no gallop.  No murmur heard. Pulmonary/Chest: Effort normal and breath sounds normal.  Abdominal: Soft. Bowel sounds are normal.  Musculoskeletal: Normal range of motion.  Neurological: She is alert.  Skin: Skin is warm and dry.  Psychiatric: She has a normal mood and affect.   Radiology: No results found.  Laboratory examination:   CMP Latest Ref Rng & Units 09/29/2018 03/18/2018 01/31/2018  Glucose 70 - 99 mg/dL 110(H) - 109(H)  BUN 8 - 23 mg/dL 25(H) - 32(H)  Creatinine 0.44 - 1.00 mg/dL 1.15(H) 0.97 1.26(H)  Sodium 135 - 145 mmol/L 138 - 140  Potassium 3.5 - 5.1 mmol/L 4.0 - 4.9  Chloride 98 - 111 mmol/L 105 - 103  CO2 22 - 32 mmol/L 23 - 25  Calcium 8.9 - 10.3 mg/dL 9.1 - 10.9(H)  Total Protein 6.5 - 8.1 g/dL 6.8 - -  Total Bilirubin 0.3 - 1.2 mg/dL 0.5 - -  Alkaline Phos 38 - 126 U/L 61 - -  AST 15 - 41 U/L 20 - -  ALT 0 - 44 U/L 16 - -   CBC Latest Ref Rng & Units  09/29/2018 01/31/2018 04/25/2016  WBC 4.0 - 10.5 K/uL 7.5 7.4 10.0  Hemoglobin 12.0 - 15.0 g/dL 11.0(L) 13.9 10.3(L)  Hematocrit 36.0 - 46.0 % 36.3 45.2 33.0(L)  Platelets 150 - 400 K/uL 317 256 162   Lipid Panel  No results found for: CHOL, TRIG, HDL, CHOLHDL, VLDL, LDLCALC, LDLDIRECT HEMOGLOBIN A1C Lab Results  Component Value Date   HGBA1C 6.2 (H) 04/15/2016  MPG 131 04/15/2016   TSH No results for input(s): TSH in the last 8760 hours.   Medications   Medications Discontinued During This Encounter  Medication Reason  . ondansetron (ZOFRAN ODT) 4 MG disintegrating tablet Error  . olmesartan-hydrochlorothiazide (BENICAR HCT) 40-12.5 MG tablet Error  . metoCLOPramide (REGLAN) 5 MG tablet Error  . azithromycin (ZITHROMAX) 250 MG tablet Error   Current Meds  Medication Sig  . acetaminophen (TYLENOL) 325 MG tablet Take 650 mg by mouth every 6 (six) hours as needed for mild pain or headache.  Marland Kitchen atenolol (TENORMIN) 100 MG tablet Take 100 mg by mouth daily.  . bimatoprost (LUMIGAN) 0.01 % SOLN Place 1 drop into the left eye at bedtime.   . brimonidine-timolol (COMBIGAN) 0.2-0.5 % ophthalmic solution Place 1 drop into both eyes every 12 (twelve) hours.  . fluticasone (FLONASE) 50 MCG/ACT nasal spray Place 1 spray into both nostrils daily.   . furosemide (LASIX) 20 MG tablet Take 20 mg by mouth daily.  . hydrALAZINE (APRESOLINE) 25 MG tablet Take 1 tablet (25 mg total) by mouth 3 (three) times daily.  Marland Kitchen linaclotide (LINZESS) 290 MCG CAPS capsule Take 290 mcg by mouth daily before breakfast.  . metFORMIN (GLUCOPHAGE) 500 MG tablet Take 500-1,000 mg by mouth See admin instructions. Take 566m every morning; take an additional 500 mg if sugar is >200  . NIFEdipine (ADALAT CC) 90 MG 24 hr tablet Take 90 mg by mouth daily.  . nitroGLYCERIN (NITROSTAT) 0.4 MG SL tablet Place 0.4 mg under the tongue every 5 (five) minutes as needed for chest pain.    Cardiac Studies:   Lexiscan Sestamibi  02/07/11: Normal perfusion without ischemia. Normal LVEF.   Echocardiogram 07/08/2016: Left ventricle cavity is normal in size. Normal global wall motion. Doppler evidence of grade I (impaired) diastolic dysfunction. Calculated EF 61%. Left atrial cavity is moderately dilated at 4.5 cm. Interatrial septum bulges to the left suggests elevated Right heart pressure. Right atrial cavity is moderately dilated. Right ventricle cavity is mildly dilated. Normal right ventricular function. Mild (Grade I) mitral regurgitation. Moderate to severe tricuspid regurgitation. Moderate pulmonary hypertension. Pulmonary artery systolic pressure is estimated at 46 mm Hg. IVC is normal with poor inspiration collapse consistent with elevated right atrial pressure. Compared to the study done on 08/14/2011 and 02/05/2011, no significant change.  Assessment   Essential hypertension - Plan: spironolactone (ALDACTONE) 25 MG tablet, Basic metabolic panel,   Dyspnea on exertion -   Chronic diastolic (congestive) heart failure (HCC) -   CKD (chronic kidney disease) stage 3, GFR 30-59 ml/min (HCC)    EKG 03/10/2018: Sinus bradycardia 58 bpm. Normal axis. Normal conduction. Possible old inferior infarct. Low-voltage. No significant change.   Recommendations:   The interview was conducted with the help of Str[employer] audio and video with Rep [id number]. Patient with well-controlled diabetes, who presents for evaluation of difficult to control hypertension in the last few weeks.  I reviewed her labs from the prior emergency room visits, she has chronic diastolic heart failure with mildly elevated BNP and also mild stage III chronic kidney disease with EGFR 45/52 mL.  I have started her on Aldactone 25 mg every morning and will obtain a BMP in 10 days to 2 weeks.  I discussed with her regarding DASH diet and NSAIDs in Spanish printed out for her.  With regard to dyspnea, it is related to uncontrolled hypertension and chronic  diastolic heart failure she has continued to lose weight and previously was mildly  obese now her BMI is 26.88 in the past 6 months.  I am pleased with this.  I would like to see her back in 4 weeks for follow-up, I will also obtain an echocardiogram to evaluate her LV systolic function and pulmonary hypertension that was noted previously.   Adrian Prows, MD, [facility] 01/13/2019, 6:14 PM [facility] [place] Pager: [phone] Office: [phone] If no answer Cell [phone]

## 2019-02-05 ENCOUNTER — Encounter: Payer: Self-pay | Admitting: Neurology

## 2019-02-07 ENCOUNTER — Ambulatory Visit: Payer: Medicare Other | Admitting: Cardiology

## 2019-02-08 ENCOUNTER — Encounter: Payer: Self-pay | Admitting: Adult Health

## 2019-02-08 ENCOUNTER — Ambulatory Visit (INDEPENDENT_AMBULATORY_CARE_PROVIDER_SITE_OTHER): Payer: Medicare Other | Admitting: Adult Health

## 2019-02-08 ENCOUNTER — Other Ambulatory Visit: Payer: Self-pay

## 2019-02-08 VITALS — BP 142/76 | HR 62 | Temp 97.5°F | Ht 65.0 in | Wt 156.0 lb

## 2019-02-08 DIAGNOSIS — Z9989 Dependence on other enabling machines and devices: Secondary | ICD-10-CM | POA: Diagnosis not present

## 2019-02-08 DIAGNOSIS — G4733 Obstructive sleep apnea (adult) (pediatric): Secondary | ICD-10-CM

## 2019-02-08 NOTE — Progress Notes (Addendum)
PATIENT: Casey Liu DOB: 01/06/1935  REASON FOR VISIT: follow up HISTORY FROM: patient and daughter.  Daughter is the interpreter  HISTORY OF PRESENT ILLNESS: Today 02/08/19: Casey Liu is an 84 year old female with a history of obstructive sleep apnea on CPAP.  She returns today for follow-up.  Her CPAP download indicates that she used machine 27 out of 30 days for compliance of 90%.  She used her machine greater than 4 hours 13 days for compliance of 43%.  On average she uses her machine 3 hours and 37 minutes.  Her residual AHI is 2.8 on 5 to 15 cm of water with EPR 3.  Her leak in the 95th percentile is 34.2 L/min.  At the last visit she was sent for a mask refitting due to high leak.  Daughter reports that she did have a mask refitting.  She states that her mother is not complaining of the mask leaking.  She states that there are some nights that she will fall asleep watching television and not put the mask on until later.  HISTORY Casey Liu is an 84 year old female with a history of obstructive sleep apnea on CPAP.  She returns today for follow-up.  Her download indicates that she used her machine 25 out of 30 days for compliance of 83%.  She used her machine greater than 4 hours 17 days for compliance of 57%.  On average she uses her machine 5 hours and 7 minutes.  Her residual AHI is 10.5 on 5 to 15 cm of water with EPR 3.  Her leak in the 95th percentile is 35.2 L/min.  Her average pressure in the 95th percentile is 12.1 cm of water.  The patient states that she is currently wearing a nasal mask.  She does have allergies and therefore sometimes she does not use it.  She states that her CPAP was also missing a piece but her DME company has replaced it.  She returns today for evaluation.   REVIEW OF SYSTEMS: Out of a complete 14 system review of symptoms, the patient complains only of the following symptoms, and all other reviewed systems are negative.  See  HPI Fatigue severity score 45, Epworth sleepiness score 15  ALLERGIES: Allergies  Allergen Reactions  . Celebrex [Celecoxib] Nausea And Vomiting and Palpitations  . Latex Rash    HOME MEDICATIONS: Outpatient Medications Prior to Visit  Medication Sig Dispense Refill  . acetaminophen (TYLENOL) 325 MG tablet Take 650 mg by mouth every 6 (six) hours as needed for mild pain or headache.    Marland Kitchen atenolol (TENORMIN) 100 MG tablet Take 100 mg by mouth daily.    . bimatoprost (LUMIGAN) 0.01 % SOLN Place 1 drop into the left eye at bedtime.     . brimonidine-timolol (COMBIGAN) 0.2-0.5 % ophthalmic solution Place 1 drop into both eyes every 12 (twelve) hours.    . fluticasone (FLONASE) 50 MCG/ACT nasal spray Place 1 spray into both nostrils daily.     . furosemide (LASIX) 20 MG tablet Take 20 mg by mouth daily.    . hydrALAZINE (APRESOLINE) 25 MG tablet Take 1 tablet (25 mg total) by mouth 3 (three) times daily. 270 tablet 3  . linaclotide (LINZESS) 290 MCG CAPS capsule Take 290 mcg by mouth daily before breakfast.    . metFORMIN (GLUCOPHAGE) 500 MG tablet Take 500-1,000 mg by mouth See admin instructions. Take 500mg  every morning; take an additional 500 mg if sugar is >200    . NIFEdipine (  ADALAT CC) 90 MG 24 hr tablet Take 90 mg by mouth daily.    . nitroGLYCERIN (NITROSTAT) 0.4 MG SL tablet Place 0.4 mg under the tongue every 5 (five) minutes as needed for chest pain.    Marland Kitchen spironolactone (ALDACTONE) 25 MG tablet Take 1 tablet (25 mg total) by mouth daily. 30 tablet 3   No facility-administered medications prior to visit.     PAST MEDICAL HISTORY: Past Medical History:  Diagnosis Date  . Arthritis   . Cataract    right  . Diabetes mellitus without complication ([place])   . Family history of anesthesia complication    mother had "three heart attacks after anesthesia"  . Headache(784.0)    hx of  . History of stomach ulcers   . Hypertension    Dr. Jonelle Sidle [phone]  . Pneumonia    hx of    . Seasonal allergies   . Sleep apnea    cpap  . Stroke [facility])    left side weakness  . Urinary tract infection    hx of    PAST SURGICAL HISTORY: Past Surgical History:  Procedure Laterality Date  . ABDOMINAL HYSTERECTOMY    . APPENDECTOMY    . BLADDER SURGERY    . CATARACT EXTRACTION W/PHACO  07/07/2012   Procedure: CATARACT EXTRACTION PHACO AND INTRAOCULAR LENS PLACEMENT (IOC);  Surgeon: Adonis Brook, MD;  Location: [place];  Service: Ophthalmology;  Laterality: Right;  . CESAREAN SECTION     x 1  . CHOLECYSTECTOMY    . EYE SURGERY     "seven eye surgery"  . KNEE SURGERY     left knee  . OVARIAN CYST REMOVAL    . TOTAL KNEE ARTHROPLASTY Right 11/12/2015   Procedure: TOTAL KNEE ARTHROPLASTY;  Surgeon: Frederik Pear, MD;  Location: [place];  Service: Orthopedics;  Laterality: Right;  . TOTAL KNEE ARTHROPLASTY Left 04/23/2016   Procedure: LEFT TOTAL KNEE ARTHROPLASTY;  Surgeon: Frederik Pear, MD;  Location: [place];  Service: Orthopedics;  Laterality: Left;  . TUBAL LIGATION      FAMILY HISTORY: Family History  Problem Relation Age of Onset  . Breast cancer Daughter 62  . Healthy Mother   . Healthy Father     SOCIAL HISTORY: Social History   Socioeconomic History  . Marital status: Single    Spouse name: Not on file  . Number of children: 7  . Years of education: Not on file  . Highest education level: Not on file  Occupational History  . Not on file  Social Needs  . Financial resource strain: Not on file  . Food insecurity    Worry: Not on file    Inability: Not on file  . Transportation needs    Medical: Not on file    Non-medical: Not on file  Tobacco Use  . Smoking status: Never Smoker  . Smokeless tobacco: Never Used  Substance and Sexual Activity  . Alcohol use: No    Comment: social hx   . Drug use: No  . Sexual activity: Not on file  Lifestyle  . Physical activity    Days per week: Not on file    Minutes per session: Not on file  . Stress: Not on  file  Relationships  . Social Herbalist on phone: Not on file    Gets together: Not on file    Attends religious service: Not on file    Active member of club or organization: Not on  file    Attends meetings of clubs or organizations: Not on file    Relationship status: Not on file  . Intimate partner violence    Fear of current or ex partner: Not on file    Emotionally abused: Not on file    Physically abused: Not on file    Forced sexual activity: Not on file  Other Topics Concern  . Not on file  Social History Narrative  . Not on file      PHYSICAL EXAM  Vitals:   02/08/19 0857  BP: (!) 142/76  Pulse: 62  Temp: (!) 97.5 F (36.4 C)  Weight: 156 lb (70.8 kg)  Height: 5\' 5"  (1.651 m)   Body mass index is 25.96 kg/m.  Generalized: Well developed, in no acute distress Chest: Lungs clear to auscultation  Neurological examination  Mentation: Alert oriented to time, place, history taking. Follows all commands speech and language fluent Cranial nerve II-XII: Pupils were equal round reactive to light. Extraocular movements were full, visual field were full on confrontational test. . Head turning and shoulder shrug  were normal and symmetric. Motor: The motor testing reveals 5 over 5 strength of all 4 extremities. Good symmetric motor tone is noted throughout.  Sensory: Sensory testing is intact to soft touch on all 4 extremities. No evidence of extinction is noted.  Gait and station: Gait is normal. Reflexes: Deep tendon reflexes are symmetric and normal bilaterally.   DIAGNOSTIC DATA (LABS, IMAGING, TESTING) - I reviewed patient records, labs, notes, testing and imaging myself where available.  Lab Results  Component Value Date   WBC 7.5 09/29/2018   HGB 11.0 (L) 09/29/2018   HCT 36.3 09/29/2018   MCV 92.1 09/29/2018   PLT 317 09/29/2018      Component Value Date/Time   NA 138 09/29/2018 1433   K 4.0 09/29/2018 1433   CL 105 09/29/2018 1433   CO2  23 09/29/2018 1433   GLUCOSE 110 (H) 09/29/2018 1433   BUN 25 (H) 09/29/2018 1433   CREATININE 1.15 (H) 09/29/2018 1433   CALCIUM 9.1 09/29/2018 1433   PROT 6.8 09/29/2018 1433   ALBUMIN 3.3 (L) 09/29/2018 1433   AST 20 09/29/2018 1433   ALT 16 09/29/2018 1433   ALKPHOS 61 09/29/2018 1433   BILITOT 0.5 09/29/2018 1433   GFRNONAA 45 (L) 09/29/2018 1433   GFRAA 52 (L) 09/29/2018 1433      ASSESSMENT AND PLAN 84 y.o. year old female  has a past medical history of Arthritis, Cataract, Diabetes mellitus without complication ([place]), Family history of anesthesia complication, ZOXWRUEA(540.9), History of stomach ulcers, Hypertension, Pneumonia, Seasonal allergies, Sleep apnea, Stroke ([place]), and Urinary tract infection. here with:  1.  Obstructive sleep apnea on CPAP  Patient CPAP download shows suboptimal compliance and good treatment of her apnea.  She is encouraged to continue using CPAP nightly and greater than 4 hours each night.  She is advised that if her symptoms worsen or she develops new symptoms she should let us know.  She will follow-up in 1 year or sooner if needed.    I spent 15 minutes with the patient. 50% of this time was spent reviewing CPAP download   Ward Givens, MSN, NP-C 02/08/2019, 8:57 AM [facility] [address] [place],  [address] [phone]  I reviewed the above note and documentation by the Nurse Practitioner and agree with the history, exam, assessment and plan as outlined above. I was immediately available for consultation.  Star Age, MD, PhD [facility])

## 2019-02-08 NOTE — Patient Instructions (Signed)
Continue using CPAP nightly and greater than 4 hours each night °If your symptoms worsen or you develop new symptoms please let us know.  ° °

## 2019-02-16 DIAGNOSIS — I10 Essential (primary) hypertension: Secondary | ICD-10-CM | POA: Diagnosis not present

## 2019-02-17 LAB — BASIC METABOLIC PANEL
BUN/Creatinine Ratio: 26 (ref 12–28)
BUN: 30 mg/dL — ABNORMAL HIGH (ref 8–27)
CO2: 20 mmol/L (ref 20–29)
Calcium: 10.3 mg/dL (ref 8.7–10.3)
Chloride: 104 mmol/L (ref 96–106)
Creatinine, Ser: 1.15 mg/dL — ABNORMAL HIGH (ref 0.57–1.00)
GFR calc Af Amer: 52 mL/min/{1.73_m2} — ABNORMAL LOW (ref >59)
GFR calc non Af Amer: 45 mL/min/{1.73_m2} — ABNORMAL LOW (ref >59)
Glucose: 124 mg/dL — ABNORMAL HIGH (ref 65–99)
Potassium: 4.8 mmol/L (ref 3.5–5.2)
Sodium: 141 mmol/L (ref 134–144)

## 2019-02-23 ENCOUNTER — Other Ambulatory Visit: Payer: Self-pay

## 2019-02-23 ENCOUNTER — Ambulatory Visit (INDEPENDENT_AMBULATORY_CARE_PROVIDER_SITE_OTHER): Payer: Medicare Other

## 2019-02-23 DIAGNOSIS — I5032 Chronic diastolic (congestive) heart failure: Secondary | ICD-10-CM

## 2019-02-23 DIAGNOSIS — I10 Essential (primary) hypertension: Secondary | ICD-10-CM | POA: Diagnosis not present

## 2019-02-24 ENCOUNTER — Encounter: Payer: Self-pay | Admitting: Cardiology

## 2019-02-24 ENCOUNTER — Ambulatory Visit (INDEPENDENT_AMBULATORY_CARE_PROVIDER_SITE_OTHER): Payer: Medicare Other | Admitting: Cardiology

## 2019-02-24 VITALS — BP 139/75 | HR 60 | Temp 97.7°F | Ht 65.0 in | Wt 155.8 lb

## 2019-02-24 DIAGNOSIS — I10 Essential (primary) hypertension: Secondary | ICD-10-CM | POA: Diagnosis not present

## 2019-02-24 DIAGNOSIS — I071 Rheumatic tricuspid insufficiency: Secondary | ICD-10-CM

## 2019-02-24 MED ORDER — OLMESARTAN MEDOXOMIL-HCTZ 40-12.5 MG PO TABS: 1.0000 | ORAL_TABLET | Freq: Every day | ORAL | 1 refills | Status: DC

## 2019-02-24 MED ORDER — SPIRONOLACTONE 25 MG PO TABS: 25.0000 mg | ORAL_TABLET | Freq: Every day | ORAL | 1 refills | Status: DC

## 2019-02-24 NOTE — Progress Notes (Signed)
Primary Physician/Referring:  Elwyn Reach, MD  Patient ID: Casey Liu, female    DOB: Feb 13, 1935, 84 y.o.   MRN: [id number]  Chief Complaint  Patient presents with   Hypertension   Results    labs   Follow-up   HPI: Casey Liu  is a 84 y.o. female  with  hypertension, obstructive sleep apnea on CPAP, type II diabetes mellitus, hyperlipidemia.  She has chronic dyspnea on exertion and very sensitive about her blood pressure changes.     I seen her 2 months ago and added spironolactone, since then she has noticed improvement in blood pressure.  Continues to have mild chronic dyspnea, sharp chest pains occasionally, fatigue.  Past Medical History:  Diagnosis Date   Arthritis    Cataract    right   Diabetes mellitus without complication ([place])    Family history of anesthesia complication    mother had "three heart attacks after anesthesia"   Headache(784.0)    hx of   History of stomach ulcers    Hypertension    Dr. Jonelle Sidle [phone]   Pneumonia    hx of    Seasonal allergies    Sleep apnea    cpap   Stroke [facility])    left side weakness   Urinary tract infection    hx of    Past Surgical History:  Procedure Laterality Date   ABDOMINAL HYSTERECTOMY     APPENDECTOMY     BLADDER SURGERY     CATARACT EXTRACTION W/PHACO  07/07/2012   Procedure: CATARACT EXTRACTION PHACO AND INTRAOCULAR LENS PLACEMENT ([place]);  Surgeon: Adonis Brook, MD;  Location: [place];  Service: Ophthalmology;  Laterality: Right;   CESAREAN SECTION     x 1   CHOLECYSTECTOMY     EYE SURGERY     "seven eye surgery"   KNEE SURGERY     left knee   OVARIAN CYST REMOVAL     TOTAL KNEE ARTHROPLASTY Right 11/12/2015   Procedure: TOTAL KNEE ARTHROPLASTY;  Surgeon: Frederik Pear, MD;  Location: [place];  Service: Orthopedics;  Laterality: Right;   TOTAL KNEE ARTHROPLASTY Left 04/23/2016   Procedure: LEFT TOTAL KNEE ARTHROPLASTY;  Surgeon: Frederik Pear, MD;   Location: [place];  Service: Orthopedics;  Laterality: Left;   TUBAL LIGATION      Social History   Socioeconomic History   Marital status: Single    Spouse name: Not on file   Number of children: 8   Years of education: Not on file   Highest education level: Not on file  Occupational History   Not on file  Social Needs   Financial resource strain: Not on file   Food insecurity    Worry: Not on file    Inability: Not on file   Transportation needs    Medical: Not on file    Non-medical: Not on file  Tobacco Use   Smoking status: Never Smoker   Smokeless tobacco: Never Used  Substance and Sexual Activity   Alcohol use: No    Comment: social hx    Drug use: No   Sexual activity: Not on file  Lifestyle   Physical activity    Days per week: Not on file    Minutes per session: Not on file   Stress: Not on file  Relationships   Social connections    Talks on phone: Not on file    Gets together: Not on file    Attends religious service: Not on file  Active member of club or organization: Not on file    Attends meetings of clubs or organizations: Not on file    Relationship status: Not on file   Intimate partner violence    Fear of current or ex partner: Not on file    Emotionally abused: Not on file    Physically abused: Not on file    Forced sexual activity: Not on file  Other Topics Concern   Not on file  Social History Narrative   Not on file    Review of Systems  Constitution: Negative for chills, decreased appetite, malaise/fatigue and weight gain.  Cardiovascular: Positive for dyspnea on exertion (mild) and leg swelling (mild and in the evening). Negative for orthopnea and syncope.  Endocrine: Negative for cold intolerance.  Hematologic/Lymphatic: Does not bruise/bleed easily.  Musculoskeletal: Positive for joint pain. Negative for joint swelling.  Gastrointestinal: Negative for abdominal pain, anorexia, change in bowel habit,  hematochezia and melena.  Neurological: Negative for headaches and light-headedness.  Psychiatric/Behavioral: Negative for depression and substance abuse.  All other systems reviewed and are negative.   Objective  Blood pressure 139/75, pulse 60, temperature 97.7 F (36.5 C), height 5\' 5"  (1.651 m), weight 155 lb 12.8 oz (70.7 kg), SpO2 96 %. Body mass index is 25.93 kg/m.    Physical Exam  Constitutional: She appears well-developed and well-nourished. No distress.  HENT:  Head: Atraumatic.  Eyes: Conjunctivae are normal.  Neck: Neck supple. No JVD present. No thyromegaly present.  Cardiovascular: Normal rate, regular rhythm, normal heart sounds and intact distal pulses. Exam reveals no gallop.  No murmur heard. Pulmonary/Chest: Effort normal and breath sounds normal.  Abdominal: Soft. Bowel sounds are normal.  Musculoskeletal: Normal range of motion.  Neurological: She is alert.  Skin: Skin is warm and dry.  Psychiatric: She has a normal mood and affect.   Radiology: No results found.  Laboratory examination:   CMP Latest Ref Rng & Units 02/16/2019 09/29/2018 03/18/2018  Glucose 65 - 99 mg/dL 124(H) 110(H) -  BUN 8 - 27 mg/dL 30(H) 25(H) -  Creatinine 0.57 - 1.00 mg/dL 1.15(H) 1.15(H) 0.97  Sodium 134 - 144 mmol/L 141 138 -  Potassium 3.5 - 5.2 mmol/L 4.8 4.0 -  Chloride 96 - 106 mmol/L 104 105 -  CO2 20 - 29 mmol/L 20 23 -  Calcium 8.7 - 10.3 mg/dL 10.3 9.1 -  Total Protein 6.5 - 8.1 g/dL - 6.8 -  Total Bilirubin 0.3 - 1.2 mg/dL - 0.5 -  Alkaline Phos 38 - 126 U/L - 61 -  AST 15 - 41 U/L - 20 -  ALT 0 - 44 U/L - 16 -   CBC Latest Ref Rng & Units 09/29/2018 01/31/2018 04/25/2016  WBC 4.0 - 10.5 K/uL 7.5 7.4 10.0  Hemoglobin 12.0 - 15.0 g/dL 11.0(L) 13.9 10.3(L)  Hematocrit 36.0 - 46.0 % 36.3 45.2 33.0(L)  Platelets 150 - 400 K/uL 317 256 162   Lipid Panel  No results found for: CHOL, TRIG, HDL, CHOLHDL, VLDL, LDLCALC, LDLDIRECT HEMOGLOBIN A1C Lab Results  Component  Value Date   HGBA1C 6.2 (H) 04/15/2016   MPG 131 04/15/2016   TSH No results for input(s): TSH in the last 8760 hours.   Medications   Medications Discontinued During This Encounter  Medication Reason   spironolactone (ALDACTONE) 25 MG tablet Reorder   olmesartan-hydrochlorothiazide (BENICAR HCT) 40-12.5 MG tablet Reorder   Current Meds  Medication Sig   acetaminophen (TYLENOL) 325 MG tablet Take 650  mg by mouth every 6 (six) hours as needed for mild pain or headache.   aspirin EC 81 MG tablet Take 81 mg by mouth daily.   atenolol (TENORMIN) 100 MG tablet Take 100 mg by mouth daily.   bimatoprost (LUMIGAN) 0.01 % SOLN 1 drop at bedtime.   brimonidine-timolol (COMBIGAN) 0.2-0.5 % ophthalmic solution Place 1 drop into both eyes every 12 (twelve) hours.   fluticasone (FLONASE) 50 MCG/ACT nasal spray Place 1 spray into both nostrils daily.    hydrALAZINE (APRESOLINE) 25 MG tablet Take 1 tablet (25 mg total) by mouth 3 (three) times daily.   metFORMIN (GLUCOPHAGE) 500 MG tablet Take 500-1,000 mg by mouth See admin instructions. Take 500mg  every morning; take an additional 500 mg if sugar is >200   NIFEdipine (ADALAT CC) 90 MG 24 hr tablet Take 90 mg by mouth daily.   nitroGLYCERIN (NITROSTAT) 0.4 MG SL tablet Place 0.4 mg under the tongue every 5 (five) minutes as needed for chest pain.   olmesartan-hydrochlorothiazide (BENICAR HCT) 40-12.5 MG tablet Take 1 tablet by mouth daily.   Propylene Glycol (SYSTANE BALANCE OP) Apply to eye daily.   spironolactone (ALDACTONE) 25 MG tablet Take 1 tablet (25 mg total) by mouth daily.   [DISCONTINUED] olmesartan-hydrochlorothiazide (BENICAR HCT) 40-12.5 MG tablet Take 1 tablet by mouth daily.   [DISCONTINUED] spironolactone (ALDACTONE) 25 MG tablet Take 1 tablet (25 mg total) by mouth daily.    Cardiac Studies:   Lexiscan Sestamibi 02/07/11: Normal perfusion without ischemia. Normal LVEF.   Echocardiogram 02/23/2019:  Normal LV  systolic function with EF 54%. Left ventricle cavity is normal in size. Mild concentric hypertrophy of the left ventricle. Normal global wall motion. Doppler evidence of grade I (impaired) diastolic dysfunction, normal LAP. Calculated EF 54%. Left atrial cavity is mildly dilated at 3.9 cm in 4 chamber view. Structurally normal appearing mitral valve. Mild to moderate mitral regurgitation. Moderate tricuspid regurgitation. Mild  to moderate pulmonary hypertension. Estimated pulmonary artery systolic pressure is 41 mm Hg with RA pressure estimated at 3 mm Hg. RVSP measures 14mmHg. No significant change from  Echocardiogram 07/08/2016.  Assessment     ICD-10-CM   1. Essential hypertension  I10 olmesartan-hydrochlorothiazide (BENICAR HCT) 40-12.5 MG tablet    spironolactone (ALDACTONE) 25 MG tablet  2. Moderate tricuspid regurgitation  I07.1     EKG 03/10/2018: Sinus bradycardia 58 bpm. Normal axis. Normal conduction. Possible old inferior infarct. Low-voltage. No significant change.   Recommendations:   Patient presents to me for follow-up of hypertension, since being on spironolactone, blood pressure is very well controlled.  Her symptoms of shortness of breath and sharp chest pain are atypical but most and do not think there are angina pectoris related.  I simply reassured her. Her daughter is present at the bedside and all questions answered.   Although she has moderate pulmonary hypertension, there is no clinical evidence of congestive heart failure and there is no change in the echocardiogram from 2017.  In spite of mitral and tricuspid regurgitation, I do not appreciate a murmur. I have refilled her Benicar HCT and spironolactone.  This was a 25 minute office visit encounter.  I spent 15 minutes on a face-to-face encounter. Adrian Prows, MD, [facility] 02/27/2019, 7:27 PM [facility]. [place] Pager: [phone] Office: [phone] If no answer Cell [phone]

## 2019-03-18 DIAGNOSIS — Z4689 Encounter for fitting and adjustment of other specified devices: Secondary | ICD-10-CM | POA: Diagnosis not present

## 2019-03-20 DIAGNOSIS — H2512 Age-related nuclear cataract, left eye: Secondary | ICD-10-CM | POA: Diagnosis not present

## 2019-04-04 ENCOUNTER — Other Ambulatory Visit: Payer: Self-pay

## 2019-04-04 DIAGNOSIS — I10 Essential (primary) hypertension: Secondary | ICD-10-CM

## 2019-04-04 MED ORDER — HYDRALAZINE HCL 25 MG PO TABS: 25.0000 mg | ORAL_TABLET | Freq: Three times a day (TID) | ORAL | 3 refills | Status: DC

## 2019-04-25 ENCOUNTER — Other Ambulatory Visit: Payer: Self-pay | Admitting: Internal Medicine

## 2019-04-25 DIAGNOSIS — Z1231 Encounter for screening mammogram for malignant neoplasm of breast: Secondary | ICD-10-CM

## 2019-05-16 DIAGNOSIS — M1990 Unspecified osteoarthritis, unspecified site: Secondary | ICD-10-CM | POA: Diagnosis not present

## 2019-05-16 DIAGNOSIS — R634 Abnormal weight loss: Secondary | ICD-10-CM | POA: Diagnosis not present

## 2019-05-16 DIAGNOSIS — E119 Type 2 diabetes mellitus without complications: Secondary | ICD-10-CM | POA: Diagnosis not present

## 2019-05-16 DIAGNOSIS — I10 Essential (primary) hypertension: Secondary | ICD-10-CM | POA: Diagnosis not present

## 2019-05-31 ENCOUNTER — Ambulatory Visit
Admission: RE | Admit: 2019-05-31 | Discharge: 2019-05-31 | Disposition: A | Discharge status: Home / Self Care | Payer: Medicare Other | Source: Ambulatory Visit | Attending: Internal Medicine | Admitting: Internal Medicine

## 2019-05-31 ENCOUNTER — Other Ambulatory Visit: Payer: Self-pay

## 2019-05-31 DIAGNOSIS — Z1231 Encounter for screening mammogram for malignant neoplasm of breast: Secondary | ICD-10-CM

## 2019-07-29 ENCOUNTER — Telehealth: Payer: Self-pay

## 2019-07-29 DIAGNOSIS — I10 Essential (primary) hypertension: Secondary | ICD-10-CM

## 2019-07-29 MED ORDER — ATENOLOL 100 MG PO TABS: 100.0000 mg | ORAL_TABLET | Freq: Every day | ORAL | 1 refills | Status: DC

## 2019-07-29 MED ORDER — OLMESARTAN MEDOXOMIL-HCTZ 40-12.5 MG PO TABS: 1.0000 | ORAL_TABLET | Freq: Every day | ORAL | 1 refills | Status: DC

## 2019-07-29 MED ORDER — SPIRONOLACTONE 25 MG PO TABS: 25.0000 mg | ORAL_TABLET | Freq: Every day | ORAL | 1 refills | Status: DC

## 2019-07-29 NOTE — Telephone Encounter (Signed)
Agree 

## 2019-07-29 NOTE — Telephone Encounter (Signed)
Patient daughter requesting Benicar, Spironolactone and Atenolol be sent to pharmacy

## 2019-08-08 DIAGNOSIS — Z20822 Contact with and (suspected) exposure to covid-19: Secondary | ICD-10-CM | POA: Diagnosis not present

## 2019-08-17 ENCOUNTER — Ambulatory Visit: Payer: Self-pay | Admitting: *Deleted

## 2019-08-17 NOTE — Telephone Encounter (Addendum)
Pt has tested positive for covid last week and her daughter calling to report pt not doing well. She is coughing a lot, and she is very concerned for her. Would like advice on what to do.   Using [facility] # [phone], returned call to patient's daughter. No answer.  Called patient's daughter again using [place] # [phone], using a different number, no answer.  Called patient's daughter Educational psychologist # [phone].  Daughter stated that her mom tested positive and taking all the medications that the doctor prescribed. The patient does not have cough med prescribed according to the chart. She is giving her warm liquids and will notify her provider for cough medications. Stated the cough is a dry cough and she does not have a fever per daughter but she is checking it now. Her temp now is 96 and also checking her b/p now. It is 120/69 and not able to check her pulse. Advised to increase her fluids, use a humidifier, try a tablespoon of honey at bedtime but also notify her pcp regarding her symptoms. Also advised to call 911 if her mom has any respiratory distress.  She voiced understanding.

## 2019-08-22 ENCOUNTER — Ambulatory Visit: Payer: Medicare Other | Admitting: Cardiology

## 2019-09-18 ENCOUNTER — Emergency Department (HOSPITAL_COMMUNITY)
Admission: EM | Admit: 2019-09-18 | Discharge: 2019-09-18 | Disposition: A | Discharge status: Home / Self Care | Payer: Medicare Other | Attending: Emergency Medicine | Admitting: Emergency Medicine

## 2019-09-18 ENCOUNTER — Emergency Department (HOSPITAL_COMMUNITY): Payer: Medicare Other

## 2019-09-18 ENCOUNTER — Encounter (HOSPITAL_COMMUNITY): Payer: Self-pay

## 2019-09-18 DIAGNOSIS — M25551 Pain in right hip: Secondary | ICD-10-CM | POA: Insufficient documentation

## 2019-09-18 DIAGNOSIS — M25519 Pain in unspecified shoulder: Secondary | ICD-10-CM | POA: Diagnosis not present

## 2019-09-18 DIAGNOSIS — M25511 Pain in right shoulder: Secondary | ICD-10-CM | POA: Diagnosis not present

## 2019-09-18 DIAGNOSIS — M25552 Pain in left hip: Secondary | ICD-10-CM | POA: Diagnosis not present

## 2019-09-18 DIAGNOSIS — W1830XA Fall on same level, unspecified, initial encounter: Secondary | ICD-10-CM | POA: Diagnosis not present

## 2019-09-18 DIAGNOSIS — S4991XA Unspecified injury of right shoulder and upper arm, initial encounter: Secondary | ICD-10-CM | POA: Diagnosis not present

## 2019-09-18 DIAGNOSIS — W19XXXA Unspecified fall, initial encounter: Secondary | ICD-10-CM | POA: Diagnosis not present

## 2019-09-18 DIAGNOSIS — R296 Repeated falls: Secondary | ICD-10-CM | POA: Diagnosis not present

## 2019-09-18 DIAGNOSIS — R519 Headache, unspecified: Secondary | ICD-10-CM | POA: Insufficient documentation

## 2019-09-18 DIAGNOSIS — I10 Essential (primary) hypertension: Secondary | ICD-10-CM | POA: Diagnosis not present

## 2019-09-18 DIAGNOSIS — S0990XA Unspecified injury of head, initial encounter: Secondary | ICD-10-CM | POA: Diagnosis not present

## 2019-09-18 DIAGNOSIS — Y92013 Bedroom of single-family (private) house as the place of occurrence of the external cause: Secondary | ICD-10-CM | POA: Diagnosis not present

## 2019-09-18 DIAGNOSIS — S79911A Unspecified injury of right hip, initial encounter: Secondary | ICD-10-CM | POA: Diagnosis not present

## 2019-09-18 DIAGNOSIS — R42 Dizziness and giddiness: Secondary | ICD-10-CM | POA: Diagnosis not present

## 2019-09-18 DIAGNOSIS — R52 Pain, unspecified: Secondary | ICD-10-CM | POA: Diagnosis not present

## 2019-09-18 DIAGNOSIS — R609 Edema, unspecified: Secondary | ICD-10-CM | POA: Diagnosis not present

## 2019-09-18 LAB — CBC WITH DIFFERENTIAL/PLATELET
Abs Immature Granulocytes: 0.07 10*3/uL (ref 0.00–0.07)
Basophils Absolute: 0.1 10*3/uL (ref 0.0–0.1)
Basophils Relative: 1 %
Eosinophils Absolute: 0.2 10*3/uL (ref 0.0–0.5)
Eosinophils Relative: 2 %
HCT: 36.9 % (ref 36.0–46.0)
Hemoglobin: 11.6 g/dL — ABNORMAL LOW (ref 12.0–15.0)
Immature Granulocytes: 1 %
Lymphocytes Relative: 26 %
Lymphs Abs: 1.9 10*3/uL (ref 0.7–4.0)
MCH: 31.1 pg (ref 26.0–34.0)
MCHC: 31.4 g/dL (ref 30.0–36.0)
MCV: 98.9 fL (ref 80.0–100.0)
Monocytes Absolute: 0.6 10*3/uL (ref 0.1–1.0)
Monocytes Relative: 8 %
Neutro Abs: 4.6 10*3/uL (ref 1.7–7.7)
Neutrophils Relative %: 62 %
Platelets: 249 10*3/uL (ref 150–400)
RBC: 3.73 MIL/uL — ABNORMAL LOW (ref 3.87–5.11)
RDW: 15 % (ref 11.5–15.5)
WBC: 7.3 10*3/uL (ref 4.0–10.5)
nRBC: 0 % (ref 0.0–0.2)

## 2019-09-18 LAB — BASIC METABOLIC PANEL
Anion gap: 10 (ref 5–15)
BUN: 35 mg/dL — ABNORMAL HIGH (ref 8–23)
CO2: 21 mmol/L — ABNORMAL LOW (ref 22–32)
Calcium: 10.1 mg/dL (ref 8.9–10.3)
Chloride: 109 mmol/L (ref 98–111)
Creatinine, Ser: 1.11 mg/dL — ABNORMAL HIGH (ref 0.44–1.00)
GFR calc Af Amer: 54 mL/min — ABNORMAL LOW (ref >60)
GFR calc non Af Amer: 46 mL/min — ABNORMAL LOW (ref >60)
Glucose, Bld: 114 mg/dL — ABNORMAL HIGH (ref 70–99)
Potassium: 4.8 mmol/L (ref 3.5–5.1)
Sodium: 140 mmol/L (ref 135–145)

## 2019-09-18 LAB — URINALYSIS, ROUTINE W REFLEX MICROSCOPIC
Bacteria, UA: NONE SEEN
Bilirubin Urine: NEGATIVE
Glucose, UA: NEGATIVE mg/dL
Hgb urine dipstick: NEGATIVE
Ketones, ur: NEGATIVE mg/dL
Nitrite: NEGATIVE
Protein, ur: NEGATIVE mg/dL
Specific Gravity, Urine: 1.015 (ref 1.005–1.030)
pH: 7 (ref 5.0–8.0)

## 2019-09-18 MED ORDER — ACETAMINOPHEN 325 MG PO TABS
650.0000 mg | ORAL_TABLET | Freq: Once | ORAL | Status: AC
  Administered 2019-09-18: 650 mg via ORAL
  Filled 2019-09-18: qty 2

## 2019-09-18 NOTE — ED Notes (Signed)
Per patient, they are complaining of Right Hip Pain and denies left hip pain.

## 2019-09-18 NOTE — ED Notes (Signed)
Returns from MRI

## 2019-09-18 NOTE — ED Provider Notes (Signed)
[facility] EMERGENCY DEPARTMENT Provider Note   CSN: [id number] Arrival date & time: 09/18/19  [id number]     History Chief Complaint  Patient presents with  . Fall    Casey Liu is a 85 y.o. female.  HPI     This is an 84 year old female with a history of diabetes, hypertension, stroke who presents following a fall.  Patient reports that she got out of bed and felt dizzy.  She fell.  She states that it was room spinning dizziness.  She reports recent history of similar symptoms.  She reports hitting her head but no loss of consciousness.  Denies anticoagulant use.  Complains of headache, right shoulder pain, bilateral hip pain.  She rates her pain at 5 out of 10.  She denies any chest pain, shortness of breath, recent fevers.  Per EMS, family stated that she has had more frequent falls recently.  Past Medical History:  Diagnosis Date  . Arthritis   . Cataract    right  . Diabetes mellitus without complication ([place])   . Family history of anesthesia complication    mother had "three heart attacks after anesthesia"  . Headache(784.0)    hx of  . History of stomach ulcers   . Hypertension    Dr. Jonelle Sidle [phone]  . Pneumonia    hx of   . Seasonal allergies   . Sleep apnea    cpap  . Stroke [facility])    left side weakness  . Urinary tract infection    hx of    Patient Active Problem List   Diagnosis Date Noted  . Primary osteoarthritis of left knee 04/23/2016  . Primary localized osteoarthritis of left knee 04/23/2016  . Primary osteoarthritis of right knee 11/11/2015  . 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 Vaginitis 08/16/2012    Past Surgical History:  Procedure Laterality Date  . ABDOMINAL HYSTERECTOMY    . APPENDECTOMY    . BLADDER SURGERY    . CATARACT EXTRACTION W/PHACO  07/07/2012   Procedure: CATARACT EXTRACTION PHACO AND INTRAOCULAR LENS PLACEMENT ([facility]);  Surgeon: Adonis Brook, MD;  Location: [place];  Service:  Ophthalmology;  Laterality: Right;  . CESAREAN SECTION     x 1  . CHOLECYSTECTOMY    . EYE SURGERY     "seven eye surgery"  . KNEE SURGERY     left knee  . OVARIAN CYST REMOVAL    . TOTAL KNEE ARTHROPLASTY Right 11/12/2015   Procedure: TOTAL KNEE ARTHROPLASTY;  Surgeon: Frederik Pear, MD;  Location: [place];  Service: Orthopedics;  Laterality: Right;  . TOTAL KNEE ARTHROPLASTY Left 04/23/2016   Procedure: LEFT TOTAL KNEE ARTHROPLASTY;  Surgeon: Frederik Pear, MD;  Location: [place];  Service: Orthopedics;  Laterality: Left;  . TUBAL LIGATION       OB History   No obstetric history on file.     Family History  Problem Relation Age of Onset  . Breast cancer Daughter 33  . Healthy Mother   . Healthy Father     Social History   Tobacco Use  . Smoking status: Never Smoker  . Smokeless tobacco: Never Used  Substance Use Topics  . Alcohol use: No    Comment: social hx   . Drug use: No    Home Medications Prior to Admission medications   Medication Sig Start Date End Date Taking? Authorizing Provider  acetaminophen (TYLENOL) 325 MG tablet Take 650 mg by mouth every 6 (six)  hours as needed for mild pain or headache.    [provider]  aspirin EC 81 MG tablet Take 81 mg by mouth daily.    [provider]  atenolol (TENORMIN) 100 MG tablet Take 1 tablet (100 mg total) by mouth daily. 07/29/19   Adrian Prows, MD  bimatoprost (LUMIGAN) 0.01 % SOLN 1 drop at bedtime.    [provider]  brimonidine-timolol (COMBIGAN) 0.2-0.5 % ophthalmic solution Place 1 drop into both eyes every 12 (twelve) hours.    [provider]  fluticasone (FLONASE) 50 MCG/ACT nasal spray Place 1 spray into both nostrils daily.     [provider]  hydrALAZINE (APRESOLINE) 25 MG tablet Take 1 tablet (25 mg total) by mouth 3 (three) times daily. 04/04/19 07/03/19  Patwardhan, Reynold Bowen, MD  metFORMIN (GLUCOPHAGE) 500 MG tablet Take 500-1,000 mg by mouth See admin instructions.  Take 500mg  every morning; take an additional 500 mg if sugar is >200    [provider]  NIFEdipine (ADALAT CC) 90 MG 24 hr tablet Take 90 mg by mouth daily.    [provider]  nitroGLYCERIN (NITROSTAT) 0.4 MG SL tablet Place 0.4 mg under the tongue every 5 (five) minutes as needed for chest pain.    [provider]  olmesartan-hydrochlorothiazide (BENICAR HCT) 40-12.5 MG tablet Take 1 tablet by mouth daily. 07/29/19   Adrian Prows, MD  Propylene Glycol (SYSTANE BALANCE OP) Apply to eye daily.    [provider]  spironolactone (ALDACTONE) 25 MG tablet Take 1 tablet (25 mg total) by mouth daily. 07/29/19   Adrian Prows, MD    Allergies    Celebrex [celecoxib] and Latex  Review of Systems   Review of Systems  Constitutional: Negative for fever.  Respiratory: Negative for shortness of breath.   Cardiovascular: Negative for chest pain.  Gastrointestinal: Negative for abdominal pain, nausea and vomiting.  Genitourinary: Negative for dysuria.  Musculoskeletal:       Right shoulder pain, hip pain  Neurological: Positive for dizziness and headaches.  All other systems reviewed and are negative.   Physical Exam Updated Vital Signs BP 129/67 (BP Location: Right Arm)   Pulse 64   Temp 97.8 F (36.6 C) (Oral)   Resp 18   SpO2 99%   Physical Exam Vitals and nursing note reviewed.  Constitutional:      Appearance: She is well-developed. She is not ill-appearing.     Comments: Elderly, nontoxic appearing  HENT:     Head: Normocephalic and atraumatic.     Mouth/Throat:     Mouth: Mucous membranes are moist.  Eyes:     Pupils: Pupils are equal, round, and reactive to light.  Neck:     Comments: C-collar in place Cardiovascular:     Rate and Rhythm: Normal rate and regular rhythm.     Heart sounds: Normal heart sounds.  Pulmonary:     Effort: Pulmonary effort is normal. No respiratory distress.     Breath sounds: No wheezing.  Abdominal:     General:  Bowel sounds are normal.     Palpations: Abdomen is soft.     Tenderness: There is no abdominal tenderness.  Musculoskeletal:     Cervical back: Neck supple.     Comments: Tenderness to palpation and decreased range of motion of the right shoulder, no obvious deformity, pain with range of motion of the right hip, no obvious deformity or foreshortening, neurovascular intact distally, no pain with range of motion of  the left hip  Skin:    General: Skin is warm and dry.  Neurological:     Mental Status: She is alert and oriented to person, place, and time.     Comments: Moves all 4 extremities equally, limited exam secondary to spinal precautions and c-collar placement, no obvious dysmetria  Psychiatric:        Mood and Affect: Mood normal.     ED Results / Procedures / Treatments   Labs (all labs ordered are listed, but only abnormal results are displayed) Labs Reviewed  CBC WITH DIFFERENTIAL/PLATELET  BASIC METABOLIC PANEL  URINALYSIS, ROUTINE W REFLEX MICROSCOPIC    EKG EKG Interpretation  Date/Time:  Sunday September 18 2019 05:35:16 EST Ventricular Rate:  137 PR Interval:    QRS Duration: 75 QT Interval:  264 QTC Calculation: 362 R Axis:   4 Text Interpretation: Normal sinus rhythm Paired ventricular premature complexes Borderline low voltage, extremity leads Abnormal R-wave progression, early transition No significant change since last tracing Confirmed by Thayer Jew [id number]) on 09/18/2019 5:49:36 AM   Radiology No results found.  Procedures Procedures (including critical care time)  Medications Ordered in ED Medications - No data to display  ED Course  I have reviewed the triage vital signs and the nursing notes.  Pertinent labs & imaging results that were available during my care of the patient were reviewed by me and considered in my medical decision making (see chart for details).    MDM Rules/Calculators/A&P                       Patient presents  following a fall.  Reports ongoing vertiginous symptoms.  She is overall nontoxic and vital signs are reassuring.  She has hip pain and right shoulder pain.  Her neurologic exam is limited because she is in a c-collar and on spinal precautions but there is no obvious deficit.  Will obtain x-rays, CT to rule out traumatic injury.  Lab work also for evaluation of vertigo.  Patient may need MRI for full evaluation to rule out central cause of vertigo.  Final Clinical Impression(s) / ED Diagnoses Final diagnoses:  None    Rx / DC Orders ED Discharge Orders    None       Icesis Renn, Barbette Hair, MD 09/18/19 2349

## 2019-09-18 NOTE — Progress Notes (Signed)
Attempted to reach RN 3x for MRI.

## 2019-09-18 NOTE — ED Notes (Signed)
Pt could not stand for orthostatic vts

## 2019-09-18 NOTE — Discharge Instructions (Addendum)
Follow up with your doctor to discuss physical therapy and to consider further evaluation such as checking your thyroid levels.     Take over the counter medications as needed.

## 2019-09-18 NOTE — ED Notes (Signed)
With interpreter pt reports she got dizzy prior to the fall, tried to hold herself up with her dresser but could not last.  Has pain to R shoulder, R hip and c- spine.  Has hx of prolapsed bladder and possibly had pessary which is no longer there after the fall and has increased urination since.  Log rolled to change linens and adjust pure wick.  Pt tolerated well does report her pain at a 8.

## 2019-09-18 NOTE — ED Notes (Signed)
Patient's daughter Dillyn left her phone number and is requesting update on the patient [phone]

## 2019-09-18 NOTE — ED Provider Notes (Signed)
Patient's ED work-up is reassuring.  Laboratory tests without significant abnormalities.  Urinalysis without signs of infection.  X-rays did not show any signs of acute injury.  Patient also had an MRI of the brain that did not show any signs of stroke.  There was some questionable pituitary enlargement.  I discussed this finding with the patient's daughter.  I did discuss possibly doing some thyroid testing with her primary care doctor if this has not been done in a while.  At this time there does not appear to be any evidence of an acute emergency medical condition and the patient appears stable for discharge with appropriate outpatient follow up.    Dorie Rank, MD 09/18/19 1220

## 2019-09-18 NOTE — ED Triage Notes (Addendum)
Pt came in [place] post unwitnessed fall at home. Hx of multiple falls this month. Per family they think she could have hit her head on the corner of the dresser while getting up to go to the bathroom. EMS notes a Right Sided Temporal Hematoma, Right Sided Shoulder Pain, and Right Hip Pain. EMS placed patient in a C-Collar. 142/84, 68HR, 16RR, 96%RA.   Phone Number Given to call: [phone]

## 2019-10-06 DIAGNOSIS — M25511 Pain in right shoulder: Secondary | ICD-10-CM | POA: Diagnosis not present

## 2019-10-07 ENCOUNTER — Encounter: Payer: Self-pay | Admitting: Cardiology

## 2019-10-07 ENCOUNTER — Ambulatory Visit: Payer: Medicare Other | Admitting: Cardiology

## 2019-10-07 ENCOUNTER — Other Ambulatory Visit: Payer: Self-pay

## 2019-10-07 VITALS — Temp 95.0°F | Resp 16 | Ht 65.0 in | Wt 143.0 lb

## 2019-10-07 DIAGNOSIS — I10 Essential (primary) hypertension: Secondary | ICD-10-CM | POA: Diagnosis not present

## 2019-10-07 DIAGNOSIS — I071 Rheumatic tricuspid insufficiency: Secondary | ICD-10-CM

## 2019-10-07 DIAGNOSIS — I951 Orthostatic hypotension: Secondary | ICD-10-CM | POA: Diagnosis not present

## 2019-10-07 DIAGNOSIS — N1831 Chronic kidney disease, stage 3a: Secondary | ICD-10-CM | POA: Diagnosis not present

## 2019-10-07 DIAGNOSIS — R0600 Dyspnea, unspecified: Secondary | ICD-10-CM | POA: Diagnosis not present

## 2019-10-07 DIAGNOSIS — R0609 Other forms of dyspnea: Secondary | ICD-10-CM

## 2019-10-07 NOTE — Patient Instructions (Addendum)
Do not take Procardia starting today.  Discontinue Hydralazine   Hipotensin Hypotension Cuando el corazn late, bombea la sangre a travs del cuerpo. La fuerza que origina es la presin arterial. Si sufre hipotensin, la presin arterial es baja. Cuando la presin arterial es demasiado baja, es posible que no llegue sangre suficiente al cerebro o a otras partes del cuerpo. Eso puede causar que se sienta dbil o mareado, que tenga latidos cardacos rpidos o incluso que pierda el conocimiento (se desmaye). La presin arterial baja puede ser inofensiva o puede causar problemas graves. Cules son las causas?  Prdida de sangre.  No tener suficiente agua en el cuerpo (deshidratacin).  Problemas cardacos.  Problemas hormonales.  Embarazo.  Una infeccin grave.  No tener la cantidad suficiente de ciertos nutrientes.  Reacciones alrgicas muy graves.  Ciertos medicamentos. Qu incrementa el riesgo?  Edad. El riesgo aumenta a medida que avanza la edad.  Afecciones del corazn o el cerebro y la mdula espinal (el sistema nervioso central).  Tomar ciertos medicamentos.  Estar embarazada. Cules son los signos o los sntomas?  Sentir: ? Debilidad. ? Desvanecimiento. ? Mareos. ? Cansancio (fatiga).  Visin borrosa.  Latidos cardacos acelerados.  Desmayo, en casos muy graves. Cmo se trata?  Cambios en la dieta. Estos cambios implican comer con ms sal (sodio) o tomar ms agua.  Medicamentos para elevar la presin arterial.  Cambiar la cantidad que toma (la dosis) de algunos de sus medicamentos.  Usar medias de compresin. Estas medias ayudan a Mining engineer formacin de cogulos de [place] y a reducir la hinchazn de las piernas. En algunos casos, puede ser necesario ir al hospital para:  Reposicin de lquidos. Esto implica que continuar recibiendo lquidos por una va intravenosa.  Reposicin de sangre. Este es un procedimiento en el cual recibir sangre de un  donante a travs de una va intravenosa (transfusin).  El tratamiento de una infeccin o problemas cardacos, si corresponde.  Control. Necesitar supervisin [employer] de los medicamentos que est tomando. Siga estas indicaciones en su casa: Comida y bebida   Beba suficiente lquido para mantener el pis (la [place]) de color amarillo plido.  Siga una dieta saludable. Siga las indicaciones del mdico respecto de lo que puede comer o beber. Una dieta saludable incluye: ? Lambert Mody y verduras frescas. ? Cereales integrales. ? Carnes bajas en grasa Dorothea Glassman). ? Productos lcteos descremados.  Consuma ms sal nicamente como se lo hayan indicado. No agregue ms sal a las comidas a menos que el mdico se lo indique.  Consuma pequeas cantidades de comida con ms frecuencia.  No se levante rpido despus de comer. Medicamentos  [employer] de venta libre y los recetados solamente como se lo haya indicado el mdico. ? Siga las indicaciones del mdico acerca de cambiar la cantidad de medicamentos que toma, si corresponde. ? No deje de tomar los medicamentos ni cambie la dosis por su cuenta. Indicaciones generales   Use medias de compresin como se lo haya indicado el mdico.  Levntese despacio cuando est acostado o sentado.  No tome duchas con agua muy caliente y evite el calor como se lo haya indicado el mdico.  Reanude sus actividades normales segn lo indicado por el mdico. Pregunte qu actividades son seguras para usted.  No consuma ningn producto que contenga nicotina o tabaco, como cigarrillos, cigarrillos electrnicos y tabaco de Higher education careers adviser. Si necesita ayuda para dejar de fumar, consulte al mdico.  Concurra a todas las visitas de control como  se lo haya indicado el mdico. Esto es importante. Comunquese con un mdico si:  Vomita.  Presenta heces lquidas (diarrea).  Tiene fiebre por ms de 2 a 3das.  Tiene ms sed que lo  habitual.  Se siente dbil y cansado. Solicite ayuda inmediatamente si:  Electronics engineer.  Tiene latidos cardacos rpidos o irregulares.  Pierde la sensibilidad (tiene adormecimiento) en cualquier parte del cuerpo.  No puede mover los brazos ni las piernas.  Tiene dificultad para hablar.  Se siente transpirado o mareado.  Se desmaya.  Tiene dificultad para respirar.  Tiene dificultad para mantenerse despierto.  Se siente desorientado (confundido). Resumen  La hipotensin tambin se llama presin arterial baja. Se produce cuando la fuerza de la sangre que bombea en las arterias es demasiado dbil.  La hipotensin puede ser inofensiva o puede causar problemas graves.  El tratamiento puede incluir cambios en la alimentacin y los medicamentos y el uso de medias de compresin.  En algunos casos muy graves, puede ser [employer] ir al hospital. Esta informacin no tiene Marine scientist el consejo del mdico. Asegrese de hacerle al mdico cualquier pregunta que tenga. Document Revised: 02/17/2018 Document Reviewed: 02/17/2018 Elsevier Patient Education  [place].

## 2019-10-07 NOTE — Progress Notes (Signed)
Primary Physician/Referring:  Elwyn Reach, MD  Patient ID: Casey Liu, female    DOB: 08/30/1934, 85 y.o.   MRN: [id number]  Chief Complaint  Patient presents with  . Hypertension  . Shortness of Breath   HPI: Casey Liu  is a 85 y.o. female  with  hypertension, obstructive sleep apnea on CPAP, WHO group 2 PH, type II diabetes mellitus with stage 3 CKD, hyperlipidemia.  She has chronic dyspnea on exertion and very sensitive about her blood pressure changes.  She now presents here for evaluation of hypertension and dyspnea on exertion.  She had COVID-19 infection in January 2021, and shortness of breath and not feeling well.  She now presents for follow-up.  Since then she noticed marked a significantly definitely has had 1 fall but fortunately did not injure herself.  Past Medical History:  Diagnosis Date  . Arthritis   . Cataract    right  . Diabetes mellitus without complication ([place])   . Family history of anesthesia complication    mother had "three heart attacks after anesthesia"  . Headache(784.0)    hx of  . History of stomach ulcers   . Hypertension    Dr. Jonelle Sidle [phone]  . Pneumonia    hx of   . Seasonal allergies   . Sleep apnea    cpap  . Stroke [facility])    left side weakness  . Urinary tract infection    hx of    Past Surgical History:  Procedure Laterality Date  . ABDOMINAL HYSTERECTOMY    . APPENDECTOMY    . BLADDER SURGERY    . CATARACT EXTRACTION W/PHACO  07/07/2012   Procedure: CATARACT EXTRACTION PHACO AND INTRAOCULAR LENS PLACEMENT (IOC);  Surgeon: Adonis Brook, MD;  Location: [place];  Service: Ophthalmology;  Laterality: Right;  . CESAREAN SECTION     x 1  . CHOLECYSTECTOMY    . EYE SURGERY     "seven eye surgery"  . KNEE SURGERY     left knee  . OVARIAN CYST REMOVAL    . TOTAL KNEE ARTHROPLASTY Right 11/12/2015   Procedure: TOTAL KNEE ARTHROPLASTY;  Surgeon: Frederik Pear, MD;  Location: [place];   Service: Orthopedics;  Laterality: Right;  . TOTAL KNEE ARTHROPLASTY Left 04/23/2016   Procedure: LEFT TOTAL KNEE ARTHROPLASTY;  Surgeon: Frederik Pear, MD;  Location: [place];  Service: Orthopedics;  Laterality: Left;  . TUBAL LIGATION     Social History   Tobacco Use  . Smoking status: Never Smoker  . Smokeless tobacco: Never Used  Substance Use Topics  . Alcohol use: No    Comment: social hx     Review of Systems  Cardiovascular: Positive for dyspnea on exertion and leg swelling. Negative for chest pain.  Musculoskeletal: Positive for arthritis.  Gastrointestinal: Negative for melena.  Neurological: Positive for dizziness.   Objective  Temperature (!) 95 F (35 C), temperature source Temporal, resp. rate 16, height 5\' 5"  (1.651 m), weight 143 lb (64.9 kg), SpO2 96 %. Body mass index is 23.8 kg/m.  Vitals with BMI 10/07/2019 09/18/2019 09/18/2019  Height 5\' 5"  - -  Weight 143 lbs - -  BMI [id number] - -  Systolic - 123456 0000000  Diastolic - 73 56  Pulse - 58 57    Orthostatic VS for the past 72 hrs (Last 3 readings):  Orthostatic BP Patient Position BP Location Cuff Size Orthostatic Pulse  10/07/19 1536 (!) 79/46 Standing Left Arm Normal 71  10/07/19 1535 96/55 Sitting Left Arm Normal 68  10/07/19 1513 104/56 Supine Left Arm Normal 65       Physical Exam  Constitutional: She appears well-developed and well-nourished. No distress.  HENT:  Head: Atraumatic.  Cardiovascular: Normal rate, regular rhythm, normal heart sounds and intact distal pulses. Exam reveals no gallop.  No murmur heard. Pulmonary/Chest: Effort normal and breath sounds normal.  Abdominal: Soft. Bowel sounds are normal.  Psychiatric: She has a normal mood and affect.   Radiology: No results found.  Laboratory examination:   CMP Latest Ref Rng & Units 09/18/2019 02/16/2019 09/29/2018  Glucose 70 - 99 mg/dL 114(H) 124(H) 110(H)  BUN 8 - 23 mg/dL 35(H) 30(H) 25(H)  Creatinine 0.44 - 1.00 mg/dL 1.11(H) 1.15(H) 1.15(H)    Sodium 135 - 145 mmol/L 140 141 138  Potassium 3.5 - 5.1 mmol/L 4.8 4.8 4.0  Chloride 98 - 111 mmol/L 109 104 105  CO2 22 - 32 mmol/L 21(L) 20 23  Calcium 8.9 - 10.3 mg/dL 10.1 10.3 9.1  Total Protein 6.5 - 8.1 g/dL - - 6.8  Total Bilirubin 0.3 - 1.2 mg/dL - - 0.5  Alkaline Phos 38 - 126 U/L - - 61  AST 15 - 41 U/L - - 20  ALT 0 - 44 U/L - - 16   CBC Latest Ref Rng & Units 09/18/2019 09/29/2018 01/31/2018  WBC 4.0 - 10.5 K/uL 7.3 7.5 7.4  Hemoglobin 12.0 - 15.0 g/dL 11.6(L) 11.0(L) 13.9  Hematocrit 36.0 - 46.0 % 36.9 36.3 45.2  Platelets 150 - 400 K/uL 249 317 256   External labs:  Cholesterol, total 188.000 M 05/05/2017 HDL 60.000 M 05/05/2017 LDL 109 Triglycerides 95.000 M 05/05/2017  Medications   There are no discontinued medications. Current Meds  Medication Sig  . acetaminophen (TYLENOL) 325 MG tablet Take 650 mg by mouth every 6 (six) hours as needed for mild pain or headache.  Marland Kitchen aspirin EC 81 MG tablet Take 81 mg by mouth daily.  Marland Kitchen atenolol (TENORMIN) 100 MG tablet Take 1 tablet (100 mg total) by mouth daily.  . bimatoprost (LUMIGAN) 0.01 % SOLN Place 1 drop into both eyes at bedtime.   . brimonidine-timolol (COMBIGAN) 0.2-0.5 % ophthalmic solution Place 1 drop into both eyes every 12 (twelve) hours.  Mariane Baumgarten Sodium (DSS) 100 MG CAPS Take 100 mg by mouth 2 (two) times daily as needed for constipation.  . fluticasone (FLONASE) 50 MCG/ACT nasal spray Place 1 spray into both nostrils daily.   . metFORMIN (GLUCOPHAGE) 500 MG tablet Take 500 mg by mouth 2 (two) times daily with a meal.   . NIFEdipine (ADALAT CC) 90 MG 24 hr tablet Take 90 mg by mouth daily.  . nitroGLYCERIN (NITROSTAT) 0.4 MG SL tablet Place 0.4 mg under the tongue every 5 (five) minutes as needed for chest pain.  Marland Kitchen olmesartan-hydrochlorothiazide (BENICAR HCT) 40-12.5 MG tablet Take 1 tablet by mouth daily.  Marland Kitchen Propylene Glycol (SYSTANE BALANCE OP) Apply to eye daily.  Marland Kitchen spironolactone (ALDACTONE) 25 MG  tablet Take 1 tablet (25 mg total) by mouth daily.    Cardiac Studies:   Lexiscan Sestamibi 02/07/11: Normal perfusion without ischemia. Normal LVEF.   Echocardiogram 02/23/2019:  Normal LV systolic function with EF 54%. Left ventricle cavity is normal in size. Mild concentric hypertrophy of the left ventricle. Normal global wall motion. Doppler evidence of grade I (impaired) diastolic dysfunction, normal LAP. Calculated EF 54%. Left atrial cavity is mildly dilated at 3.9 cm in 4  chamber view. Structurally normal appearing mitral valve. Mild to moderate mitral regurgitation. Moderate tricuspid regurgitation. Mild  to moderate pulmonary hypertension. Estimated pulmonary artery systolic pressure is 41 mm Hg with RA pressure estimated at 3 mm Hg. RVSP measures 89mmHg. No significant change from  Echocardiogram 07/08/2016.  EKG:  EKG 10/07/2019: Normal sinus rhythm with rate of 71 bpm, left atrial abnormality, incomplete right bundle branch block.  No evidence of ischemia.     Assessment     ICD-10-CM   1. Essential hypertension  I10 EKG 12-Lead  2. Moderate tricuspid regurgitation  I07.1   3. Dyspnea on exertion  R06.00   4. Chronic diastolic (congestive) heart failure (HCC)  I50.32   5. Stage 3a chronic kidney disease  N18.31     Recommendations:   Donnelle Mcbroom  is a 85 y.o. female  with  hypertension, obstructive sleep apnea on CPAP, WHO group 2 PH, type II diabetes mellitus with stage 3 CKD, hyperlipidemia.  She has chronic dyspnea on exertion and very sensitive about her blood pressure changes.  She is severely orthostatic today.  Since COVID-19 infection in January, she has not felt well with marked dizziness and has had 1 fall.  I will discontinue hydralazine and also Procardia for now, her daughter is present, she will continue to monitor the blood pressure closely and do orthostatics at home and she will call us by Monday or Tuesday if standing blood pressure is  <120/70 mmHg.  If this is the case, will also decrease or hold Benicar HCT for now.  Her echocardiogram does reveal moderate tricuspid vegetation and mild pulmonary hypertension, is probably related to diastolic dysfunction and WHO group 2 pulmonary hypertension.  Reviewed her external records, she does have mild hyperlipidemia and in view of diabetic state, would recommend low-dose high intensity statin and try to have target LDL to around 70.  I will wait upon recovery of her orthostatic changes prior to initiating statins.  I will see her back in 3 to 4 weeks for follow-up.   Adrian Prows, MD, [facility] 10/07/2019, 4:07 PM [facility]. [place] Office: [phone]

## 2019-10-25 DIAGNOSIS — M24811 Other specific joint derangements of right shoulder, not elsewhere classified: Secondary | ICD-10-CM | POA: Diagnosis not present

## 2019-10-28 DIAGNOSIS — E119 Type 2 diabetes mellitus without complications: Secondary | ICD-10-CM | POA: Diagnosis not present

## 2019-10-28 DIAGNOSIS — Z960 Presence of urogenital implants: Secondary | ICD-10-CM | POA: Diagnosis not present

## 2019-10-28 DIAGNOSIS — E785 Hyperlipidemia, unspecified: Secondary | ICD-10-CM | POA: Diagnosis not present

## 2019-10-28 DIAGNOSIS — S43004A Unspecified dislocation of right shoulder joint, initial encounter: Secondary | ICD-10-CM | POA: Diagnosis not present

## 2019-10-28 DIAGNOSIS — I10 Essential (primary) hypertension: Secondary | ICD-10-CM | POA: Diagnosis not present

## 2019-10-31 DIAGNOSIS — S43401D Unspecified sprain of right shoulder joint, subsequent encounter: Secondary | ICD-10-CM | POA: Diagnosis not present

## 2019-10-31 DIAGNOSIS — M25611 Stiffness of right shoulder, not elsewhere classified: Secondary | ICD-10-CM | POA: Diagnosis not present

## 2019-10-31 DIAGNOSIS — M25612 Stiffness of left shoulder, not elsewhere classified: Secondary | ICD-10-CM | POA: Diagnosis not present

## 2019-11-02 DIAGNOSIS — H401132 Primary open-angle glaucoma, bilateral, moderate stage: Secondary | ICD-10-CM | POA: Diagnosis not present

## 2019-11-02 DIAGNOSIS — H35371 Puckering of macula, right eye: Secondary | ICD-10-CM | POA: Diagnosis not present

## 2019-11-02 DIAGNOSIS — H04123 Dry eye syndrome of bilateral lacrimal glands: Secondary | ICD-10-CM | POA: Diagnosis not present

## 2019-11-02 DIAGNOSIS — Z961 Presence of intraocular lens: Secondary | ICD-10-CM | POA: Diagnosis not present

## 2019-11-02 DIAGNOSIS — E119 Type 2 diabetes mellitus without complications: Secondary | ICD-10-CM | POA: Diagnosis not present

## 2019-11-02 DIAGNOSIS — H20021 Recurrent acute iridocyclitis, right eye: Secondary | ICD-10-CM | POA: Diagnosis not present

## 2019-11-14 NOTE — Progress Notes (Deleted)
Primary Physician/Referring:  Elwyn Reach, MD  Patient ID: Casey Liu, female    DOB: March 09, 1935, 85 y.o.   MRN: [id number]  No chief complaint on file.   HPI   *** Casey Liu  is a 85 y.o. female  with  hypertension, obstructive sleep apnea on CPAP, WHO group 2 PH, type II diabetes mellitus with stage 3 CKD, hyperlipidemia.  She has chronic dyspnea on exertion and very sensitive about her blood pressure changes.  She now presents here for evaluation of hypertension and dyspnea on exertion.  She had COVID-19 infection in January 2021, and shortness of breath and not feeling well.  She now presents for follow-up.  Since then she noticed marked a significantly definitely has had 1 fall but fortunately did not injure herself.***  Past Medical History:  Diagnosis Date  . Arthritis   . Cataract    right  . Diabetes mellitus without complication ([place])   . Family history of anesthesia complication    mother had "three heart attacks after anesthesia"  . Headache(784.0)    hx of  . History of stomach ulcers   . Hypertension    Dr. Jonelle Sidle [phone]  . Pneumonia    hx of   . Seasonal allergies   . Sleep apnea    cpap  . Stroke [facility])    left side weakness  . Urinary tract infection    hx of    Past Surgical History:  Procedure Laterality Date  . ABDOMINAL HYSTERECTOMY    . APPENDECTOMY    . BLADDER SURGERY    . CATARACT EXTRACTION W/PHACO  07/07/2012   Procedure: CATARACT EXTRACTION PHACO AND INTRAOCULAR LENS PLACEMENT (IOC);  Surgeon: Adonis Brook, MD;  Location: [place];  Service: Ophthalmology;  Laterality: Right;  . CESAREAN SECTION     x 1  . CHOLECYSTECTOMY    . EYE SURGERY     "seven eye surgery"  . KNEE SURGERY     left knee  . OVARIAN CYST REMOVAL    . TOTAL KNEE ARTHROPLASTY Right 11/12/2015   Procedure: TOTAL KNEE ARTHROPLASTY;  Surgeon: Frederik Pear, MD;  Location: [place];  Service: Orthopedics;  Laterality: Right;  .  TOTAL KNEE ARTHROPLASTY Left 04/23/2016   Procedure: LEFT TOTAL KNEE ARTHROPLASTY;  Surgeon: Frederik Pear, MD;  Location: [place];  Service: Orthopedics;  Laterality: Left;  . TUBAL LIGATION     Social History   Tobacco Use  . Smoking status: Never Smoker  . Smokeless tobacco: Never Used  Substance Use Topics  . Alcohol use: No    Comment: social hx     Marital Status: Single  ROS   Review of Systems  Cardiovascular: Positive for dyspnea on exertion and leg swelling. Negative for chest pain.  Musculoskeletal: Positive for arthritis.  Gastrointestinal: Negative for melena.  Neurological: Positive for dizziness.   Objective  There were no vitals taken for this visit. There is no height or weight on file to calculate BMI.  Vitals with BMI 10/07/2019 09/18/2019 09/18/2019  Height 5\' 5"  - -  Weight 143 lbs - -  BMI [id number] - -  Systolic - 123456 0000000  Diastolic - 73 56  Pulse - 58 57    No data found.     Physical Exam  Constitutional: She appears well-developed and well-nourished. No distress.  HENT:  Head: Atraumatic.  Cardiovascular: Normal rate, regular rhythm, normal heart sounds and intact distal pulses. Exam reveals no gallop.  No murmur  heard. Pulmonary/Chest: Effort normal and breath sounds normal.  Abdominal: Soft. Bowel sounds are normal.  Psychiatric: She has a normal mood and affect.    Radiology:   No results found.  Laboratory examination:   CMP Latest Ref Rng & Units 09/18/2019 02/16/2019 09/29/2018  Glucose 70 - 99 mg/dL 114(H) 124(H) 110(H)  BUN 8 - 23 mg/dL 35(H) 30(H) 25(H)  Creatinine 0.44 - 1.00 mg/dL 1.11(H) 1.15(H) 1.15(H)  Sodium 135 - 145 mmol/L 140 141 138  Potassium 3.5 - 5.1 mmol/L 4.8 4.8 4.0  Chloride 98 - 111 mmol/L 109 104 105  CO2 22 - 32 mmol/L 21(L) 20 23  Calcium 8.9 - 10.3 mg/dL 10.1 10.3 9.1  Total Protein 6.5 - 8.1 g/dL - - 6.8  Total Bilirubin 0.3 - 1.2 mg/dL - - 0.5  Alkaline Phos 38 - 126 U/L - - 61  AST 15 - 41 U/L - - 20  ALT 0 -  44 U/L - - 16   CBC Latest Ref Rng & Units 09/18/2019 09/29/2018 01/31/2018  WBC 4.0 - 10.5 K/uL 7.3 7.5 7.4  Hemoglobin 12.0 - 15.0 g/dL 11.6(L) 11.0(L) 13.9  Hematocrit 36.0 - 46.0 % 36.9 36.3 45.2  Platelets 150 - 400 K/uL 249 317 256   External labs:  ***05/05/2017: Cholesterol 188, HDL 60, LDL 109, Triglycerides 95.  Medications   There are no discontinued medications. No outpatient medications have been marked as taking for the 11/16/19 encounter (Appointment) with Adrian Prows, MD.    Cardiac Studies:   *** Lexiscan Sestamibi 02/07/11: Normal perfusion without ischemia. Normal LVEF.  Split Night Study 07/24/2016: Obstructive Sleep Apnea    Echocardiogram 02/23/2019:  Normal LV systolic function with EF 54%. Left ventricle cavity is normal in size. Mild concentric hypertrophy of the left ventricle. Normal global wall motion. Doppler evidence of grade I (impaired) diastolic dysfunction, normal LAP. Calculated EF 54%. Left atrial cavity is mildly dilated at 3.9 cm in 4 chamber view. Structurally normal appearing mitral valve. Mild to moderate mitral regurgitation. Moderate tricuspid regurgitation. Mild  to moderate pulmonary hypertension. Estimated pulmonary artery systolic pressure is 41 mm Hg with RA pressure estimated at 3 mm Hg. RVSP measures 72mmHg. No significant change from  Echocardiogram 07/08/2016.  EKG: *** 10/07/2019: Normal sinus rhythm with rate of 71 bpm, left atrial abnormality, incomplete right bundle branch block.  No evidence of ischemia.     Assessment   No diagnosis found.  Recommendations:    ***Casey Liu  is a 85 y.o. female  with  hypertension, obstructive sleep apnea on CPAP, WHO group 2 PH, type II diabetes mellitus with stage 3 CKD, hyperlipidemia.  She has chronic dyspnea on exertion and very sensitive about her blood pressure changes.  She is severely orthostatic today.  Since COVID-19 infection in January, she has not felt well  with marked dizziness and has had 1 fall.  I will discontinue hydralazine and also Procardia for now, her daughter is present, she will continue to monitor the blood pressure closely and do orthostatics at home and she will call us by Monday or Tuesday if standing blood pressure is <120/70 mmHg.  If this is the case, will also decrease or hold Benicar HCT for now.  Her echocardiogram does reveal moderate tricuspid vegetation and mild pulmonary hypertension, is probably related to diastolic dysfunction and WHO group 2 pulmonary hypertension.  Reviewed her external records, she does have mild hyperlipidemia and in view of diabetic state, would recommend low-dose high intensity statin  and try to have target LDL to around 70.  I will wait upon recovery of her orthostatic changes prior to initiating statins.  I will see her back in 3 to 4 weeks for follow-up.***  Adrian Prows, MD, [facility] 11/14/2019, 3:03 PM [facility]. [place] Pager: [phone] Office: [phone] If no answer Cell [phone]

## 2019-11-15 ENCOUNTER — Ambulatory Visit: Payer: Medicare Other | Admitting: Cardiology

## 2019-11-15 DIAGNOSIS — Z4689 Encounter for fitting and adjustment of other specified devices: Secondary | ICD-10-CM | POA: Diagnosis not present

## 2019-11-16 ENCOUNTER — Ambulatory Visit: Payer: Medicare Other | Admitting: Cardiology

## 2019-12-03 NOTE — Progress Notes (Signed)
Primary Physician/Referring:  Elwyn Reach, MD  Patient ID: Casey Liu, female    DOB: August 07, 1934, 85 y.o.   MRN: [id number]  Chief Complaint  Patient presents with  . Orthostatic Hyportension  . Dizziness  . Shortness of Breath    3 month f/u    HPI    Casey Liu  is a 85 y.o. female  with  hypertension, obstructive sleep apnea on CPAP, WHO group 2 PH, type II diabetes mellitus with stage 3 CKD, hyperlipidemia.  She has chronic dyspnea on exertion and very sensitive about her blood pressure changes.   On her last office visit in March 2021, she had recently recuperated from COVID-19 infection and I had discontinued Procardia and hydralazine in view of severe orthostatic hypotension.  She now presents here for evaluation of hypertension and dyspnea on exertion.  She still has dizziness but symptoms have improved significantly.  She has not had any syncope.  Dyspnea has remained stable.  She has recuperated well from COVID-19 infection.  Past Medical History:  Diagnosis Date  . Arthritis   . Cataract    right  . Diabetes mellitus without complication ([place])   . Family history of anesthesia complication    mother had "three heart attacks after anesthesia"  . Headache(784.0)    hx of  . History of stomach ulcers   . Hypertension    Dr. Jonelle Sidle [phone]  . Pneumonia    hx of   . Seasonal allergies   . Sleep apnea    cpap  . Stroke [facility])    left side weakness  . Urinary tract infection    hx of    Past Surgical History:  Procedure Laterality Date  . ABDOMINAL HYSTERECTOMY    . APPENDECTOMY    . BLADDER SURGERY    . CATARACT EXTRACTION W/PHACO  07/07/2012   Procedure: CATARACT EXTRACTION PHACO AND INTRAOCULAR LENS PLACEMENT (IOC);  Surgeon: Adonis Brook, MD;  Location: [place];  Service: Ophthalmology;  Laterality: Right;  . CESAREAN SECTION     x 1  . CHOLECYSTECTOMY    . EYE SURGERY     "seven eye surgery"  . KNEE  SURGERY     left knee  . OVARIAN CYST REMOVAL    . TOTAL KNEE ARTHROPLASTY Right 11/12/2015   Procedure: TOTAL KNEE ARTHROPLASTY;  Surgeon: Frederik Pear, MD;  Location: [place];  Service: Orthopedics;  Laterality: Right;  . TOTAL KNEE ARTHROPLASTY Left 04/23/2016   Procedure: LEFT TOTAL KNEE ARTHROPLASTY;  Surgeon: Frederik Pear, MD;  Location: [place];  Service: Orthopedics;  Laterality: Left;  . TUBAL LIGATION     Family History  Problem Relation Age of Onset  . Breast cancer Daughter 13  . Healthy Mother   . Dementia Mother   . Healthy Father    Social History   Tobacco Use  . Smoking status: Never Smoker  . Smokeless tobacco: Never Used  Substance Use Topics  . Alcohol use: No    Comment: social hx     Marital Status: Single  ROS   Review of Systems  Cardiovascular: Positive for dyspnea on exertion. Negative for chest pain and leg swelling.  Musculoskeletal: Positive for arthritis and back pain.  Gastrointestinal: Negative for melena.  Neurological: Positive for dizziness.   Objective  Blood pressure (!) 168/76, pulse 61, temperature 97.6 F (36.4 C), temperature source Temporal, resp. rate 16, height 5\' 5"  (1.651 m), weight 142 lb 8 oz (64.6 kg), SpO2 95 %.  Body mass index is 23.71 kg/m.  Vitals with BMI 12/05/2019 10/07/2019 09/18/2019  Height 5\' 5"  5\' 5"  -  Weight 142 lbs 8 oz 143 lbs -  BMI [id number] -  Systolic [id number] - 123456  Diastolic 76 - 73  Pulse 61 - 58    Orthostatic VS for the past 72 hrs (Last 3 readings):  Orthostatic BP Patient Position BP Location Cuff Size Orthostatic Pulse  12/05/19 1650 130/76 Sitting -- -- 76  12/05/19 1539 133/53 Standing Left Arm Large 55  12/05/19 1537 145/65 Sitting Left Arm Large 60  12/05/19 1535 159/67 Supine Left Arm Large 59    Orthostatic VS for the past 72 hrs (Last 3 readings):  Orthostatic BP Patient Position BP Location Cuff Size Orthostatic Pulse  12/05/19 1650 130/76 Sitting -- -- 76  12/05/19 1539 133/53 Standing Left  Arm Large 55  12/05/19 1537 145/65 Sitting Left Arm Large 60  12/05/19 1535 159/67 Supine Left Arm Large 59       Physical Exam  Constitutional: She appears well-developed and well-nourished. No distress.  HENT:  Head: Atraumatic.  Cardiovascular: Normal rate, regular rhythm, normal heart sounds and intact distal pulses. Exam reveals no gallop.  No murmur heard. No JVD, no edema  Pulmonary/Chest: Effort normal and breath sounds normal.  Abdominal: Soft. Bowel sounds are normal.   Radiology:   No results found.  Laboratory examination:   Recent Labs    02/16/19 0848 09/18/19 0510  NA 141 140  K 4.8 4.8  CL 104 109  CO2 20 21*  GLUCOSE 124* 114*  BUN 30* 35*  CREATININE 1.15* 1.11*  CALCIUM 10.3 10.1  GFRNONAA 45* 46*  GFRAA 52* 54*   CrCl cannot be calculated (Patient's most recent lab result is older than the maximum 21 days allowed.). CMP Latest Ref Rng & Units 09/18/2019 02/16/2019 09/29/2018  Glucose 70 - 99 mg/dL 114(H) 124(H) 110(H)  BUN 8 - 23 mg/dL 35(H) 30(H) 25(H)  Creatinine 0.44 - 1.00 mg/dL 1.11(H) 1.15(H) 1.15(H)  Sodium 135 - 145 mmol/L 140 141 138  Potassium 3.5 - 5.1 mmol/L 4.8 4.8 4.0  Chloride 98 - 111 mmol/L 109 104 105  CO2 22 - 32 mmol/L 21(L) 20 23  Calcium 8.9 - 10.3 mg/dL 10.1 10.3 9.1  Total Protein 6.5 - 8.1 g/dL - - 6.8  Total Bilirubin 0.3 - 1.2 mg/dL - - 0.5  Alkaline Phos 38 - 126 U/L - - 61  AST 15 - 41 U/L - - 20  ALT 0 - 44 U/L - - 16   CBC Latest Ref Rng & Units 09/18/2019 09/29/2018 01/31/2018  WBC 4.0 - 10.5 K/uL 7.3 7.5 7.4  Hemoglobin 12.0 - 15.0 g/dL 11.6(L) 11.0(L) 13.9  Hematocrit 36.0 - 46.0 % 36.9 36.3 45.2  Platelets 150 - 400 K/uL 249 317 256   .lastlipid External labs:  05/05/2017: Cholesterol 188, HDL 60, LDL 109, Triglycerides 95.  Medications   There are no discontinued medications. Current Meds  Medication Sig  . acetaminophen (TYLENOL) 325 MG tablet Take 650 mg by mouth every 6 (six) hours as needed for  mild pain or headache.  Marland Kitchen aspirin EC 81 MG tablet Take 81 mg by mouth daily.  Marland Kitchen atenolol (TENORMIN) 100 MG tablet Take 1 tablet (100 mg total) by mouth daily.  . bimatoprost (LUMIGAN) 0.01 % SOLN Place 1 drop into both eyes at bedtime.   . brimonidine-timolol (COMBIGAN) 0.2-0.5 % ophthalmic solution Place 1 drop into both eyes every  12 (twelve) hours.  Casey Liu Sodium (DSS) 100 MG CAPS Take 100 mg by mouth 2 (two) times daily as needed for constipation.  . fluticasone (FLONASE) 50 MCG/ACT nasal spray Place 1 spray into both nostrils daily.   . metFORMIN (GLUCOPHAGE) 500 MG tablet Take 500 mg by mouth 2 (two) times daily with a meal.   . NIFEdipine (ADALAT CC) 90 MG 24 hr tablet Take 90 mg by mouth daily. Do not take until next OV with me  . nitroGLYCERIN (NITROSTAT) 0.4 MG SL tablet Place 0.4 mg under the tongue every 5 (five) minutes as needed for chest pain.  Marland Kitchen olmesartan-hydrochlorothiazide (BENICAR HCT) 40-12.5 MG tablet Take 1 tablet by mouth daily.  Marland Kitchen Propylene Glycol (SYSTANE BALANCE OP) Apply to eye daily.  Marland Kitchen spironolactone (ALDACTONE) 25 MG tablet Take 1 tablet (25 mg total) by mouth daily.    Cardiac Studies:   Lexiscan Sestamibi 02/07/2011: Normal perfusion without ischemia. Normal LVEF.  Split Night Study 07/24/2016: Obstructive Sleep Apnea    Echocardiogram 02/23/2019:  Normal LV systolic function with EF 54%. Left ventricle cavity is normal in size. Mild concentric hypertrophy of the left ventricle. Normal global wall motion. Doppler evidence of grade I (impaired) diastolic dysfunction, normal LAP. Calculated EF 54%. Left atrial cavity is mildly dilated at 3.9 cm in 4 chamber view. Structurally normal appearing mitral valve. Mild to moderate mitral regurgitation. Moderate tricuspid regurgitation. Mild  to moderate pulmonary hypertension. Estimated pulmonary artery systolic pressure is 41 mm Hg with RA pressure estimated at 3 mm Hg. RVSP measures 14mmHg. No significant change  from  Echocardiogram 07/08/2016.  EKG:  EKG 11/25/2019: Normal sinus rhythm at rate of 61 bpm, left atrial enlargement, left axis deviation.  Low-voltage complexes.   No significant change from 10/07/2019.   Assessment     ICD-10-CM   1. Syncope and collapse  R55   2. Orthostatic hypotension  I95.1 EKG 12-Lead  3. Essential hypertension  I10   4. Stage 3a chronic kidney disease  N18.31      No orders of the defined types were placed in this encounter.  There are no discontinued medications.  Recommendations:   Casey Liu  is a 85 y.o. female  with  hypertension, obstructive sleep apnea on CPAP, WHO group 2 PH, type II diabetes mellitus with stage 3 CKD, hyperlipidemia.  She has chronic dyspnea on exertion and very sensitive about her blood pressure changes.  On her last office visit in March 2021, she had recently recuperated from COVID-19 infection and I had discontinued Procardia and hydralazine in view of severe orthostatic hypotension. No change in urine output. No recent ED evaluation. Has chronic renal insufficiency.   Today blood pressure is well controlled, she has not had any further episodes of syncope, dizziness.  She still gets dizzy when she suddenly stands up and she is mildly orthostatic today.  Continue present medications.  No clinical evidence of heart failure.  I will see her PRN.   Adrian Prows, MD, [facility] 12/05/2019, 5:07 PM [facility] [place] Office: [phone]

## 2019-12-05 ENCOUNTER — Other Ambulatory Visit: Payer: Self-pay

## 2019-12-05 ENCOUNTER — Ambulatory Visit: Payer: Medicare Other | Admitting: Cardiology

## 2019-12-05 ENCOUNTER — Encounter: Payer: Self-pay | Admitting: Cardiology

## 2019-12-05 VITALS — BP 168/76 | HR 61 | Temp 97.6°F | Resp 16 | Ht 65.0 in | Wt 142.5 lb

## 2019-12-05 DIAGNOSIS — N1831 Chronic kidney disease, stage 3a: Secondary | ICD-10-CM

## 2019-12-05 DIAGNOSIS — I951 Orthostatic hypotension: Secondary | ICD-10-CM | POA: Diagnosis not present

## 2019-12-05 DIAGNOSIS — I10 Essential (primary) hypertension: Secondary | ICD-10-CM

## 2019-12-05 DIAGNOSIS — R55 Syncope and collapse: Secondary | ICD-10-CM | POA: Diagnosis not present

## 2019-12-27 ENCOUNTER — Other Ambulatory Visit: Payer: Self-pay | Admitting: Cardiology

## 2019-12-27 DIAGNOSIS — I10 Essential (primary) hypertension: Secondary | ICD-10-CM

## 2020-01-31 DIAGNOSIS — S43101D Unspecified dislocation of right acromioclavicular joint, subsequent encounter: Secondary | ICD-10-CM | POA: Diagnosis not present

## 2020-01-31 DIAGNOSIS — M25611 Stiffness of right shoulder, not elsewhere classified: Secondary | ICD-10-CM | POA: Diagnosis not present

## 2020-01-31 DIAGNOSIS — R531 Weakness: Secondary | ICD-10-CM | POA: Diagnosis not present

## 2020-02-02 DIAGNOSIS — S43101D Unspecified dislocation of right acromioclavicular joint, subsequent encounter: Secondary | ICD-10-CM | POA: Diagnosis not present

## 2020-02-02 DIAGNOSIS — M25611 Stiffness of right shoulder, not elsewhere classified: Secondary | ICD-10-CM | POA: Diagnosis not present

## 2020-02-02 DIAGNOSIS — R531 Weakness: Secondary | ICD-10-CM | POA: Diagnosis not present

## 2020-02-06 DIAGNOSIS — R531 Weakness: Secondary | ICD-10-CM | POA: Diagnosis not present

## 2020-02-06 DIAGNOSIS — S43101D Unspecified dislocation of right acromioclavicular joint, subsequent encounter: Secondary | ICD-10-CM | POA: Diagnosis not present

## 2020-02-06 DIAGNOSIS — M25611 Stiffness of right shoulder, not elsewhere classified: Secondary | ICD-10-CM | POA: Diagnosis not present

## 2020-02-09 DIAGNOSIS — M25611 Stiffness of right shoulder, not elsewhere classified: Secondary | ICD-10-CM | POA: Diagnosis not present

## 2020-02-09 DIAGNOSIS — R531 Weakness: Secondary | ICD-10-CM | POA: Diagnosis not present

## 2020-02-09 DIAGNOSIS — S43101D Unspecified dislocation of right acromioclavicular joint, subsequent encounter: Secondary | ICD-10-CM | POA: Diagnosis not present

## 2020-02-13 ENCOUNTER — Ambulatory Visit: Payer: Medicare Other | Admitting: Adult Health

## 2020-02-14 DIAGNOSIS — S43101D Unspecified dislocation of right acromioclavicular joint, subsequent encounter: Secondary | ICD-10-CM | POA: Diagnosis not present

## 2020-02-14 DIAGNOSIS — R531 Weakness: Secondary | ICD-10-CM | POA: Diagnosis not present

## 2020-02-14 DIAGNOSIS — M25611 Stiffness of right shoulder, not elsewhere classified: Secondary | ICD-10-CM | POA: Diagnosis not present

## 2020-02-16 DIAGNOSIS — M25511 Pain in right shoulder: Secondary | ICD-10-CM | POA: Diagnosis not present

## 2020-02-21 DIAGNOSIS — R531 Weakness: Secondary | ICD-10-CM | POA: Diagnosis not present

## 2020-02-21 DIAGNOSIS — M25611 Stiffness of right shoulder, not elsewhere classified: Secondary | ICD-10-CM | POA: Diagnosis not present

## 2020-02-21 DIAGNOSIS — S43101D Unspecified dislocation of right acromioclavicular joint, subsequent encounter: Secondary | ICD-10-CM | POA: Diagnosis not present

## 2020-02-23 DIAGNOSIS — M25611 Stiffness of right shoulder, not elsewhere classified: Secondary | ICD-10-CM | POA: Diagnosis not present

## 2020-02-23 DIAGNOSIS — S43101D Unspecified dislocation of right acromioclavicular joint, subsequent encounter: Secondary | ICD-10-CM | POA: Diagnosis not present

## 2020-02-23 DIAGNOSIS — R531 Weakness: Secondary | ICD-10-CM | POA: Diagnosis not present

## 2020-02-28 DIAGNOSIS — M25611 Stiffness of right shoulder, not elsewhere classified: Secondary | ICD-10-CM | POA: Diagnosis not present

## 2020-02-28 DIAGNOSIS — R531 Weakness: Secondary | ICD-10-CM | POA: Diagnosis not present

## 2020-02-28 DIAGNOSIS — S43101D Unspecified dislocation of right acromioclavicular joint, subsequent encounter: Secondary | ICD-10-CM | POA: Diagnosis not present

## 2020-03-01 DIAGNOSIS — M25611 Stiffness of right shoulder, not elsewhere classified: Secondary | ICD-10-CM | POA: Diagnosis not present

## 2020-03-01 DIAGNOSIS — S43101D Unspecified dislocation of right acromioclavicular joint, subsequent encounter: Secondary | ICD-10-CM | POA: Diagnosis not present

## 2020-03-01 DIAGNOSIS — R531 Weakness: Secondary | ICD-10-CM | POA: Diagnosis not present

## 2020-03-06 DIAGNOSIS — R531 Weakness: Secondary | ICD-10-CM | POA: Diagnosis not present

## 2020-03-06 DIAGNOSIS — M25611 Stiffness of right shoulder, not elsewhere classified: Secondary | ICD-10-CM | POA: Diagnosis not present

## 2020-03-06 DIAGNOSIS — S43101D Unspecified dislocation of right acromioclavicular joint, subsequent encounter: Secondary | ICD-10-CM | POA: Diagnosis not present

## 2020-03-07 DIAGNOSIS — M25611 Stiffness of right shoulder, not elsewhere classified: Secondary | ICD-10-CM | POA: Diagnosis not present

## 2020-03-07 DIAGNOSIS — R531 Weakness: Secondary | ICD-10-CM | POA: Diagnosis not present

## 2020-03-07 DIAGNOSIS — S43101D Unspecified dislocation of right acromioclavicular joint, subsequent encounter: Secondary | ICD-10-CM | POA: Diagnosis not present

## 2020-03-12 DIAGNOSIS — H401132 Primary open-angle glaucoma, bilateral, moderate stage: Secondary | ICD-10-CM | POA: Diagnosis not present

## 2020-03-12 DIAGNOSIS — H35371 Puckering of macula, right eye: Secondary | ICD-10-CM | POA: Diagnosis not present

## 2020-03-12 DIAGNOSIS — E119 Type 2 diabetes mellitus without complications: Secondary | ICD-10-CM | POA: Diagnosis not present

## 2020-03-12 DIAGNOSIS — Z961 Presence of intraocular lens: Secondary | ICD-10-CM | POA: Diagnosis not present

## 2020-03-12 DIAGNOSIS — H04123 Dry eye syndrome of bilateral lacrimal glands: Secondary | ICD-10-CM | POA: Diagnosis not present

## 2020-03-30 DIAGNOSIS — N3001 Acute cystitis with hematuria: Secondary | ICD-10-CM | POA: Diagnosis not present

## 2020-03-30 DIAGNOSIS — Z4689 Encounter for fitting and adjustment of other specified devices: Secondary | ICD-10-CM | POA: Diagnosis not present

## 2020-04-03 DIAGNOSIS — M25511 Pain in right shoulder: Secondary | ICD-10-CM | POA: Diagnosis not present

## 2020-04-10 ENCOUNTER — Encounter: Payer: Self-pay | Admitting: Neurology

## 2020-04-24 ENCOUNTER — Ambulatory Visit: Payer: Medicare Other | Admitting: Adult Health

## 2020-05-13 ENCOUNTER — Encounter: Payer: Self-pay | Admitting: Neurology

## 2020-05-16 ENCOUNTER — Other Ambulatory Visit: Payer: Self-pay

## 2020-05-16 ENCOUNTER — Encounter: Payer: Self-pay | Admitting: Neurology

## 2020-05-16 ENCOUNTER — Ambulatory Visit (INDEPENDENT_AMBULATORY_CARE_PROVIDER_SITE_OTHER): Payer: Medicare Other | Admitting: Neurology

## 2020-05-16 VITALS — BP 155/80 | HR 57 | Ht 65.0 in | Wt 140.5 lb

## 2020-05-16 DIAGNOSIS — G4733 Obstructive sleep apnea (adult) (pediatric): Secondary | ICD-10-CM | POA: Diagnosis not present

## 2020-05-16 DIAGNOSIS — Z9989 Dependence on other enabling machines and devices: Secondary | ICD-10-CM | POA: Diagnosis not present

## 2020-05-16 NOTE — Progress Notes (Signed)
Subjective:    Patient ID: Casey Liu is a 85 y.o. female.  HPI     Interim history:   Casey Liu is a 85 year old right-handed woman with an underlying medical history of hypertension, pulmonary hypertension, type 2 diabetes, reflux disease, seasonal allergies, history of pneumonia, arthritis, status post left total knee arthroplasty on 04/23/2016, status post right total knee arthroplasty on 11/12/2015, who presents for follow-up consultation of her obstructive sleep apnea, on CPAP therapy. The patient is accompanied by a Spanish interpreter today. I last saw her on 12/11/16, , At which time she was compliant with her CPAP and feeling improved as far as her sleep quality, nocturia and sleep consolidation.  She saw Ward Givens, NP, in the interim on 06/16/2017, at which time she was doing well, tolerating therapy and was compliant with CPAP.  She saw Ward Givens on 12/16/2017, at which time her residual AHI was 10.4/h on a pressure of 9 cm.  She was compliant with treatment.  She was changed to AutoPap therapy on a pressure range of 5 to 15 cm.  She saw Ward Givens, NP, on 06/30/2018 at which time her compliance has declined a little bit.  She was reporting allergy symptoms and not using her CPAP all the time.  She had needed a piece of supply from her DME company which eventually was replaced.  She was advised to continue with treatment.    She was seen by Ward Givens on 02/08/2019, at which time she was suboptimal with her compliance for more than 4 hours.  Residual AHI was at goal on AutoPap.  She did have a higher leak.  Today, 05/16/20 (all dictated new, as well as above notes, some dictation done in note pad or Word, outside of chart, may appear as copied):   I reviewed her AutoPap compliance data for the past 90 days from 02/14/2020 through 05/13/2020, during which time the patient used her machine only 7 days with percent use days greater than 4  hours at 3% only, indicating very low compliance, essentially no usage after 03/12/2020.  She reports that she was unable to use her machine because of a cough.  Her cough has eventually improved and she restarted her machine last night. She sees cardiology, Dr. Einar Gip.  She has had orthostatic hypotension.  She had a fall and presented to the emergency room in February 2021.  She had a right shoulder x-ray and 2 views on 09/18/2019 and I reviewed the results: IMPRESSION: Age indeterminate AC joint separation. No evidence for fracture or dislocation.  She reports that she has been seeing orthopedics and she had therapy for her right shoulder.  The patient's allergies, current medications, family history, past medical history, past social history, past surgical history and problem list were reviewed and updated as appropriate.    Previously (copied from previous notes for reference):   I first met her on 07/09/2016 at the request of her cardiologist, at which time she reported a prior diagnosis of obstructive sleep apnea. She had an older CPAP machine. She had some morning headaches. I suggested we proceed with sleep study testing. She had a baseline sleep study, followed by a CPAP titration study. I went over her test results with her in detail today. Her baseline sleep study from 07/24/2016 showed a sleep efficiency of 78.1%, sleep latency 23 minutes, REM latency markedly delayed at 261.5 minutes. She had a decreased percentage of REM sleep, slow-wave sleep was absent, marked increase in stage II  sleep. Total AHI was 28.2 per hour, REM AHI 67.8 per hour, supine AHI 30.9 per hour. Average oxygen saturation was 96%, nadir was 73%. She had no significant PLMS. Based on her medical history, her sleep-related complaints and her sleep study results I invited her for a full night CPAP titration study. She had this on 08/14/2016. Sleep efficiency was 59%, sleep latency 9.5 minutes, REM latency markedly prolonged  at 319.5 minutes. She had a normal amount of slow-wave sleep, REM sleep was 12.5%. CPAP was titrated from 5 cm to 7 cm. Since she did not have complete elimination of her sleep disordered breathing I suggested a CPAP pressure of 8 cm at home. She did not have any significant PLMS, baseline oxygen saturation was 90%, nadir was 85%.   I reviewed her CPAP compliance data from 11/10/2016 through 12/09/2016, which is a total of 30 days, during which time she used her machine 29 days with percent used days greater than 4 hours at 90%, indicating excellent compliance with an average usage of 6 hours and 35 minutes, residual AHI is slightly suboptimal at 7.2 per hour, leak on the high side with the 95th percentile at 32.6 L/m on a pressure of 8 cm with EPR of 3.      07/09/2016: She was previously diagnosed with obstructive sleep apnea and placed on CPAP therapy. Sleep study testing was over 5 years ago, prior sleep study results are not available for my review. I reviewed your office note from 06/23/2016, which you kindly included.  The CPAP download is not available for my review today. Her machine is about 82 years old she estimates. She has been using a nasal mask which breaks easily. Prior to that she was prescribed a full face mask which she preferred. With CPAP she has been sleeping better, without CPAP she has a history of loud snoring. She has been waking up with headaches frequently. These are not severe or debilitating, dull and achy typically. She denies any restless leg symptoms but has residual knee pain bilaterally, left more than right. She has a cane available and also a walker but typically uses her cane only. She reports a bedtime of around 9 PM and while she falls asleep fairly quickly, she is often awake right after midnight and has trouble going back to sleep. Sleep was severely disrupted by multiple bathroom breaks, she says that she woke up to the bathroom up to 10 times per night. She has a  bedside commode. She is typically out of bed by 7:15 AM. She occasionally naps. Epworth sleepiness score is 9 out of 24, fatigue score is 57 out of 63. She lives with one of her daughters. She has a total of 7 children. She does not smoke or drink alcohol or use caffeine on a daily basis. She has not been able to use her CPAP for at least 2 weeks. She complains of discomfort with the mask and machine is not working properly.  Her Past Medical History Is Significant For: Past Medical History:  Diagnosis Date  . Arthritis   . Cataract    right  . Diabetes mellitus without complication ([place])   . Family history of anesthesia complication    mother had "three heart attacks after anesthesia"  . Headache(784.0)    hx of  . History of stomach ulcers   . Hypertension    Dr. Jonelle Sidle [phone]  . Pneumonia    hx of   . Seasonal allergies   .  Sleep apnea    cpap  . Stroke [facility])    left side weakness  . Urinary tract infection    hx of    Her Past Surgical History Is Significant For: Past Surgical History:  Procedure Laterality Date  . ABDOMINAL HYSTERECTOMY    . APPENDECTOMY    . BLADDER SURGERY    . CATARACT EXTRACTION W/PHACO  07/07/2012   Procedure: CATARACT EXTRACTION PHACO AND INTRAOCULAR LENS PLACEMENT (IOC);  Surgeon: Adonis Brook, MD;  Location: [place];  Service: Ophthalmology;  Laterality: Right;  . CESAREAN SECTION     x 1  . CHOLECYSTECTOMY    . EYE SURGERY     "seven eye surgery"  . KNEE SURGERY     left knee  . OVARIAN CYST REMOVAL    . TOTAL KNEE ARTHROPLASTY Right 11/12/2015   Procedure: TOTAL KNEE ARTHROPLASTY;  Surgeon: Frederik Pear, MD;  Location: [place];  Service: Orthopedics;  Laterality: Right;  . TOTAL KNEE ARTHROPLASTY Left 04/23/2016   Procedure: LEFT TOTAL KNEE ARTHROPLASTY;  Surgeon: Frederik Pear, MD;  Location: [place];  Service: Orthopedics;  Laterality: Left;  . TUBAL LIGATION      Her Family History Is Significant For: Family History  Problem Relation  Age of Onset  . Breast cancer Daughter 96  . Healthy Mother   . Dementia Mother   . Healthy Father     Her Social History Is Significant For: Social History   Socioeconomic History  . Marital status: Single    Spouse name: Not on file  . Number of children: 8  . Years of education: Not on file  . Highest education level: Not on file  Occupational History  . Not on file  Tobacco Use  . Smoking status: Never Smoker  . Smokeless tobacco: Never Used  Substance and Sexual Activity  . Alcohol use: No    Comment: social hx   . Drug use: No  . Sexual activity: Not on file  Other Topics Concern  . Not on file  Social History Narrative  . Not on file   Social Determinants of Health   Financial Resource Strain:   . Difficulty of Paying Living Expenses: Not on file  Food Insecurity:   . Worried About Charity fundraiser in the Last Year: Not on file  . Ran Out of Food in the Last Year: Not on file  Transportation Needs:   . Lack of Transportation (Medical): Not on file  . Lack of Transportation (Non-Medical): Not on file  Physical Activity:   . Days of Exercise per Week: Not on file  . Minutes of Exercise per Session: Not on file  Stress:   . Feeling of Stress : Not on file  Social Connections:   . Frequency of Communication with Friends and Family: Not on file  . Frequency of Social Gatherings with Friends and Family: Not on file  . Attends Religious Services: Not on file  . Active Member of Clubs or Organizations: Not on file  . Attends Archivist Meetings: Not on file  . Marital Status: Not on file    Her Allergies Are:  Allergies  Allergen Reactions  . Celebrex [Celecoxib] Nausea And Vomiting and Palpitations  . Latex Rash  :   Her Current Medications Are:  Outpatient Encounter Medications as of 05/16/2020  Medication Sig  . acetaminophen (TYLENOL) 325 MG tablet Take 650 mg by mouth every 6 (six) hours as needed for mild pain or headache.  Marland Kitchen  aspirin  EC 81 MG tablet Take 81 mg by mouth daily.  Marland Kitchen atenolol (TENORMIN) 100 MG tablet Take 1 tablet (100 mg total) by mouth daily.  Mariane Baumgarten Sodium (DSS) 100 MG CAPS Take 100 mg by mouth 2 (two) times daily as needed for constipation.  . metFORMIN (GLUCOPHAGE) 500 MG tablet Take 500 mg by mouth 2 (two) times daily with a meal.   . olmesartan-hydrochlorothiazide (BENICAR HCT) 40-12.5 MG tablet TAKE 1 TABLET BY MOUTH EVERY DAY  . Propylene Glycol (SYSTANE BALANCE OP) Apply to eye daily.  Marland Kitchen spironolactone (ALDACTONE) 25 MG tablet TAKE 1 TABLET BY MOUTH EVERY DAY  . [DISCONTINUED] bimatoprost (LUMIGAN) 0.01 % SOLN Place 1 drop into both eyes at bedtime.   . [DISCONTINUED] brimonidine-timolol (COMBIGAN) 0.2-0.5 % ophthalmic solution Place 1 drop into both eyes every 12 (twelve) hours.  . [DISCONTINUED] fluticasone (FLONASE) 50 MCG/ACT nasal spray Place 1 spray into both nostrils daily.   . [DISCONTINUED] NIFEdipine (ADALAT CC) 90 MG 24 hr tablet Take 90 mg by mouth daily. Do not take until next OV with me  . [DISCONTINUED] nitroGLYCERIN (NITROSTAT) 0.4 MG SL tablet Place 0.4 mg under the tongue every 5 (five) minutes as needed for chest pain.   No facility-administered encounter medications on file as of 05/16/2020.  :  Review of Systems:  Out of a complete 14 point review of systems, all are reviewed and negative with the exception of these symptoms as listed below: Review of Systems  Neurological:       Reports not using her CPAP machine for several weeks due to being sick and coughing. When she is well, states she wears it about 6-7 hours.   Epworth Sleepiness Scale 0= would never doze 1= slight chance of dozing 2= moderate chance of dozing 3= high chance of dozing  Sitting and reading: 2 Watching TV: 3 Sitting inactive in a public place (ex. Theater or meeting): 0 As a passenger in a car for an hour without a break: 1 Lying down to rest in the afternoon: 1 Sitting and talking to someone:  1 Sitting quietly after lunch (no alcohol): 1 In a car, while stopped in traffic: 2 Total: 11    Objective:  Neurological Exam  Physical Exam Physical Examination:   Vitals:   05/16/20 1023  BP: (!) 155/80  Pulse: (!) 57    General Examination: The patient is a very pleasant 85 y.o. female in no acute distress. She appears well-developed and well-nourished and well groomed.   HEENT:Normocephalic, atraumatic, pupils are equal, round and reactive to light. She has corrective eye glasses. S/p cataract repairs. Face is symmetric with normal facial animation and normal facial sensation. Speech is clear with no dysarthria noted. There is no hypophonia. There is no lip, neck/head, jaw or voice tremor. Neck is supple with full range of passive and active motion. There are no carotid bruits on auscultation. Oropharynx exam reveals: mildmouth dryness. Tongue protrudes centrally and palate elevates symmetrically.   Chest:Clear to auscultation without wheezing, rhonchi or crackles noted.  Heart:S1+S2+0, regular and normal without murmurs, rubs or gallops noted.   Abdomen:Soft, non-tender and non-distended.  Extremities:There is no significant edema in the distal lower extremities bilaterally.  Skin: Warm and dry without trophic changes noted. There are no varicose veins.  Musculoskeletal: exam reveals bony step in the right shoulder, limited range of motion of the right shoulder joint.   Neurologically:  Mental status: The patient is awake, alert and oriented in all 4  spheres. Herimmediate and remote memory, attention, language skills and fund of knowledge are appropriate. There is no evidence of aphasia, agnosia, apraxia or anomia. Speech is clear with normal prosody and enunciation. Thought process is linear. Mood is normaland affect is normal.  Cranial nerves II - XII are as described above under HEENT exam. Right shoulder height is decreased compared to left.  Motor exam:  Normal bulk, global strength of 4+ out of 5. She has no tremor, fine motor skills are grossly intact.  Cerebellar testing: No dysmetria or intention tremor.  Sensory exam: intact to light touch in the upper and lower extremitieswith mild decrease to sensation noted in the distal lower extremities.  Gait, station and balance: Shestands with difficultyand walks slowly and cautiously. She has a single-point cane.  Assessment and Plan:  In summary, Casey Benitez-Bonillais a very pleasant 85 year old female with an underlying medical history of hypertension, pulmonary hypertension, type 2 diabetes, reflux disease, seasonal allergies, history of pneumonia, arthritis, status post left total knee arthroplasty on 04/23/2016, status post right total knee arthroplasty on 11/12/2015, whopresents for follow-up consultation of her obstructive sleep apnea. She had sleep study testing in 2018. She establish CPAP therapy and has been compliant in the past. She stopped using her machine after developing a cough and not feeling well. She has improved over time and restarted using her machine last night. She reports that she slept well with it. She is motivated to continue with treatment and get back on track with it. She is commended for her treatment adherence previously and encouraged to get back on using her PAP regularly. We switched her treatment modality from CPAP to AutoPap. She does not mind the pressure. Her AHI has been adequate on AutoPap therapy. I suggested a six-month follow-up with one of our nurse practitioners. I answered all her questions today and the patient was in agreement. I spent 30 minutes in total face-to-face time and in reviewing records during pre-charting, more than 50% of which was spent in counseling and coordination of care, reviewing test results, reviewing medications and treatment regimen and/or in discussing or reviewing the diagnosis of OSA, the prognosis and treatment options.  Pertinent laboratory and imaging test results that were available during this visit with the patient were reviewed by me and considered in my medical decision making (see chart for details).

## 2020-05-16 NOTE — Patient Instructions (Signed)
I am glad to hear that your cough is better.  You used your CPAP last night.  You used to be very compliant with your machine.  Please try to get back on track with it.  Please follow-up to see Jinny Blossom, nurse practitioner in 6 months.

## 2020-07-11 DIAGNOSIS — Z961 Presence of intraocular lens: Secondary | ICD-10-CM | POA: Diagnosis not present

## 2020-07-11 DIAGNOSIS — E119 Type 2 diabetes mellitus without complications: Secondary | ICD-10-CM | POA: Diagnosis not present

## 2020-07-11 DIAGNOSIS — H04123 Dry eye syndrome of bilateral lacrimal glands: Secondary | ICD-10-CM | POA: Diagnosis not present

## 2020-07-11 DIAGNOSIS — H35371 Puckering of macula, right eye: Secondary | ICD-10-CM | POA: Diagnosis not present

## 2020-07-11 DIAGNOSIS — H401132 Primary open-angle glaucoma, bilateral, moderate stage: Secondary | ICD-10-CM | POA: Diagnosis not present

## 2020-07-12 ENCOUNTER — Telehealth: Payer: Self-pay

## 2020-07-12 DIAGNOSIS — I951 Orthostatic hypotension: Secondary | ICD-10-CM | POA: Diagnosis not present

## 2020-07-12 DIAGNOSIS — I10 Essential (primary) hypertension: Secondary | ICD-10-CM

## 2020-07-12 MED ORDER — SPIRONOLACTONE 25 MG PO TABS: 12.5000 mg | ORAL_TABLET | Freq: Every day | ORAL | 1 refills | Status: DC

## 2020-07-12 NOTE — Telephone Encounter (Signed)
Patient's daughter [phone]) called regarding pt bp has been very low and would like to make you aware of it and what can she do please advise

## 2020-07-12 NOTE — Telephone Encounter (Signed)
ON-CALL CARDIOLOGY 07/12/20  Patient's name: Casey Liu.   MRN: [id number].    DOB: 03-Nov-1934 Primary care provider: Elwyn Reach, MD. Primary cardiologist: Dr. Einar Gip  Interaction regarding this patient's care today: Patient's daughter called today concerned because patient has been having symptomatic hypotension.  Reports blood pressure of 106/58 today after taking her morning medicines including atenolol and spironolactone.  Patient is presently feeling less dizzy, however is still very fatigued at this time.  At most recent visit with Dr. Einar Gip he had stopped Procardia and hydralazine in view of orthostatic hypotension.  Impression:   ICD-10-CM   1. Orthostatic hypotension  I95.1   2. Essential hypertension  I10 spironolactone (ALDACTONE) 25 MG tablet    Meds ordered this encounter  Medications  . spironolactone (ALDACTONE) 25 MG tablet    Sig: Take 0.5 tablets (12.5 mg total) by mouth daily.    Dispense:  90 tablet    Refill:  1    No orders of the defined types were placed in this encounter.   Recommendations: Advised patient's daughter to hold patient's olmesartan/hydrochlorothiazide today which she usually takes in the afternoon.  Recommend monitoring patient's blood pressure daily and keeping a written log.  Patient will continue to monitor blood pressure today and to notify our office if symptoms worsen or fail to improve.  Recommend patient reduce spironolactone from 25 mg to 12.5 mg starting tomorrow.  Patient continues to experience symptomatic hypotension with spironolactone 12.5 mg daily, recommend she stop the spironolactone and notify our office.  Patient and her daughter both verbalized understanding and agreement.   Will schedule office visit to follow up in 4 weeks.    Telephone encounter total time: 8 minutes     Alethia Berthold, PA-C 07/12/2020, 1:34 PM Office: [phone]

## 2020-07-18 ENCOUNTER — Other Ambulatory Visit: Payer: Self-pay | Admitting: Cardiology

## 2020-07-18 DIAGNOSIS — I10 Essential (primary) hypertension: Secondary | ICD-10-CM

## 2020-08-10 NOTE — Progress Notes (Signed)
Primary Physician/Referring:  Elwyn Reach, MD  Patient ID: Casey Liu, female    DOB: [birth year], 86 y.o.   MRN: [id number]  Chief Complaint  Patient presents with  . Orthostatic hypotension  . Follow-up    HPI    Casey Liu  is a 86 y.o. female  with  hypertension, obstructive sleep apnea on CPAP, WHO group 2 PH, type II diabetes mellitus with stage 3 CKD, hyperlipidemia, history of covid-19 infection March 2021.  She has chronic dyspnea on exertion and very sensitive about her blood pressure changes.   Patient has been unable to tolerate Procardia and hydralazine in the past due to severe orthostatic hypotension.   Patient presents for 4 week follow up of hypertension. Patient called the on-call office service with concerns of symptomatic hypotension, she was advised to hold olmesartan/hydrochlorothiazide for 1 day and reduce spironolactone from 25 mg to 12.5 mg daily. However, patient has discontinued olmesartan/HCTZ altogether and is taking 12.5 mg spironolactone daily. Patient reports dizziness has essentially resolved, only noticing brief episodes of dizziness when she changes positions. Patient brings with her a written log of home blood pressure readings, however, they are not labeled with the date and she is unsure when the readings were taken. Some readings indicate blood pressure to be well controlled at home while others are still mildly elevated.   Past Medical History:  Diagnosis Date  . Arthritis   . Cataract    right  . Diabetes mellitus without complication ([place])   . Family history of anesthesia complication    mother had "three heart attacks after anesthesia"  . Headache(784.0)    hx of  . History of stomach ulcers   . Hypertension    Dr. Jonelle Sidle [phone]  . Pneumonia    hx of   . Seasonal allergies   . Sleep apnea    cpap  . Stroke [facility])    left side weakness  . Urinary tract infection    hx of    Past  Surgical History:  Procedure Laterality Date  . ABDOMINAL HYSTERECTOMY    . APPENDECTOMY    . BLADDER SURGERY    . CATARACT EXTRACTION W/PHACO  07/07/2012   Procedure: CATARACT EXTRACTION PHACO AND INTRAOCULAR LENS PLACEMENT (IOC);  Surgeon: Adonis Brook, MD;  Location: [place];  Service: Ophthalmology;  Laterality: Right;  . CESAREAN SECTION     x 1  . CHOLECYSTECTOMY    . EYE SURGERY     "seven eye surgery"  . KNEE SURGERY     left knee  . OVARIAN CYST REMOVAL    . TOTAL KNEE ARTHROPLASTY Right 11/12/2015   Procedure: TOTAL KNEE ARTHROPLASTY;  Surgeon: Frederik Pear, MD;  Location: [place];  Service: Orthopedics;  Laterality: Right;  . TOTAL KNEE ARTHROPLASTY Left 04/23/2016   Procedure: LEFT TOTAL KNEE ARTHROPLASTY;  Surgeon: Frederik Pear, MD;  Location: [place];  Service: Orthopedics;  Laterality: Left;  . TUBAL LIGATION     Family History  Problem Relation Age of Onset  . Breast cancer Daughter 44  . Healthy Mother   . Dementia Mother   . Healthy Father    Social History   Tobacco Use  . Smoking status: Never Smoker  . Smokeless tobacco: Never Used  Substance Use Topics  . Alcohol use: No    Comment: social hx     Marital Status: Single  ROS   Review of Systems  Constitutional: Negative for malaise/fatigue and weight gain.  Cardiovascular: Positive for dyspnea on exertion. Negative for chest pain, claudication, leg swelling, near-syncope, orthopnea, palpitations, paroxysmal nocturnal dyspnea and syncope.  Respiratory: Negative for shortness of breath.   Hematologic/Lymphatic: Does not bruise/bleed easily.  Musculoskeletal: Positive for arthritis and back pain.  Gastrointestinal: Negative for melena.  Neurological: Positive for dizziness (brief with positional changes). Negative for weakness.   Objective  Blood pressure 136/70, pulse 83, temperature (!) 97.3 F (36.3 C), temperature source Temporal, resp. rate 16, height 5\' 5"  (1.651 m), weight 144 lb (65.3 kg), SpO2 96  %. Body mass index is 23.96 kg/m.  Vitals with BMI 08/13/2020 08/13/2020 05/16/2020  Height - 5\' 5"  5\' 5"   Weight - 144 lbs 140 lbs 8 oz  BMI - 123XX123 123XX123  Systolic [id number]  Diastolic 70 82 80  Pulse - 83 57    Orthostatic VS for the past 72 hrs (Last 3 readings):  Orthostatic BP Patient Position BP Location Cuff Size Orthostatic Pulse  08/13/20 1604 - Sitting Left Arm Normal -  08/13/20 1544 143/73 Standing Left Arm Normal 84  08/13/20 1543 136/73 Sitting Left Arm Normal 82  08/13/20 1542 143/77 Supine Left Arm Normal 79    Physical Exam Vitals reviewed.  Constitutional:      General: She is not in acute distress.    Appearance: She is well-developed.  HENT:     Head: Normocephalic and atraumatic.  Cardiovascular:     Rate and Rhythm: Normal rate and regular rhythm.     Pulses: Intact distal pulses.     Heart sounds: Normal heart sounds, S1 normal and S2 normal. No murmur heard. No gallop.      Comments: No JVD, no edema Pulmonary:     Effort: Pulmonary effort is normal. No respiratory distress.     Breath sounds: Normal breath sounds. No wheezing, rhonchi or rales.  Abdominal:     General: Bowel sounds are normal.     Palpations: Abdomen is soft.  Musculoskeletal:     Right lower leg: No edema.     Left lower leg: No edema.  Neurological:     Mental Status: She is alert.    Laboratory examination:   Recent Labs    09/18/19 0510  NA 140  K 4.8  CL 109  CO2 21*  GLUCOSE 114*  BUN 35*  CREATININE 1.11*  CALCIUM 10.1  GFRNONAA 46*  GFRAA 54*   CrCl cannot be calculated (Patient's most recent lab result is older than the maximum 21 days allowed.). CMP Latest Ref Rng & Units 09/18/2019 02/16/2019 09/29/2018  Glucose 70 - 99 mg/dL 114(H) 124(H) 110(H)  BUN 8 - 23 mg/dL 35(H) 30(H) 25(H)  Creatinine 0.44 - 1.00 mg/dL 1.11(H) 1.15(H) 1.15(H)  Sodium 135 - 145 mmol/L 140 141 138  Potassium 3.5 - 5.1 mmol/L 4.8 4.8 4.0  Chloride 98 - 111 mmol/L 109 104 105   CO2 22 - 32 mmol/L 21(L) 20 23  Calcium 8.9 - 10.3 mg/dL 10.1 10.3 9.1  Total Protein 6.5 - 8.1 g/dL - - 6.8  Total Bilirubin 0.3 - 1.2 mg/dL - - 0.5  Alkaline Phos 38 - 126 U/L - - 61  AST 15 - 41 U/L - - 20  ALT 0 - 44 U/L - - 16   CBC Latest Ref Rng & Units 09/18/2019 09/29/2018 01/31/2018  WBC 4.0 - 10.5 K/uL 7.3 7.5 7.4  Hemoglobin 12.0 - 15.0 g/dL 11.6(L) 11.0(L) 13.9  Hematocrit 36.0 - 46.0 % 36.9 36.3  45.2  Platelets 150 - 400 K/uL 249 317 256   External labs:  05/05/2017: Cholesterol 188, HDL 60, LDL 109, Triglycerides 95.  Medications and Allergies    Allergies  Allergen Reactions  . Celebrex [Celecoxib] Nausea And Vomiting and Palpitations  . Latex Rash   Current Outpatient Medications  Medication Instructions  . acetaminophen (TYLENOL) 650 mg, Oral, Every 6 hours PRN  . aspirin EC 81 mg, Oral, Daily  . atenolol (TENORMIN) 100 mg, Oral, Daily  . metFORMIN (GLUCOPHAGE) 500 mg, Oral, 2 times daily with meals  . Propylene Glycol (SYSTANE BALANCE OP) Ophthalmic, Daily  . spironolactone (ALDACTONE) 12.5 mg, Oral, Daily    Radiology:  No results found.  Cardiac Studies:   Lexiscan Sestamibi 02/07/2011: Normal perfusion without ischemia. Normal LVEF.  Split Night Study 07/24/2016: Obstructive Sleep Apnea    Echocardiogram 02/23/2019:  Normal LV systolic function with EF 54%. Left ventricle cavity is normal in size. Mild concentric hypertrophy of the left ventricle. Normal global wall motion. Doppler evidence of grade I (impaired) diastolic dysfunction, normal LAP. Calculated EF 54%. Left atrial cavity is mildly dilated at 3.9 cm in 4 chamber view. Structurally normal appearing mitral valve. Mild to moderate mitral regurgitation. Moderate tricuspid regurgitation. Mild  to moderate pulmonary hypertension. Estimated pulmonary artery systolic pressure is 41 mm Hg with RA pressure estimated at 3 mm Hg. RVSP measures 27mmHg. No significant change from  Echocardiogram  07/08/2016.   EKG   EKG 08/13/2020: Sinus rhythm at a rate of 70 bpm, left atrial enlargement.  Normal axis. Low-voltage complexes.  Compared to EKG 11/25/2019, left axis deviation.  Assessment     ICD-10-CM   1. Orthostatic hypotension  I95.1 EKG 12-Lead  2. Essential hypertension  I10 spironolactone (ALDACTONE) 25 MG tablet     Meds ordered this encounter  Medications  . spironolactone (ALDACTONE) 25 MG tablet    Sig: Take 0.5 tablets (12.5 mg total) by mouth daily.    Dispense:  90 tablet    Refill:  1   Medications Discontinued During This Encounter  Medication Reason  . Docusate Sodium (DSS) 100 MG CAPS Error  . spironolactone (ALDACTONE) 25 MG tablet   . olmesartan-hydrochlorothiazide (BENICAR HCT) 40-12.5 MG tablet Discontinued by provider    Recommendations:   Casey Liu  is a 86 y.o. female  with  hypertension, obstructive sleep apnea on CPAP, WHO group 2 PH, type II diabetes mellitus with stage 3 CKD, hyperlipidemia.  She has chronic dyspnea on exertion and very sensitive about her blood pressure changes.   Patient presents for 4 week follow up after she called the on-call provider with concerns of symptomatic hypotension. Patient has since reduced spironolactone to 12.5 mg daily and stopped olmesartan-HCTZ completely. Patient reports fatigue has resolved and she now has only occasional brief episodes of dizziness with positional changes. Patient is not orthostatic in the office today. In fact patient blood pressure is mildly elevated. However, it is unclear if home blood pressure readings are well controlled. In view of history of severe hypotension, will not make changes to present antihypertensive medications.   I have provided a dated blood pressure log. Patient will monitor home blood pressure for the next 2 weeks, then our office will call for home BP readings. If blood pressure is uncontrolled can consider reinitiation of olmesartan/HCTZ.   Follow  up in 6 weeks for hypertension. If she remains stable at this time can consider as needed follow up.    Casey Berthold,  PA-C 08/14/2020, 10:26 AM Office: [phone]

## 2020-08-13 ENCOUNTER — Other Ambulatory Visit: Payer: Self-pay

## 2020-08-13 ENCOUNTER — Encounter: Payer: Self-pay | Admitting: Student

## 2020-08-13 ENCOUNTER — Ambulatory Visit: Payer: Medicare Other | Admitting: Student

## 2020-08-13 VITALS — BP 136/70 | HR 83 | Temp 97.3°F | Resp 16 | Ht 65.0 in | Wt 144.0 lb

## 2020-08-13 DIAGNOSIS — I10 Essential (primary) hypertension: Secondary | ICD-10-CM

## 2020-08-13 DIAGNOSIS — I951 Orthostatic hypotension: Secondary | ICD-10-CM

## 2020-08-13 MED ORDER — SPIRONOLACTONE 25 MG PO TABS: 12.5000 mg | ORAL_TABLET | Freq: Every day | ORAL | 1 refills | Status: DC

## 2020-08-28 ENCOUNTER — Telehealth: Payer: Self-pay | Admitting: Student

## 2020-08-28 NOTE — Telephone Encounter (Signed)
Called and spoke with patient's daughter, she did not have patient's blood pressure log accessible at the time. She will call the office back with blood pressure readings tomorrow.

## 2020-09-17 ENCOUNTER — Telehealth: Payer: Self-pay | Admitting: Student

## 2020-09-17 ENCOUNTER — Other Ambulatory Visit: Payer: Self-pay | Admitting: Student

## 2020-09-17 DIAGNOSIS — I10 Essential (primary) hypertension: Secondary | ICD-10-CM

## 2020-09-17 MED ORDER — OLMESARTAN MEDOXOMIL 40 MG PO TABS: 40.0000 mg | ORAL_TABLET | Freq: Every day | ORAL | 11 refills | Status: DC

## 2020-09-17 NOTE — Telephone Encounter (Signed)
Patient blood pressure remains high.  Please call patient's daughter and advise her to start patient on olmesartan 40 mg daily with repeat BMP in 1 week.  Prescription has been sent.  Advised patient to continue to monitor her blood pressure on a daily basis.

## 2020-09-19 ENCOUNTER — Telehealth: Payer: Self-pay

## 2020-09-19 NOTE — Telephone Encounter (Signed)
done

## 2020-09-24 ENCOUNTER — Ambulatory Visit: Payer: Medicare Other | Admitting: Student

## 2020-09-27 LAB — BASIC METABOLIC PANEL
BUN/Creatinine Ratio: 29 — ABNORMAL HIGH (ref 12–28)
BUN: 32 mg/dL — ABNORMAL HIGH (ref 8–27)
CO2: 21 mmol/L (ref 20–29)
Calcium: 10.6 mg/dL — ABNORMAL HIGH (ref 8.7–10.3)
Chloride: 104 mmol/L (ref 96–106)
Creatinine, Ser: 1.09 mg/dL — ABNORMAL HIGH (ref 0.57–1.00)
Glucose: 122 mg/dL — ABNORMAL HIGH (ref 65–99)
Potassium: 4.3 mmol/L (ref 3.5–5.2)
Sodium: 141 mmol/L (ref 134–144)
eGFR: 50 mL/min/{1.73_m2} — ABNORMAL LOW (ref >59)

## 2020-09-27 NOTE — Telephone Encounter (Signed)
Lease inform patient or her daughter that kidney function is stable.

## 2020-09-28 NOTE — Telephone Encounter (Signed)
Called and spoke with patient regarding her lab results.

## 2020-09-28 NOTE — Telephone Encounter (Signed)
Called pt no answer, left a vm to return call back.

## 2020-10-02 NOTE — Progress Notes (Signed)
Primary Physician/Referring:  Elwyn Reach, MD  Patient ID: Casey Liu, female    DOB: 1935/06/18, 86 y.o.   MRN: [id number]  Chief Complaint  Patient presents with  . Hypertension  . Follow-up    6 week    HPI    Casey Liu  is a 86 y.o. female  with  hypertension, obstructive sleep apnea on CPAP, WHO group 2 PH, type II diabetes mellitus with stage 3 CKD, hyperlipidemia, history of covid-19 infection March 2021.  She has chronic dyspnea on exertion and very sensitive about her blood pressure changes.   Patient has been unable to tolerate Procardia and hydralazine in the past due to severe orthostatic hypotension.  Patient presents for follow-up of hypertension.  Since last visit have added olmesartan 40 mg daily, renal function has remained stable. She has had no recurrence of dizziness. She brings with her a written log of blood pressure readings. Her blood pressure remains uncontrolled. She is accompanied by her son-in-law who acts as Optometrist.   Patient denies chest pain, palpitations, dizziness, syncope, near-syncope.   Past Medical History:  Diagnosis Date  . Arthritis   . Cataract    right  . Diabetes mellitus without complication ([place])   . Family history of anesthesia complication    mother had "three heart attacks after anesthesia"  . Headache(784.0)    hx of  . History of stomach ulcers   . Hypertension    Dr. Jonelle Sidle [phone]  . Pneumonia    hx of   . Seasonal allergies   . Sleep apnea    cpap  . Stroke [facility])    left side weakness  . Urinary tract infection    hx of    Past Surgical History:  Procedure Laterality Date  . ABDOMINAL HYSTERECTOMY    . APPENDECTOMY    . BLADDER SURGERY    . CATARACT EXTRACTION W/PHACO  07/07/2012   Procedure: CATARACT EXTRACTION PHACO AND INTRAOCULAR LENS PLACEMENT (IOC);  Surgeon: Adonis Brook, MD;  Location: [place];  Service: Ophthalmology;  Laterality: Right;  . CESAREAN  SECTION     x 1  . CHOLECYSTECTOMY    . EYE SURGERY     "seven eye surgery"  . KNEE SURGERY     left knee  . OVARIAN CYST REMOVAL    . TOTAL KNEE ARTHROPLASTY Right 11/12/2015   Procedure: TOTAL KNEE ARTHROPLASTY;  Surgeon: Frederik Pear, MD;  Location: [place];  Service: Orthopedics;  Laterality: Right;  . TOTAL KNEE ARTHROPLASTY Left 04/23/2016   Procedure: LEFT TOTAL KNEE ARTHROPLASTY;  Surgeon: Frederik Pear, MD;  Location: [place];  Service: Orthopedics;  Laterality: Left;  . TUBAL LIGATION     Family History  Problem Relation Age of Onset  . Breast cancer Daughter 49  . Healthy Mother   . Dementia Mother   . Healthy Father    Social History   Tobacco Use  . Smoking status: Never Smoker  . Smokeless tobacco: Never Used  Substance Use Topics  . Alcohol use: No    Comment: social hx     Marital Status: Single  ROS   Review of Systems  Constitutional: Negative for malaise/fatigue and weight gain.  Cardiovascular: Positive for dyspnea on exertion. Negative for chest pain, claudication, leg swelling, near-syncope, orthopnea, palpitations, paroxysmal nocturnal dyspnea and syncope.  Respiratory: Negative for shortness of breath.   Hematologic/Lymphatic: Does not bruise/bleed easily.  Musculoskeletal: Positive for arthritis and back pain.  Gastrointestinal: Negative for  melena.  Neurological: Negative for dizziness and weakness.   Objective  Blood pressure (!) 146/92, pulse 60, temperature 97.8 F (36.6 C), resp. rate 17, height 5\' 5"  (1.651 m), weight 142 lb (64.4 kg), SpO2 100 %. Body mass index is 23.63 kg/m.  Vitals with BMI 10/03/2020 10/03/2020 08/13/2020  Height - 5\' 5"  -  Weight - 142 lbs -  BMI - 18.29 -  Systolic 937 169 678  Diastolic 92 89 70  Pulse 60 60 -    Orthostatic VS for the past 72 hrs (Last 3 readings):  Patient Position BP Location Cuff Size  10/03/20 1445 Sitting Left Arm Normal  10/03/20 1440 Sitting Left Arm Normal    Physical Exam Vitals  reviewed.  Constitutional:      General: She is not in acute distress.    Appearance: She is well-developed.  HENT:     Head: Normocephalic and atraumatic.  Cardiovascular:     Rate and Rhythm: Normal rate and regular rhythm.     Pulses: Intact distal pulses.     Heart sounds: Normal heart sounds, S1 normal and S2 normal. No murmur heard. No gallop.      Comments: No JVD, no edema Pulmonary:     Effort: Pulmonary effort is normal. No respiratory distress.     Breath sounds: Normal breath sounds. No wheezing, rhonchi or rales.  Abdominal:     General: Bowel sounds are normal.     Palpations: Abdomen is soft.  Musculoskeletal:     Right lower leg: No edema.     Left lower leg: No edema.  Neurological:     General: No focal deficit present.     Mental Status: She is alert and oriented to person, place, and time.  Psychiatric:        Mood and Affect: Mood normal.        Behavior: Behavior normal.    Laboratory examination:   Recent Labs    09/26/20 0818  NA 141  K 4.3  CL 104  CO2 21  GLUCOSE 122*  BUN 32*  CREATININE 1.09*  CALCIUM 10.6*   Estimated Creatinine Clearance: 35.2 mL/min (A) (by C-G formula based on SCr of 1.09 mg/dL (H)). CMP Latest Ref Rng & Units 09/26/2020 09/18/2019 02/16/2019  Glucose 65 - 99 mg/dL 122(H) 114(H) 124(H)  BUN 8 - 27 mg/dL 32(H) 35(H) 30(H)  Creatinine 0.57 - 1.00 mg/dL 1.09(H) 1.11(H) 1.15(H)  Sodium 134 - 144 mmol/L 141 140 141  Potassium 3.5 - 5.2 mmol/L 4.3 4.8 4.8  Chloride 96 - 106 mmol/L 104 109 104  CO2 20 - 29 mmol/L 21 21(L) 20  Calcium 8.7 - 10.3 mg/dL 10.6(H) 10.1 10.3  Total Protein 6.5 - 8.1 g/dL - - -  Total Bilirubin 0.3 - 1.2 mg/dL - - -  Alkaline Phos 38 - 126 U/L - - -  AST 15 - 41 U/L - - -  ALT 0 - 44 U/L - - -   CBC Latest Ref Rng & Units 09/18/2019 09/29/2018 01/31/2018  WBC 4.0 - 10.5 K/uL 7.3 7.5 7.4  Hemoglobin 12.0 - 15.0 g/dL 11.6(L) 11.0(L) 13.9  Hematocrit 36.0 - 46.0 % 36.9 36.3 45.2  Platelets 150 -  400 K/uL 249 317 256   External labs:  05/05/2017: Cholesterol 188, HDL 60, LDL 109, Triglycerides 95.  Medications and Allergies    Allergies  Allergen Reactions  . Celebrex [Celecoxib] Nausea And Vomiting and Palpitations  . Latex Rash   Current Outpatient  Medications  Medication Instructions  . acetaminophen (TYLENOL) 650 mg, Oral, Every 6 hours PRN  . aspirin EC 81 mg, Oral, Daily  . atenolol (TENORMIN) 100 mg, Oral, Every evening  . hydrochlorothiazide (MICROZIDE) 12.5 mg, Oral, Daily  . metFORMIN (GLUCOPHAGE) 500 mg, Oral, 2 times daily with meals  . Netarsudil-Latanoprost (ROCKLATAN) 0.02-0.005 % SOLN Daily  . olmesartan (BENICAR) 40 mg, Oral, Daily  . Propylene Glycol (SYSTANE BALANCE OP) Ophthalmic, Daily  . spironolactone (ALDACTONE) 25 mg, Oral, Daily    Radiology:  No results found.  Cardiac Studies:   Lexiscan Sestamibi 02/07/2011: Normal perfusion without ischemia. Normal LVEF.  Split Night Study 07/24/2016: Obstructive Sleep Apnea    Echocardiogram 02/23/2019:  Normal LV systolic function with EF 54%. Left ventricle cavity is normal in size. Mild concentric hypertrophy of the left ventricle. Normal global wall motion. Doppler evidence of grade I (impaired) diastolic dysfunction, normal LAP. Calculated EF 54%. Left atrial cavity is mildly dilated at 3.9 cm in 4 chamber view. Structurally normal appearing mitral valve. Mild to moderate mitral regurgitation. Moderate tricuspid regurgitation. Mild  to moderate pulmonary hypertension. Estimated pulmonary artery systolic pressure is 41 mm Hg with RA pressure estimated at 3 mm Hg. RVSP measures 55mmHg. No significant change from  Echocardiogram 07/08/2016.   EKG   EKG 08/13/2020: Sinus rhythm at a rate of 70 bpm, left atrial enlargement.  Normal axis. Low-voltage complexes.  Compared to EKG 11/25/2019, left axis deviation.  Assessment     ICD-10-CM   1. Essential hypertension  I10 spironolactone (ALDACTONE) 25 MG  tablet    hydrochlorothiazide (MICROZIDE) 12.5 MG capsule    atenolol (TENORMIN) 100 MG tablet    Basic metabolic panel  2. Orthostatic hypotension  I95.1      Meds ordered this encounter  Medications  . spironolactone (ALDACTONE) 25 MG tablet    Sig: Take 1 tablet (25 mg total) by mouth daily.    Dispense:  90 tablet    Refill:  1  . hydrochlorothiazide (MICROZIDE) 12.5 MG capsule    Sig: Take 1 capsule (12.5 mg total) by mouth daily.    Dispense:  90 capsule    Refill:  3  . atenolol (TENORMIN) 100 MG tablet    Sig: Take 1 tablet (100 mg total) by mouth every evening.    Dispense:  90 tablet    Refill:  1   Medications Discontinued During This Encounter  Medication Reason  . spironolactone (ALDACTONE) 25 MG tablet   . atenolol (TENORMIN) 100 MG tablet     Recommendations:   Brenlee Koskela  is a 86 y.o. female  with  hypertension, obstructive sleep apnea on CPAP, WHO group 2 PH, type II diabetes mellitus with stage 3 CKD, hyperlipidemia.  She has chronic dyspnea on exertion and very sensitive about her blood pressure changes.   Patient presents for 6-week follow-up of hypertension since reinitiation of olmesartan 40 mg daily. Patient's blood pressure remains uncontrolled. Will add HCTZ 12.5 mg daily, as she has been on this in the past and tolerated it well. Will repeat BMP in 1 week. Advised patient to continue to monitor her blood pressure and to notify our office if it remains elevated. She does have history of symptomatic hypotension, counseled her to notify us if she experiences this again, she verbalized understanding and agreement. If her blood pressure remains uncontrolled could consider switching atenolol to labetalol.   Patient will bring home BP monitor with her to her next visit because home  readings are consistently elevated compared to office readings, want to check the accuracy of her monitor.   Follow up in 3 months, sooner if needed, for  hypertension, hyperlipidemia, and WHO group 2 PH.    Alethia Berthold, PA-C 10/03/2020, 4:31 PM Office: [phone]

## 2020-10-03 ENCOUNTER — Ambulatory Visit: Payer: Medicare Other | Admitting: Student

## 2020-10-03 ENCOUNTER — Other Ambulatory Visit: Payer: Self-pay

## 2020-10-03 ENCOUNTER — Encounter: Payer: Self-pay | Admitting: Student

## 2020-10-03 VITALS — BP 146/92 | HR 60 | Temp 97.8°F | Resp 17 | Ht 65.0 in | Wt 142.0 lb

## 2020-10-03 DIAGNOSIS — I10 Essential (primary) hypertension: Secondary | ICD-10-CM

## 2020-10-03 DIAGNOSIS — I951 Orthostatic hypotension: Secondary | ICD-10-CM

## 2020-10-03 MED ORDER — SPIRONOLACTONE 25 MG PO TABS: 25.0000 mg | ORAL_TABLET | Freq: Every day | ORAL | 1 refills | Status: DC

## 2020-10-03 MED ORDER — HYDROCHLOROTHIAZIDE 12.5 MG PO CAPS: 12.5000 mg | ORAL_CAPSULE | Freq: Every day | ORAL | 3 refills | Status: DC

## 2020-10-03 MED ORDER — ATENOLOL 100 MG PO TABS: 100.0000 mg | ORAL_TABLET | Freq: Every evening | ORAL | 1 refills | Status: AC

## 2020-10-05 ENCOUNTER — Telehealth: Payer: Self-pay

## 2020-10-05 NOTE — Telephone Encounter (Signed)
Patient's daughter called regarding her mother's medication managements. I went ahead and did a medication reconciliation so her med list is up to date. Daughter had a question as to why her mother is on olmesartan and on her last ov with you you prescribed hydrochlorothiazide by its self. Patient states she spoke to you before and she is administrating her mother's medications as she has been. Daughter also question as to why her mother stop being seen by Dr Einar Gip please advise.   I tried explaining to patient's daughter Dr Einar Gip schedule and also tried to explain why HCTZ was given to her based off your office note

## 2020-10-08 NOTE — Telephone Encounter (Signed)
Called and left a message for patient's daughter to call the office to discuss her questions.

## 2020-10-09 NOTE — Telephone Encounter (Signed)
done

## 2020-11-14 ENCOUNTER — Ambulatory Visit: Payer: Medicare Other | Admitting: Adult Health

## 2020-12-25 ENCOUNTER — Other Ambulatory Visit: Payer: Self-pay | Admitting: Cardiology

## 2020-12-25 DIAGNOSIS — I10 Essential (primary) hypertension: Secondary | ICD-10-CM

## 2021-01-03 ENCOUNTER — Ambulatory Visit: Payer: Medicare Other | Admitting: Student

## 2021-02-03 ENCOUNTER — Emergency Department (HOSPITAL_COMMUNITY)
Admission: EM | Admit: 2021-02-03 | Discharge: 2021-02-03 | Disposition: A | Discharge status: Home / Self Care | Payer: Medicare Other | Attending: Emergency Medicine | Admitting: Emergency Medicine

## 2021-02-03 ENCOUNTER — Other Ambulatory Visit: Payer: Self-pay

## 2021-02-03 ENCOUNTER — Encounter (HOSPITAL_COMMUNITY): Payer: Self-pay

## 2021-02-03 DIAGNOSIS — Z79899 Other long term (current) drug therapy: Secondary | ICD-10-CM | POA: Insufficient documentation

## 2021-02-03 DIAGNOSIS — Z96653 Presence of artificial knee joint, bilateral: Secondary | ICD-10-CM | POA: Insufficient documentation

## 2021-02-03 DIAGNOSIS — I10 Essential (primary) hypertension: Secondary | ICD-10-CM | POA: Diagnosis not present

## 2021-02-03 DIAGNOSIS — Z91040 Latex allergy status: Secondary | ICD-10-CM | POA: Insufficient documentation

## 2021-02-03 DIAGNOSIS — Z7984 Long term (current) use of oral hypoglycemic drugs: Secondary | ICD-10-CM | POA: Insufficient documentation

## 2021-02-03 DIAGNOSIS — Z7982 Long term (current) use of aspirin: Secondary | ICD-10-CM | POA: Diagnosis not present

## 2021-02-03 DIAGNOSIS — R220 Localized swelling, mass and lump, head: Secondary | ICD-10-CM | POA: Diagnosis present

## 2021-02-03 DIAGNOSIS — E119 Type 2 diabetes mellitus without complications: Secondary | ICD-10-CM | POA: Diagnosis not present

## 2021-02-03 DIAGNOSIS — N179 Acute kidney failure, unspecified: Secondary | ICD-10-CM | POA: Diagnosis not present

## 2021-02-03 LAB — CBC WITH DIFFERENTIAL/PLATELET
Abs Immature Granulocytes: 0.02 10*3/uL (ref 0.00–0.07)
Basophils Absolute: 0 10*3/uL (ref 0.0–0.1)
Basophils Relative: 1 %
Eosinophils Absolute: 0.1 10*3/uL (ref 0.0–0.5)
Eosinophils Relative: 1 %
HCT: 38.4 % (ref 36.0–46.0)
Hemoglobin: 12.1 g/dL (ref 12.0–15.0)
Immature Granulocytes: 0 %
Lymphocytes Relative: 38 %
Lymphs Abs: 1.9 10*3/uL (ref 0.7–4.0)
MCH: 30.9 pg (ref 26.0–34.0)
MCHC: 31.5 g/dL (ref 30.0–36.0)
MCV: 98 fL (ref 80.0–100.0)
Monocytes Absolute: 0.5 10*3/uL (ref 0.1–1.0)
Monocytes Relative: 10 %
Neutro Abs: 2.4 10*3/uL (ref 1.7–7.7)
Neutrophils Relative %: 50 %
Platelets: 198 10*3/uL (ref 150–400)
RBC: 3.92 MIL/uL (ref 3.87–5.11)
RDW: 14.6 % (ref 11.5–15.5)
WBC: 5 10*3/uL (ref 4.0–10.5)
nRBC: 0 % (ref 0.0–0.2)

## 2021-02-03 LAB — URINALYSIS, ROUTINE W REFLEX MICROSCOPIC
Bilirubin Urine: NEGATIVE
Glucose, UA: NEGATIVE mg/dL
Hgb urine dipstick: NEGATIVE
Ketones, ur: NEGATIVE mg/dL
Nitrite: NEGATIVE
Protein, ur: NEGATIVE mg/dL
Specific Gravity, Urine: 1.02 (ref 1.005–1.030)
pH: 7 (ref 5.0–8.0)

## 2021-02-03 LAB — BASIC METABOLIC PANEL
Anion gap: 6 (ref 5–15)
BUN: 43 mg/dL — ABNORMAL HIGH (ref 8–23)
CO2: 25 mmol/L (ref 22–32)
Calcium: 9.5 mg/dL (ref 8.9–10.3)
Chloride: 110 mmol/L (ref 98–111)
Creatinine, Ser: 1.57 mg/dL — ABNORMAL HIGH (ref 0.44–1.00)
GFR, Estimated: 33 mL/min — ABNORMAL LOW (ref >60)
Glucose, Bld: 104 mg/dL — ABNORMAL HIGH (ref 70–99)
Potassium: 5.4 mmol/L — ABNORMAL HIGH (ref 3.5–5.1)
Sodium: 141 mmol/L (ref 135–145)

## 2021-02-03 MED ORDER — DEXAMETHASONE SODIUM PHOSPHATE 10 MG/ML IJ SOLN
10.0000 mg | Freq: Once | INTRAMUSCULAR | Status: AC
  Administered 2021-02-03: 10 mg via INTRAVENOUS
  Filled 2021-02-03: qty 1

## 2021-02-03 MED ORDER — FAMOTIDINE IN NACL 20-0.9 MG/50ML-% IV SOLN
20.0000 mg | Freq: Once | INTRAVENOUS | Status: AC
  Administered 2021-02-03: 20 mg via INTRAVENOUS
  Filled 2021-02-03: qty 50

## 2021-02-03 MED ORDER — PREDNISONE 20 MG PO TABS: 40.0000 mg | ORAL_TABLET | Freq: Every day | ORAL | 0 refills | Status: AC

## 2021-02-03 NOTE — ED Triage Notes (Signed)
Pt BIB EMS from home. Pt reports tongue and throat swelling x1 hours ago. Pt was eating chicken wings and then began swelling.   50mg  Benadryl PO  BP 109/67

## 2021-02-03 NOTE — ED Provider Notes (Signed)
[facility] DEPT Provider Note   CSN: [id number] Arrival date & time: 02/03/21  1606     History Chief Complaint  Patient presents with   Allergic Reaction    Casey Liu is a 86 y.o. female with a history of diabetes mellitus, hypertension, CVA with residual left-sided weakness.  Patient presents emergency department today with a chief complaint of tongue swelling.  Patient states that symptoms started today after she finished eating spicy chicken wings.  Patient felt her tongue swelling and started to develop shortness of breath difficulty swallowing.  Patient ports that she had difficulty speaking due to her tongue swelling.  Patient called family numbers who then brought her to the emergency department.  Patient had 50 mg of Benadryl prior to arrival in emergency department.  Patient reports improvement initially after taking Benadryl.  Feels her tongue is still swollen however denies any difficulty speaking, difficulty swallowing, or difficulty breathing at this time.  Patient denies any known history of food allergies.  Patient is not on lisinopril medication.  Patient denies any facial asymmetry, slurred speech, numbness, weakness, trismus, nausea, vomiting, diarrhea, rash.  Interview was conducted using Spanish interpreter.    Allergic Reaction Presenting symptoms: difficulty swallowing   Presenting symptoms: no rash        Past Medical History:  Diagnosis Date   Arthritis    Cataract    right   Diabetes mellitus without complication ([place])    Family history of anesthesia complication    mother had "three heart attacks after anesthesia"   Headache(784.0)    hx of   History of stomach ulcers    Hypertension    Dr. Jonelle Sidle [phone]   Pneumonia    hx of    Seasonal allergies    Sleep apnea    cpap   Stroke [facility])    left side weakness   Urinary tract infection    hx of    Patient Active Problem List    Diagnosis Date Noted   Primary osteoarthritis of left knee 04/23/2016   Primary localized osteoarthritis of left knee 04/23/2016   Primary osteoarthritis of right knee 11/11/2015   Prolapse of female pelvic organs 08/16/2012   Vaginitis 08/16/2012    Past Surgical History:  Procedure Laterality Date   ABDOMINAL HYSTERECTOMY     APPENDECTOMY     BLADDER SURGERY     CATARACT EXTRACTION W/PHACO  07/07/2012   Procedure: CATARACT EXTRACTION PHACO AND INTRAOCULAR LENS PLACEMENT ([facility]);  Surgeon: Adonis Brook, MD;  Location: [facility];  Service: Ophthalmology;  Laterality: Right;   CESAREAN SECTION     x 1   CHOLECYSTECTOMY     EYE SURGERY     "seven eye surgery"   KNEE SURGERY     left knee   OVARIAN CYST REMOVAL     TOTAL KNEE ARTHROPLASTY Right 11/12/2015   Procedure: TOTAL KNEE ARTHROPLASTY;  Surgeon: Frederik Pear, MD;  Location: [facility];  Service: Orthopedics;  Laterality: Right;   TOTAL KNEE ARTHROPLASTY Left 04/23/2016   Procedure: LEFT TOTAL KNEE ARTHROPLASTY;  Surgeon: Frederik Pear, MD;  Location: [facility];  Service: Orthopedics;  Laterality: Left;   TUBAL LIGATION       OB History   No obstetric history on file.     Family History  Problem Relation Age of Onset   Breast cancer Daughter 7   Healthy Mother    Dementia Mother    Healthy Father     Social History  Tobacco Use   Smoking status: Never   Smokeless tobacco: Never  Substance Use Topics   Alcohol use: No    Comment: social hx    Drug use: No    Home Medications Prior to Admission medications   Medication Sig Start Date End Date Taking? Authorizing Provider  acetaminophen (TYLENOL) 325 MG tablet Take 650 mg by mouth every 6 (six) hours as needed for mild pain or headache.    [provider]  aspirin EC 81 MG tablet Take 81 mg by mouth daily.    [provider]  atenolol (TENORMIN) 100 MG tablet Take 1 tablet (100 mg total) by mouth every evening. 10/03/20   Cantwell, Celeste C, PA-C   hydrochlorothiazide (MICROZIDE) 12.5 MG capsule Take 1 capsule (12.5 mg total) by mouth daily. 10/03/20 09/28/21  Cantwell, Celeste C, PA-C  metFORMIN (GLUCOPHAGE) 500 MG tablet Take 500 mg by mouth 2 (two) times daily with a meal.     [provider]  Netarsudil-Latanoprost (ROCKLATAN) 0.02-0.005 % SOLN daily.    [provider]  olmesartan (BENICAR) 40 MG tablet Take 1 tablet (40 mg total) by mouth daily. 09/17/20 09/17/21  Cantwell, Celeste C, PA-C  Propylene Glycol (SYSTANE BALANCE OP) Apply to eye daily.    [provider]  spironolactone (ALDACTONE) 25 MG tablet TAKE 1 TABLET BY MOUTH EVERY DAY 12/26/20   Cantwell, Celeste C, PA-C    Allergies    Celebrex [celecoxib] and Latex  Review of Systems   Review of Systems  Constitutional:  Negative for chills and fever.  HENT:  Positive for trouble swallowing. Negative for drooling and sore throat.   Eyes:  Negative for visual disturbance.  Respiratory:  Positive for shortness of breath. Negative for choking.   Cardiovascular:  Negative for chest pain.  Gastrointestinal:  Negative for abdominal pain, diarrhea, nausea and vomiting.  Musculoskeletal:  Negative for back pain, neck pain and neck stiffness.  Skin:  Negative for color change, pallor, rash and wound.  Allergic/Immunologic: Negative for immunocompromised state.  Neurological:  Negative for dizziness, tremors, seizures, syncope, facial asymmetry, speech difficulty, weakness, light-headedness, numbness and headaches.  Psychiatric/Behavioral:  Negative for confusion.    Physical Exam Updated Vital Signs BP 129/70   Pulse 60   Temp 98 F (36.7 C) (Oral)   Resp 18   SpO2 97%   Physical Exam Vitals and nursing note reviewed.  Constitutional:      General: She is not in acute distress.    Appearance: She is not ill-appearing, toxic-appearing or diaphoretic.  HENT:     Head: Normocephalic.     Jaw: No trismus or pain on movement.     Mouth/Throat:      Mouth: Mucous membranes are moist. No injury, lacerations or angioedema.     Pharynx: Oropharynx is clear. Uvula midline. No pharyngeal swelling, oropharyngeal exudate, posterior oropharyngeal erythema or uvula swelling.     Comments: Patient was tongue is slightly swollen.  No angioedema noted.  Patient able to handle oral secretions without difficulty. Eyes:     General: No scleral icterus.       Right eye: No discharge.        Left eye: No discharge.     Extraocular Movements: Extraocular movements intact.     Pupils: Pupils are equal, round, and reactive to light.  Cardiovascular:     Rate and Rhythm: Normal rate.  Pulmonary:     Effort: Pulmonary effort is normal. No tachypnea, bradypnea or respiratory  distress.     Breath sounds: Normal breath sounds. No stridor.  Musculoskeletal:     Cervical back: Normal range of motion and neck supple. No rigidity.  Skin:    General: Skin is warm and dry.  Neurological:     General: No focal deficit present.     Mental Status: She is alert.     GCS: GCS eye subscore is 4. GCS verbal subscore is 5. GCS motor subscore is 6.     Cranial Nerves: No cranial nerve deficit or facial asymmetry.     Sensory: Sensation is intact.     Motor: No weakness, tremor, seizure activity or pronator drift.     Coordination: Finger-Nose-Finger Test normal.     Comments: CN II-XII intact; performed in supine position, +5 strength to bilateral upper extremities, +5 strength to dorsiflexion and plantarflexion, patient able to left both legs against gravity and hold each there without difficulty.  Sensation to light touch intact to bilateral upper and lower extremities.  Psychiatric:        Behavior: Behavior is cooperative.    ED Results / Procedures / Treatments   Labs (all labs ordered are listed, but only abnormal results are displayed) Labs Reviewed - No data to display  EKG None  Radiology No results found.  Procedures Procedures   Medications  Ordered in ED Medications  famotidine (PEPCID) IVPB 20 mg premix (0 mg Intravenous Stopped 02/03/21 1811)  dexamethasone (DECADRON) injection 10 mg (10 mg Intravenous Given 02/03/21 1736)    ED Course  I have reviewed the triage vital signs and the nursing notes.  Pertinent labs & imaging results that were available during my care of the patient were reviewed by me and considered in my medical decision making (see chart for details).    MDM Rules/Calculators/A&P                          Alert 86 year old female no acute distress, nontoxic appearing.  Presents to the emerged part with a chief complaint of tongue swelling.  Patient able to speak in full clear sentences without difficulty.  Able to handle oral secretions without difficulty.  Lungs clear to auscultation bilaterally without stridor.  No rash or hives noted.  Tongue appears swollen.  Suspect possible allergic reaction.  The patient Decadron and famotidine and reassess.  On serial reassessment patient reports improvement in swelling.  He continues to have no trouble handling oral secretions or speaking in full complete sentences.  Patient reported dysuria to RN.  Will obtain urinalysis and basic lab work. Urinalysis shows bacteria rare, RBC 11-20, leukocytes large, nitrite negative, RBC 0-5, squamous epithelial 6-10. CBC is unremarkable CMP shows creatinine elevated 1.57 BUN elevated 43.  We will have patient follow-up with primary care provider closely for repeat examination of creatinine and BUN.  Prescribed patient with 5-day course of prednisone to help alleviate symptoms.  Patient given strict return precautions.  Patient expressed understanding of all instructions and scoliosis plan.  Patient was discussed with and evaluated by Dr. Jeanell Sparrow.    Final Clinical Impression(s) / ED Diagnoses Final diagnoses:  Tongue swelling  AKI (acute kidney injury) ([place])    Rx / DC Orders ED Discharge Orders          Ordered     predniSONE (DELTASONE) 20 MG tablet  Daily with breakfast        02/03/21 1935  Loni Beckwith, PA-C 02/03/21 2243    Pattricia Boss, MD 02/04/21 [id number]

## 2021-02-03 NOTE — Discharge Instructions (Addendum)
You came to the emergency department today to be evaluated for your tongue swelling.  Your physical exam was reassuring.  Due to your tongue swelling we have started you on a medicine prednisone.  Please take this once a day for the next 5 days.  Please take Ibuprofen (Advil, motrin) and Tylenol (acetaminophen) to relieve your pain.    You may take up to 600 MG (3 pills) of normal strength ibuprofen every 8 hours as needed.   You make take tylenol, up to 1,000 mg (two extra strength pills) every 8 hours as needed.   It is safe to take ibuprofen and tylenol at the same time as they work differently.   Do not take more than 3,000 mg tylenol in a 24 hour period (not more than one dose every 8 hours.  Please check all medication labels as many medications such as pain and cold medications may contain tylenol.  Do not drink alcohol while taking these medications.  Do not take other NSAID'S while taking ibuprofen (such as aleve or naproxen).  Please take ibuprofen with food to decrease stomach upset.  You also reported painful urination.  Your urine showed no signs of a urinary tract infection.  Your lab work did show that your kidney function was slightly elevated.  You need to follow-up with your primary care provider about this.  Get help right away if: Your mouth, tongue, or lips get very swollen. Your swelling becomes worse. You have trouble breathing. You have trouble swallowing. You have trouble talking. You have chest pain or you feel dizzy. You faint.

## 2021-04-16 ENCOUNTER — Ambulatory Visit: Payer: Medicare Other | Admitting: Neurology

## 2021-04-17 ENCOUNTER — Ambulatory Visit: Payer: Medicare Other | Admitting: Neurology

## 2021-04-23 DIAGNOSIS — E119 Type 2 diabetes mellitus without complications: Secondary | ICD-10-CM | POA: Insufficient documentation

## 2021-04-23 DIAGNOSIS — Z961 Presence of intraocular lens: Secondary | ICD-10-CM | POA: Insufficient documentation

## 2021-04-23 DIAGNOSIS — H3581 Retinal edema: Secondary | ICD-10-CM | POA: Insufficient documentation

## 2021-04-23 DIAGNOSIS — H209 Unspecified iridocyclitis: Secondary | ICD-10-CM | POA: Insufficient documentation

## 2021-04-23 DIAGNOSIS — H401132 Primary open-angle glaucoma, bilateral, moderate stage: Secondary | ICD-10-CM | POA: Insufficient documentation

## 2021-05-08 ENCOUNTER — Ambulatory Visit (INDEPENDENT_AMBULATORY_CARE_PROVIDER_SITE_OTHER): Payer: Medicare Other | Admitting: Podiatry

## 2021-05-08 ENCOUNTER — Other Ambulatory Visit: Payer: Self-pay

## 2021-05-08 DIAGNOSIS — R202 Paresthesia of skin: Secondary | ICD-10-CM | POA: Diagnosis not present

## 2021-05-08 DIAGNOSIS — R200 Anesthesia of skin: Secondary | ICD-10-CM

## 2021-05-15 NOTE — Progress Notes (Signed)
Subjective:  Patient ID: Casey Liu, female    DOB: 1935-03-01,  MRN: [id number]  Chief Complaint  Patient presents with   Foot Pain    Right foot pain    86 y.o. female presents with the above complaint.  Patient presents with complaint of right leg numbness and tingling that is starting and going all across the hip and the legs.  Patient states is only having problem when she is walking on her foot and feels like the right side is weaker.  She cannot feel her toes and has progressed to gotten worse.  She has some history of back arthritis and back problems.  She wanted to discuss this further she has not seen anyone else prior to seeing me.  She has not been tested for neuropathy.  She has not seen anyone for lower back pain.   Review of Systems: Negative except as noted in the HPI. Denies N/V/F/Ch.  Past Medical History:  Diagnosis Date   Arthritis    Cataract    right   Diabetes mellitus without complication ([place])    Family history of anesthesia complication    mother had "three heart attacks after anesthesia"   Headache(784.0)    hx of   History of stomach ulcers    Hypertension    Dr. Jonelle Sidle [phone]   Pneumonia    hx of    Seasonal allergies    Sleep apnea    cpap   Stroke ([place])    left side weakness   Urinary tract infection    hx of    Current Outpatient Medications:    acetaminophen (TYLENOL) 325 MG tablet, Take 650 mg by mouth every 6 (six) hours as needed for mild pain or headache., Disp: , Rfl:    aspirin EC 81 MG tablet, Take 81 mg by mouth daily., Disp: , Rfl:    atenolol (TENORMIN) 100 MG tablet, Take 1 tablet (100 mg total) by mouth every evening., Disp: 90 tablet, Rfl: 1   hydrochlorothiazide (MICROZIDE) 12.5 MG capsule, Take 1 capsule (12.5 mg total) by mouth daily., Disp: 90 capsule, Rfl: 3   metFORMIN (GLUCOPHAGE) 500 MG tablet, Take 500 mg by mouth 2 (two) times daily with a meal. , Disp: , Rfl:    Netarsudil-Latanoprost  (ROCKLATAN) 0.02-0.005 % SOLN, daily., Disp: , Rfl:    olmesartan (BENICAR) 40 MG tablet, Take 1 tablet (40 mg total) by mouth daily., Disp: 30 tablet, Rfl: 11   Propylene Glycol (SYSTANE BALANCE OP), Apply to eye daily., Disp: , Rfl:    spironolactone (ALDACTONE) 25 MG tablet, TAKE 1 TABLET BY MOUTH EVERY DAY, Disp: 90 tablet, Rfl: 1  Social History   Tobacco Use  Smoking Status Never  Smokeless Tobacco Never    Allergies  Allergen Reactions   Celebrex [Celecoxib] Nausea And Vomiting and Palpitations   Latex Rash   Objective:  There were no vitals filed for this visit. There is no height or weight on file to calculate BMI. Constitutional Well developed. Well nourished.  Vascular Dorsalis pedis pulses palpable bilaterally. Posterior tibial pulses palpable bilaterally. Capillary refill normal to all digits.  No cyanosis or clubbing noted. Pedal hair growth normal.  Neurologic Normal speech. Oriented to person, place, and time. Numbness or tingling noted to both lower extremity right greater than left side.  No compression/positive Tinel's sign noted to tarsal tunnel, and common peroneal peroneal nerve.  Dermatologic Nails well groomed and normal in appearance. No open wounds. No skin lesions.  Orthopedic: Manual muscle testing 4 out of 5 bilaterally   Radiographs: None Assessment:   1. Numbness and tingling    Plan:  Patient was evaluated and treated and all questions answered.  Numbness tingling to right lower extremity with history of back pain -I explained to the patient the etiology of numbness tingling and various treatment options were extensively discussed.  I encouraged her to follow-up with a spinal specialist to rule out osteoarthritis versus stenosis leading to numbness tingling in her toes.  Patient may also benefit from nerve conduction study to look at compression even though clinically I am not able to appreciate positive Tinel's sign upon  palpation. -Conduction study was ordered  No follow-ups on file.

## 2021-06-22 ENCOUNTER — Encounter: Payer: Self-pay | Admitting: Neurology

## 2021-06-27 ENCOUNTER — Telehealth: Payer: Self-pay | Admitting: Neurology

## 2021-06-27 ENCOUNTER — Other Ambulatory Visit: Payer: Self-pay

## 2021-06-27 ENCOUNTER — Encounter: Payer: Self-pay | Admitting: Neurology

## 2021-06-27 ENCOUNTER — Ambulatory Visit (INDEPENDENT_AMBULATORY_CARE_PROVIDER_SITE_OTHER): Payer: Medicare Other | Admitting: Neurology

## 2021-06-27 VITALS — BP 143/78 | HR 60 | Ht 62.0 in | Wt 146.0 lb

## 2021-06-27 DIAGNOSIS — Z9989 Dependence on other enabling machines and devices: Secondary | ICD-10-CM

## 2021-06-27 DIAGNOSIS — F439 Reaction to severe stress, unspecified: Secondary | ICD-10-CM

## 2021-06-27 DIAGNOSIS — G4733 Obstructive sleep apnea (adult) (pediatric): Secondary | ICD-10-CM | POA: Diagnosis not present

## 2021-06-27 DIAGNOSIS — Z789 Other specified health status: Secondary | ICD-10-CM

## 2021-06-27 DIAGNOSIS — G20 Parkinson's disease: Secondary | ICD-10-CM | POA: Diagnosis not present

## 2021-06-27 DIAGNOSIS — R413 Other amnesia: Secondary | ICD-10-CM | POA: Diagnosis not present

## 2021-06-27 MED ORDER — CARBIDOPA-LEVODOPA 25-100 MG PO TABS: 0.5000 | ORAL_TABLET | Freq: Three times a day (TID) | ORAL | 3 refills | Status: DC

## 2021-06-27 NOTE — Patient Instructions (Signed)
It was nice to see you again today.  I suspect that you have developed Parkinson's disease, affecting your right side. This disease does progress with time. It can affect your balance, your memory, your mood, your bowel and bladder function, your posture, balance and walking and your activities of daily living. However, there are good supportive treatments and symptomatic treatments available, so most patients have a change to a good quality life and life expectancy is not typically altered.   We will continue to monitor your memory and consider medication for your memory loss in the near future.  For now, I want you to work on several things including using your CPAP a little more.  I will change the pressure on your current machine to see if you tolerate the pressure at a lower setting a little better.    Remember to drink plenty of fluid at least 6 glasses (8 oz each), eat healthy meals and do not skip any meals. Try to eat protein with a every meal and eat a healthy snack such as fruit or nuts in between meals. Try to keep a regular sleep-wake schedule and try to exercise daily, particularly in the form of walking, if you can.  Use your cane for safety.  We will start you on a medication for Parkinson's disease: Sinemet (generic name: carbidopa-levodopa) 25/100 mg: Take half a pill twice daily (8 AM and noon) for one week, then half a pill 3 times a day (8 AM, noon, and 4 PM) thereafter.  We will increase this if you tolerate the medication at your next visit.   Please try to take the medication away from you mealtimes, that is, ideally either one hour before or 2 hours after your meal to ensure optimal absorption. The medication can interfere with the protein content of your meal and trying to the protein in your food and therefore not get fully absorbed.  Common side effects reported are: Nausea, vomiting, sedation, confusion, lightheadedness. Rare side effects include hallucinations, severe nausea or  vomiting, diarrhea and significant drop in blood pressure especially when going from lying to standing or from sitting to standing.   Please remember, no medication is without potential side effects and not every medication is right for every patient with PD (Parkinson's disease).   We will do a brain scan, called MRI and call you with the test results. We will have to schedule you for this on a separate date. This test requires authorization from your insurance, and we will take care of the insurance process. We will check blood work today and call you with the test results.

## 2021-06-27 NOTE — Progress Notes (Signed)
Subjective:    Patient ID: Casey Liu is a 86 y.o. female.  HPI    Interim history:   Casey Liu is 86 year old right-handed woman with an underlying medical history of hypertension, pulmonary hypertension, type 2 diabetes, reflux disease, seasonal allergies, history of pneumonia, arthritis, status post left total knee arthroplasty on 04/23/2016, status post right total knee arthroplasty on 11/12/2015, and OSA, who presents for evaluation of her memory loss.  She is referred by her primary care physician, Dr. Jonelle Sidle.  She is accompanied by her daughter, Casey Liu, today, as well as [place] interpreter, Rosemarie Ax.  Her daughter provides most of the history.  I have followed her for her sleep apnea and she was last seen in this office on 05/16/2020, at which time she was unfortunately not compliant with her AutoPap machine.  She had been compliant with her AutoPap in the past.  She stopped using her machine after developing a cough but was motivated to restart it.  She was advised to follow-up in 6 months to see the nurse practitioner.  Today, 06/27/21: I reviewed her AutoPap compliance data for the past 30 days, she has not been consistent with her AutoPap usage, percent use days greater than 4 hours at 27% only.  Average usage for days on treatment of 4 hours and 36 minutes, residual AHI 10.6/h, pressure for the 95th percentile at 11.4 cm with a range of 5 to 15 cm.  She reports that the pressure feels too high.  The patient has had forgetfulness, including forgetting names of familiar people such as her daughter and granddaughter.  Her youngest daughter lives with her, unfortunately, the daughter has had some serious medical issues which has caused a lot of stress in the family.  The patient is daughter who is with her today does become tearful during the visit.  Patient's older daughter is currently staying with them as well, she is from [place].   She does not like to do  much, she does not like to get out of the house much.  They do not let her cook in the house for fear of finding the food.  She has been sleeping okay, does not drink water enough, they try to encourage better hydration.  She had blood work through Dr. Leontine Locket office on 12/13/2020 and I reviewed the results: Cholesterol panel showed total cholesterol of 196, HDL 65, triglycerides 124, LDL 108, CMP showed glucose of 107, BUN 44 which is elevated, creatinine 1.25 which is mildly elevated, sodium 138, potassium elevated at 5.6, AST 12, ALT 8, alk phos 68, A1c 6.0, CBC with differential unremarkable with the exception of mildly/borderline low platelets at 139.  She does not smoke or drink alcohol.  She does not drink caffeine.  Of note, she has had a tremor for the past at least 1 year.  Memory loss has also been ongoing for the past at least 1 year.  No family history of memory loss or Alzheimer's.  Her mom is alive at 19 years old and maternal aunt lived to be 27, no one in particular with significant memory loss.  The patient is 1 of 11 children altogether, she is the oldest.  She has 7 children of her own.   The patient's allergies, current medications, family history, past medical history, past social history, past surgical history and problem list were reviewed and updated as appropriate.    Previously (copied from previous notes for reference):     She saw Ward Givens,  NP, in the interim on 06/16/2017, at which time she was doing well, tolerating therapy and was compliant with CPAP.   She saw Ward Givens on 12/16/2017, at which time her residual AHI was 10.4/h on a pressure of 9 cm.  She was compliant with treatment.  She was changed to AutoPap therapy on a pressure range of 5 to 15 cm.   She saw Ward Givens, NP, on 06/30/2018 at which time her compliance has declined a little bit.  She was reporting allergy symptoms and not using her CPAP all the time.  She had needed a piece of supply from  her DME company which eventually was replaced.  She was advised to continue with treatment.     She was seen by Ward Givens on 02/08/2019, at which time she was suboptimal with her compliance for more than 4 hours.  Residual AHI was at goal on AutoPap.  She did have a higher leak.   I reviewed her AutoPap compliance data for the past 90 days from 02/14/2020 through 05/13/2020, during which time the patient used her machine only 7 days with percent use days greater than 4 hours at 3% only, indicating very low compliance, essentially no usage after 03/12/2020.    I first met her on 07/09/2016 at the request of her cardiologist, at which time she reported a prior diagnosis of obstructive sleep apnea. She had an older CPAP machine. She had some morning headaches. I suggested we proceed with sleep study testing. She had a baseline sleep study, followed by a CPAP titration study. I went over her test results with her in detail today. Her baseline sleep study from 07/24/2016 showed a sleep efficiency of 78.1%, sleep latency 23 minutes, REM latency markedly delayed at 261.5 minutes. She had a decreased percentage of REM sleep, slow-wave sleep was absent, marked increase in stage II sleep. Total AHI was 28.2 per hour, REM AHI 67.8 per hour, supine AHI 30.9 per hour. Average oxygen saturation was 96%, nadir was 73%. She had no significant PLMS. Based on her medical history, her sleep-related complaints and her sleep study results I invited her for a full night CPAP titration study. She had this on 08/14/2016. Sleep efficiency was 59%, sleep latency 9.5 minutes, REM latency markedly prolonged at 319.5 minutes. She had a normal amount of slow-wave sleep, REM sleep was 12.5%. CPAP was titrated from 5 cm to 7 cm. Since she did not have complete elimination of her sleep disordered breathing I suggested a CPAP pressure of 8 cm at home. She did not have any significant PLMS, baseline oxygen saturation was 90%, nadir was  85%.   I reviewed her CPAP compliance data from 11/10/2016 through 12/09/2016, which is a total of 30 days, during which time she used her machine 29 days with percent used days greater than 4 hours at 90%, indicating excellent compliance with an average usage of 6 hours and 35 minutes, residual AHI is slightly suboptimal at 7.2 per hour, leak on the high side with the 95th percentile at 32.6 L/m on a pressure of 8 cm with EPR of 3.      07/09/2016: She was previously diagnosed with obstructive sleep apnea and placed on CPAP therapy. Sleep study testing was over 5 years ago, prior sleep study results are not available for my review. I reviewed your office note from 06/23/2016, which you kindly included.  The CPAP download is not available for my review today. Her machine is about 84 years old  she estimates. She has been using a nasal mask which breaks easily. Prior to that she was prescribed a full face mask which she preferred. With CPAP she has been sleeping better, without CPAP she has a history of loud snoring. She has been waking up with headaches frequently. These are not severe or debilitating, dull and achy typically. She denies any restless leg symptoms but has residual knee pain bilaterally, left more than right. She has a cane available and also a walker but typically uses her cane only. She reports a bedtime of around 9 PM and while she falls asleep fairly quickly, she is often awake right after midnight and has trouble going back to sleep. Sleep was severely disrupted by multiple bathroom breaks, she says that she woke up to the bathroom up to 10 times per night. She has a bedside commode. She is typically out of bed by 7:15 AM. She occasionally naps. Epworth sleepiness score is 9 out of 24, fatigue score is 57 out of 63. She lives with one of her daughters. She has a total of 7 children. She does not smoke or drink alcohol or use caffeine on a daily basis. She has not been able to use her CPAP  for at least 2 weeks. She complains of discomfort with the mask and machine is not working properly.   Her Past Medical History Is Significant For: Past Medical History:  Diagnosis Date   Arthritis    Cataract    right   Diabetes mellitus without complication ([place])    Family history of anesthesia complication    mother had "three heart attacks after anesthesia"   Headache(784.0)    hx of   History of stomach ulcers    Hypertension    Dr. Jonelle Sidle [phone]   Pneumonia    hx of    Seasonal allergies    Sleep apnea    cpap   Stroke ([place])    left side weakness   Urinary tract infection    hx of    Her Past Surgical History Is Significant For: Past Surgical History:  Procedure Laterality Date   ABDOMINAL HYSTERECTOMY     APPENDECTOMY     BLADDER SURGERY     CATARACT EXTRACTION W/PHACO  07/07/2012   Procedure: CATARACT EXTRACTION PHACO AND INTRAOCULAR LENS PLACEMENT ([facility]);  Surgeon: Adonis Brook, MD;  Location: [facility];  Service: Ophthalmology;  Laterality: Right;   CESAREAN SECTION     x 1   CHOLECYSTECTOMY     EYE SURGERY     "seven eye surgery"   KNEE SURGERY     left knee   OVARIAN CYST REMOVAL     TOTAL KNEE ARTHROPLASTY Right 11/12/2015   Procedure: TOTAL KNEE ARTHROPLASTY;  Surgeon: Frederik Pear, MD;  Location: [facility];  Service: Orthopedics;  Laterality: Right;   TOTAL KNEE ARTHROPLASTY Left 04/23/2016   Procedure: LEFT TOTAL KNEE ARTHROPLASTY;  Surgeon: Frederik Pear, MD;  Location: [facility];  Service: Orthopedics;  Laterality: Left;   TUBAL LIGATION      Her Family History Is Significant For: Family History  Problem Relation Age of Onset   Breast cancer Daughter 63   Healthy Mother    Dementia Mother    Healthy Father     Her Social History Is Significant For: Social History   Socioeconomic History   Marital status: Single    Spouse name: Not on file   Number of children: 8   Years of education: Not on file  Highest education level: Not on file   Occupational History   Not on file  Tobacco Use   Smoking status: Never   Smokeless tobacco: Never  Substance and Sexual Activity   Alcohol use: No    Comment: social hx    Drug use: No   Sexual activity: Not on file  Other Topics Concern   Not on file  Social History Narrative   Lives with daughter   Social Determinants of Health   Financial Resource Strain: Not on file  Food Insecurity: Not on file  Transportation Needs: Not on file  Physical Activity: Not on file  Stress: Not on file  Social Connections: Not on file    Her Allergies Are:  Allergies  Allergen Reactions   Celebrex [Celecoxib] Nausea And Vomiting and Palpitations   Latex Rash  :   Her Current Medications Are:  Outpatient Encounter Medications as of 06/27/2021  Medication Sig   acetaminophen (TYLENOL) 325 MG tablet Take 650 mg by mouth every 6 (six) hours as needed for mild pain or headache.   aspirin EC 81 MG tablet Take 81 mg by mouth daily.   atenolol (TENORMIN) 100 MG tablet Take 1 tablet (100 mg total) by mouth every evening.   COMBIGAN 0.2-0.5 % ophthalmic solution Place 1 drop into both eyes daily.   LUMIGAN 0.01 % SOLN Place 1 drop into both eyes at bedtime.   metFORMIN (GLUCOPHAGE) 500 MG tablet Take 500 mg by mouth 2 (two) times daily with a meal.    olmesartan (BENICAR) 40 MG tablet Take 1 tablet (40 mg total) by mouth daily. (Patient taking differently: Take 40 mg by mouth daily. Takes at night as needed for BP high)   prednisoLONE acetate (PRED FORTE) 1 % ophthalmic suspension Place 1 drop into the left eye in the morning and at bedtime.   spironolactone (ALDACTONE) 25 MG tablet TAKE 1 TABLET BY MOUTH EVERY DAY   hydrochlorothiazide (MICROZIDE) 12.5 MG capsule Take 1 capsule (12.5 mg total) by mouth daily. (Patient not taking: Reported on 06/27/2021)   [DISCONTINUED] Netarsudil-Latanoprost (ROCKLATAN) 0.02-0.005 % SOLN daily.   [DISCONTINUED] Propylene Glycol (SYSTANE BALANCE OP) Apply to eye  daily. (Patient not taking: Reported on 06/27/2021)   No facility-administered encounter medications on file as of 06/27/2021.  :  Review of Systems:  Out of a complete 14 point review of systems, all are reviewed and negative with the exception of these symptoms as listed below:  Review of Systems  Neurological:        Patient is here with her daughter for consult for memory difficulty. Has had difficulty with memory for a year. She is forgetting her granddaughter's name and refers to her as "that girl". She tries to use the CPAP at night but states the air is too strong. Daughter states she sleeps a lot during the day, falls asleep "anywhere". Also states she has problems with a nerve in her leg. ESS 8.   Objective:  Neurological Exam  Physical Exam Physical Examination:   Vitals:   06/27/21 0813  BP: (!) 143/78  Pulse: 60    General Examination: The patient is a very pleasant 86 y.o. female in no acute distress. She appears well-developed and well-nourished and well groomed.   HEENT: Normocephalic, atraumatic, pupils are equal, round and reactive to light.  Extraocular tracking is mildly impaired, mild facial masking noted, mild to moderate nuchal rigidity, mildly decreased eye blink rate.  She is status post cataract repairs bilaterally.  There are no carotid bruits on auscultation. Oropharynx exam reveals: mild to moderate mouth dryness. Tongue protrudes centrally and palate elevates symmetrically.    Chest: Clear to auscultation without wheezing, rhonchi or crackles noted.   Heart: S1+S2+0, regular and normal without murmurs, rubs or gallops noted.    Abdomen: Soft, non-tender and non-distended.   Extremities: There is no significant edema in the distal lower extremities bilaterally.  Mild puffiness around the ankles.   Skin: Warm and dry without trophic changes noted. There are no varicose veins.   Musculoskeletal: exam reveals bony step in the right shoulder, limited  range of motion of the right shoulder joint, could be chronic dislocation, reports some discomfort in her right shoulder.   Neurologically:  Mental status: The patient is awake, alert and is good attention, is able to answer questions appropriately but not elaborate in history giving.  History is primarily obtained from her daughter.  Mood is mildly depressed, affect mildly flat.    Cranial nerves II - XII are as described above under HEENT exam. Right shoulder height is decreased compared to left.  Motor exam: Normal bulk, global strength of 4+ out of 5. She has a fairly consistent tremor in the right upper extremity, mild and intermittent resting tremor in the left upper extremity, no lower extremity tremor.  On fine motor testing, she has mild to moderate difficulty with finger taps and hand movements as well as foot taps on the right, foot taps on the moderate side of impairment.  On the left side she is overall doing a little bit better.   Cerebellar testing: No dysmetria or intention tremor.  Sensory exam: intact to light touch in the upper and lower extremities with mild decrease to sensation noted in the distal lower extremities.  Gait, station and balance: She stands with mild difficulty, posture is mildly stooped for age, mild increase in upper back kyphosis.  She walks slowly and cautiously, intermittent mild shuffling, decreased arm swing bilaterally.  She has a single-point cane and can walk a few steps without it.     Assessment and Plan:  In summary, Makayah Pauli is a very pleasant 86 year old female with an underlying medical history of hypertension, pulmonary hypertension, type 2 diabetes, reflux disease, seasonal allergies, history of pneumonia, arthritis, status post left total knee arthroplasty on 04/23/2016, status post right total knee arthroplasty on 11/12/2015, and OSA, who presents for evaluation of her memory loss of approximately 1 to 2 years duration.  She has  developed a tremor in the past 1+ year, she has had forgetfulness and difficulty remembering even familiar names.  On examination, she shows evidence of parkinsonism, she has right-sided lateralization, her memory loss may tie in with this.  She also endorses significant stress and not sleeping with her CPAP machine currently which can also affect her memory function.  I had a long discussion with the patient and her daughter regarding her presentation, symptoms and exam findings. I would like for [place] to focus on her parkinsonism for now, I suggested we start her on symptomatic treatment with generic Sinemet 25-100 mg strength half a pill up to 3 times daily with gradual increase.  We may increase this further to a full pill 3 times daily at the next appointment.  We can do a formal MMSE at the next appointment.  She is encouraged to continue to use her CPAP and get back on it.  She was quite consistent with the usage in the past.  We will reduce the pressure setting at this time to see if she could tolerate get a little bit better, to that end, I will reduce her AutoPap setting to 4 cm to 10 cm from currently 5 to 15 cm.   She had sleep study testing in 2018. She establish CPAP therapy and has been compliant in the past. She stopped using her machine after developing a cough and not feeling well.  We talked about the importance of healthy lifestyle, good nutrition and particularly good hydration with water.  She is encouraged to drink more water as she may have a tendency towards dehydration.  We will do some blood work today and also proceed with a brain MRI without contrast to investigate her memory loss.  Risk factors for Alzheimer's disease are rather low in her case.  She does have some vascular risk factors.  We will plan a follow-up in this clinic for her to see one of our nurse practitioners in about 3 months, sooner if needed.  I answered all their questions today and the patient and her daughter were in  agreement. I spent 40 minutes in total face-to-face time and in reviewing records during pre-charting, more than 50% of which was spent in counseling and coordination of care, reviewing test results, reviewing medications and treatment regimen and/or in discussing or reviewing the diagnosis of memory loss, sleep apnea, parkinsonism, the prognosis and treatment options. Pertinent laboratory and imaging test results that were available during this visit with the patient were reviewed by me and considered in my medical decision making (see chart for details).

## 2021-06-27 NOTE — Telephone Encounter (Signed)
Medicare/medicaid order sent to [facility], [facility] they will reach out to the patient to schedule.  ?

## 2021-06-27 NOTE — Progress Notes (Signed)
RE: cpap Received: Today Denyse Amass, RN got it!      Previous Messages   ----- Message -----  From: Brandon Melnick, RN  Sent: 06/27/2021   1:13 PM EST  To: Ocie Bob, *  Subject: cpap                                           Order in [place] for pt Benitiz=Bonilla Tyashia  DOB 01-Jun-2035.  Thank you SY

## 2021-06-28 LAB — B12 AND FOLATE PANEL
Folate: >20 ng/mL (ref >3.0)
Vitamin B-12: 789 pg/mL (ref 232–1245)

## 2021-06-28 LAB — COMPREHENSIVE METABOLIC PANEL
ALT: 13 IU/L (ref 0–32)
AST: 16 IU/L (ref 0–40)
Albumin/Globulin Ratio: 1.6 (ref 1.2–2.2)
Albumin: 4.6 g/dL (ref 3.6–4.6)
Alkaline Phosphatase: 100 IU/L (ref 44–121)
BUN/Creatinine Ratio: 28 (ref 12–28)
BUN: 38 mg/dL — ABNORMAL HIGH (ref 8–27)
Bilirubin Total: 0.3 mg/dL (ref 0.0–1.2)
CO2: 21 mmol/L (ref 20–29)
Calcium: 10.5 mg/dL — ABNORMAL HIGH (ref 8.7–10.3)
Chloride: 105 mmol/L (ref 96–106)
Creatinine, Ser: 1.36 mg/dL — ABNORMAL HIGH (ref 0.57–1.00)
Globulin, Total: 2.8 g/dL (ref 1.5–4.5)
Glucose: 135 mg/dL — ABNORMAL HIGH (ref 70–99)
Potassium: 5.2 mmol/L (ref 3.5–5.2)
Sodium: 142 mmol/L (ref 134–144)
Total Protein: 7.4 g/dL (ref 6.0–8.5)
eGFR: 38 mL/min/{1.73_m2} — ABNORMAL LOW (ref >59)

## 2021-06-28 LAB — RPR: RPR Ser Ql: NONREACTIVE

## 2021-06-28 LAB — TSH: TSH: 2.09 u[IU]/mL (ref 0.450–4.500)

## 2021-07-01 ENCOUNTER — Ambulatory Visit: Payer: Medicare Other | Admitting: Neurology

## 2021-07-02 ENCOUNTER — Telehealth: Payer: Self-pay | Admitting: *Deleted

## 2021-07-02 NOTE — Telephone Encounter (Signed)
Spoke with pt's daughter Kalyiah Saintil (on [place]) and discussed lab results as noted below by Dr Rexene Alberts.  I advised her to have pt f/u with primary care for blood sugar management and to monitor kidney function. Also advised her to encourage patient to hydrate well with water.  Pt's daughter verbalized understanding and her questions were answered. She verbalized appreciation for the call.  Lab results forwarded to Dr. Jonelle Sidle

## 2021-07-02 NOTE — Telephone Encounter (Signed)
-----   Message from Star Age, MD sent at 07/01/2021 12:21 PM EST ----- [place] interpreter needed or you can talk to patient's daughter, Akansha, who speaks [place].  Blood work shows impaired kidney function which is not a new finding, blood sugar level was mildly elevated as well.  Please advise patient to continue to hydrate well with water and follow-up with primary care for blood sugar management and to monitor kidney function.

## 2021-07-03 ENCOUNTER — Ambulatory Visit: Payer: Medicare Other | Admitting: Podiatry

## 2021-07-05 ENCOUNTER — Ambulatory Visit (INDEPENDENT_AMBULATORY_CARE_PROVIDER_SITE_OTHER): Payer: Medicare Other | Admitting: Podiatry

## 2021-07-05 ENCOUNTER — Other Ambulatory Visit: Payer: Self-pay

## 2021-07-05 DIAGNOSIS — R202 Paresthesia of skin: Secondary | ICD-10-CM | POA: Diagnosis not present

## 2021-07-05 DIAGNOSIS — R200 Anesthesia of skin: Secondary | ICD-10-CM | POA: Diagnosis not present

## 2021-07-07 ENCOUNTER — Other Ambulatory Visit: Payer: Medicare Other

## 2021-07-10 NOTE — Progress Notes (Signed)
Subjective:  Patient ID: Casey Liu, female    DOB: 05-Sep-1934,  MRN: [id number]  Chief Complaint  Patient presents with   Numbness    Pt stated that her feet are still bothering her some     86 y.o. female presents with the above complaint.  Patient presents with complaint of right leg numbness and tingling that is starting and going all across the hip and the legs.  She states is about the same.  She did not get her nerve conduction study.  She went to neurology but they did not do a nerve conduction study.  She will try to get the appointment back again to rediscuss.   Review of Systems: Negative except as noted in the HPI. Denies N/V/F/Ch.  Past Medical History:  Diagnosis Date   Arthritis    Cataract    right   Diabetes mellitus without complication ([place])    Family history of anesthesia complication    mother had "three heart attacks after anesthesia"   Headache(784.0)    hx of   History of stomach ulcers    Hypertension    Dr. Jonelle Sidle [phone]   Pneumonia    hx of    Seasonal allergies    Sleep apnea    cpap   Stroke ([place])    left side weakness   Urinary tract infection    hx of    Current Outpatient Medications:    acetaminophen (TYLENOL) 325 MG tablet, Take 650 mg by mouth every 6 (six) hours as needed for mild pain or headache., Disp: , Rfl:    aspirin EC 81 MG tablet, Take 81 mg by mouth daily., Disp: , Rfl:    atenolol (TENORMIN) 100 MG tablet, Take 1 tablet (100 mg total) by mouth every evening., Disp: 90 tablet, Rfl: 1   carbidopa-levodopa (SINEMET IR) 25-100 MG tablet, Take 0.5 tablets by mouth 3 (three) times daily. Follow titration instructions provided separately in writing., Disp: 90 tablet, Rfl: 3   COMBIGAN 0.2-0.5 % ophthalmic solution, Place 1 drop into both eyes daily., Disp: , Rfl:    hydrochlorothiazide (MICROZIDE) 12.5 MG capsule, Take 1 capsule (12.5 mg total) by mouth daily. (Patient not taking: Reported on 06/27/2021),  Disp: 90 capsule, Rfl: 3   LUMIGAN 0.01 % SOLN, Place 1 drop into both eyes at bedtime., Disp: , Rfl:    metFORMIN (GLUCOPHAGE) 500 MG tablet, Take 500 mg by mouth 2 (two) times daily with a meal. , Disp: , Rfl:    olmesartan (BENICAR) 40 MG tablet, Take 1 tablet (40 mg total) by mouth daily. (Patient taking differently: Take 40 mg by mouth daily. Takes at night as needed for BP high), Disp: 30 tablet, Rfl: 11   prednisoLONE acetate (PRED FORTE) 1 % ophthalmic suspension, Place 1 drop into the left eye in the morning and at bedtime., Disp: , Rfl:    spironolactone (ALDACTONE) 25 MG tablet, TAKE 1 TABLET BY MOUTH EVERY DAY, Disp: 90 tablet, Rfl: 1  Social History   Tobacco Use  Smoking Status Never  Smokeless Tobacco Never    Allergies  Allergen Reactions   Celebrex [Celecoxib] Nausea And Vomiting and Palpitations   Latex Rash   Objective:  There were no vitals filed for this visit. There is no height or weight on file to calculate BMI. Constitutional Well developed. Well nourished.  Vascular Dorsalis pedis pulses palpable bilaterally. Posterior tibial pulses palpable bilaterally. Capillary refill normal to all digits.  No cyanosis or clubbing noted.  Pedal hair growth normal.  Neurologic Normal speech. Oriented to person, place, and time. Numbness or tingling noted to both lower extremity right greater than left side.  No compression/positive Tinel's sign noted to tarsal tunnel, and common peroneal peroneal nerve.  Dermatologic Nails well groomed and normal in appearance. No open wounds. No skin lesions.  Orthopedic: Manual muscle testing 4 out of 5 bilaterally   Radiographs: None Assessment:   No diagnosis found.  Plan:  Patient was evaluated and treated and all questions answered.  Numbness tingling to right lower extremity with history of back pain -I explained to the patient the etiology of numbness tingling and various treatment options were extensively discussed.  I  encouraged her to follow-up with a spinal specialist to rule out osteoarthritis versus stenosis leading to numbness tingling in her toes.  Patient may also benefit from nerve conduction study to look at compression even though clinically I am not able to appreciate positive Tinel's sign upon palpation. -I have asked her to go to [facility] neurology to complete the nerve conduction study to help assess and rule out any kind of compression.  She does have a history of Parkinson which could also be attributing to it as well.  No follow-ups on file.

## 2021-07-17 ENCOUNTER — Other Ambulatory Visit: Payer: Self-pay | Admitting: Student

## 2021-07-17 DIAGNOSIS — I10 Essential (primary) hypertension: Secondary | ICD-10-CM

## 2021-08-08 ENCOUNTER — Other Ambulatory Visit: Payer: Self-pay

## 2021-08-08 ENCOUNTER — Ambulatory Visit
Admission: RE | Admit: 2021-08-08 | Discharge: 2021-08-08 | Disposition: A | Discharge status: Home / Self Care | Payer: Medicare Other | Source: Ambulatory Visit | Attending: Neurology | Admitting: Neurology

## 2021-08-08 DIAGNOSIS — R413 Other amnesia: Secondary | ICD-10-CM

## 2021-08-08 DIAGNOSIS — F439 Reaction to severe stress, unspecified: Secondary | ICD-10-CM

## 2021-08-08 DIAGNOSIS — G20 Parkinson's disease: Secondary | ICD-10-CM | POA: Diagnosis not present

## 2021-08-08 DIAGNOSIS — G4733 Obstructive sleep apnea (adult) (pediatric): Secondary | ICD-10-CM

## 2021-08-12 ENCOUNTER — Other Ambulatory Visit: Payer: Self-pay | Admitting: Student

## 2021-08-13 ENCOUNTER — Telehealth: Payer: Self-pay

## 2021-08-13 NOTE — Telephone Encounter (Signed)
-----   Message from Star Age, MD sent at 08/12/2021  7:56 AM EST ----- Interpreter needed.  Patient's brain MRI showed age-appropriate findings and chronic, stable findings, no acute findings.  She can follow-up as scheduled.

## 2021-08-13 NOTE — Telephone Encounter (Signed)
I called patient using Geophysical data processor. [place], XF#[id number], interpreted for patient. Patient's daughter, Casey Liu, per DPR, answered and declined using the interpreter services. The interpreter left the call.  I advised patient's daughter of MRI results and recommendations. Patient's daughter reports that patient started C/L but became agitated and anxious so they stopped it. Patient's PCP started her on gabapentin 300mg  TID and this has helped with her nerve pain.  Patient's daughter asked that I provide this update to Dr. Rexene Alberts.  I reminded patient's daughter of patient's appointment on 09/26/21.   Patient's daughter verbalized understanding. Patient's daughter had no further questions at this time but was encouraged to call back if questions arise.

## 2021-08-13 NOTE — Telephone Encounter (Signed)
Noted, thank you for the updates

## 2021-09-25 NOTE — Progress Notes (Signed)
° ° °PATIENT: Casey Liu °DOB: 11/23/1934 ° °Primary neurologist: Dr. Athar °REASON FOR VISIT: follow up °HISTORY FROM: patient ° °HISTORY OF PRESENT ILLNESS: ° ° °Update 09/25/2021 JM: 86-year-old female with history of OSA, memory loss complaints and parkinsonism.  She is accompanied by her daughter and [place] interpreter.  At prior visit, she was started on Sinemet 25-100 mg half tab 3 times daily and due to reported difficulty tolerating CPAP, Dr. Athar made pressure changes to help with tolerance.  Completed MRI brain 07/2021 which showed mild generalized cortical atrophy (age-appropriate) and chronic stable findings without any acute or new concerns.  Lab work including CMP, B12 and folate panel, RPR and TSH which showed impaired kidney function (chronic) and mildly elevated blood sugar level but otherwise unremarkable.  ° °Per daughter, reports initial complaints persist and no changes since prior visit but overall, no worsening. She had difficulty tolerating Sinemet as it caused increased agitation and anxiety therefore discontinued after 1 week. Daughter concerned regarding lack of physical activity and exercise and not drinking enough water. Tremors persist but daughter feels like right arm tremor started after injury to right shoulder as tremor present since that time. Does c/o bilateral R>L knee pain and neuropathy (diabetic etiology per daughter).  Was started on gabapentin by ?PCP/podiatrist with some improvement.  Short-term memory loss continues but denies worsening.  She continues to have difficulty using her CPAP as she feels like she is suffocating and has difficulty tolerating her mask. ° ° ° ° ° ° ° ° ° ° °OV note from Dr. Athar 06/27/2021 (for reference purposes only) °[name] is 86-year-old right-handed woman with an underlying medical history of hypertension, pulmonary hypertension, type 2 diabetes, reflux disease, seasonal allergies, history of pneumonia,  arthritis, status post left total knee arthroplasty on 04/23/2016, status post right total knee arthroplasty on 11/12/2015, and OSA, who presents for evaluation of her memory loss.  She is referred by her primary care physician, Dr. Garba.  She is accompanied by her daughter, Keta, today, as well as Spanish interpreter, Claudia.  Her daughter provides most of the history.  I have followed her for her sleep apnea and she was last seen in this office on 05/16/2020, at which time she was unfortunately not compliant with her AutoPap machine.  She had been compliant with her AutoPap in the past.  She stopped using her machine after developing a cough but was motivated to restart it.  She was advised to follow-up in 6 months to see the nurse practitioner. °  °Today, 06/27/21: I reviewed her AutoPap compliance data for the past 30 days, she has not been consistent with her AutoPap usage, percent use days greater than 4 hours at 27% only.  Average usage for days on treatment of 4 hours and 36 minutes, residual AHI 10.6/h, pressure for the 95th percentile at 11.4 cm with a range of 5 to 15 cm.  She reports that the pressure feels too high.  The patient has had forgetfulness, including forgetting names of familiar people such as her daughter and granddaughter.  Her youngest daughter lives with her, unfortunately, the daughter has had some serious medical issues which has caused a lot of stress in the family.  The patient is daughter who is with her today does become tearful during the visit.  Patient's older daughter is currently staying with them as well, she is from [place].   °She does not like to do much, she does not like to   to get out of the house much.  They do not let her cook in the house for fear of finding the food.  She has been sleeping okay, does not drink water enough, they try to encourage better hydration.  She had blood work through Dr. Leontine Locket office on 12/13/2020 and I reviewed the results: Cholesterol  panel showed total cholesterol of 196, HDL 65, triglycerides 124, LDL 108, CMP showed glucose of 107, BUN 44 which is elevated, creatinine 1.25 which is mildly elevated, sodium 138, potassium elevated at 5.6, AST 12, ALT 8, alk phos 68, A1c 6.0, CBC with differential unremarkable with the exception of mildly/borderline low platelets at 139.   She does not smoke or drink alcohol.  She does not drink caffeine.  Of note, she has had a tremor for the past at least 1 year.  Memory loss has also been ongoing for the past at least 1 year.  No family history of memory loss or Alzheimer's.  Her mom is alive at 72 years old and maternal aunt lived to be 65, no one in particular with significant memory loss.  The patient is 1 of 11 children altogether, she is the oldest.  She has 7 children of her own.   The patient's allergies, current medications, family history, past medical history, past social history, past surgical history and problem list were reviewed and updated as appropriate.        REVIEW OF SYSTEMS: Out of a complete 14 system review of symptoms, the patient complains only of the following symptoms as listed in HPI, and all other reviewed systems are negative.  ALLERGIES: Allergies  Allergen Reactions   Celebrex [Celecoxib] Nausea And Vomiting and Palpitations   Latex Rash   Sinemet [Carbidopa-Levodopa] Anxiety    HOME MEDICATIONS: Outpatient Medications Prior to Visit  Medication Sig Dispense Refill   acetaminophen (TYLENOL) 325 MG tablet Take 650 mg by mouth every 6 (six) hours as needed for mild pain or headache.     aspirin EC 81 MG tablet Take 81 mg by mouth daily.     atenolol (TENORMIN) 100 MG tablet Take 1 tablet (100 mg total) by mouth every evening. 90 tablet 1   COMBIGAN 0.2-0.5 % ophthalmic solution Place 1 drop into both eyes daily.     gabapentin (NEURONTIN) 300 MG capsule Take 300 mg by mouth 3 (three) times daily.     hydrochlorothiazide (MICROZIDE) 12.5 MG capsule  Take 1 capsule (12.5 mg total) by mouth daily. 90 capsule 3   LUMIGAN 0.01 % SOLN Place 1 drop into both eyes at bedtime.     metFORMIN (GLUCOPHAGE) 500 MG tablet Take 500 mg by mouth 2 (two) times daily with a meal.      olmesartan (BENICAR) 40 MG tablet TAKE 1 TABLET BY MOUTH EVERY DAY 90 tablet 3   spironolactone (ALDACTONE) 25 MG tablet TAKE 1 TABLET BY MOUTH EVERY DAY 90 tablet 1   prednisoLONE acetate (PRED FORTE) 1 % ophthalmic suspension Place 1 drop into the left eye in the morning and at bedtime.     No facility-administered medications prior to visit.    PAST MEDICAL HISTORY: Past Medical History:  Diagnosis Date   Arthritis    Cataract    right   Diabetes mellitus without complication ([place])    Family history of anesthesia complication    mother had "three heart attacks after anesthesia"   Headache(784.0)    hx of   History of stomach ulcers  Dr. Garba [phone]  ° Pneumonia   ° hx of   ° Seasonal allergies   ° Sleep apnea   ° cpap  ° Stroke ([facility])   ° left side weakness  ° Urinary tract infection   ° hx of  ° ° °PAST SURGICAL HISTORY: °Past Surgical History:  °Procedure Laterality Date  ° ABDOMINAL HYSTERECTOMY    ° APPENDECTOMY    ° BLADDER SURGERY    ° CATARACT EXTRACTION W/PHACO  07/07/2012  ° Procedure: CATARACT EXTRACTION PHACO AND INTRAOCULAR LENS PLACEMENT ([facility]);  Surgeon: Greer Geiger, MD;  Location: [facility] OR;  Service: Ophthalmology;  Laterality: Right;  ° CESAREAN SECTION    ° x 1  ° CHOLECYSTECTOMY    ° EYE SURGERY    ° "seven eye surgery"  ° KNEE SURGERY    ° left knee  ° OVARIAN CYST REMOVAL    ° TOTAL KNEE ARTHROPLASTY Right 11/12/2015  ° Procedure: TOTAL KNEE ARTHROPLASTY;  Surgeon: Frank Rowan, MD;  Location: [facility] OR;  Service: Orthopedics;  Laterality: Right;  ° TOTAL KNEE ARTHROPLASTY Left 04/23/2016  ° Procedure: LEFT TOTAL KNEE ARTHROPLASTY;  Surgeon: Frank Rowan, MD;  Location: [facility] OR;  Service: Orthopedics;  Laterality: Left;  ° TUBAL LIGATION     ° ° °FAMILY HISTORY: °Family History  °Problem Relation Age of Onset  ° Breast cancer Daughter 50  ° Healthy Mother   ° Dementia Mother   ° Healthy Father   ° ° °SOCIAL HISTORY: °Social History  ° °Socioeconomic History  ° Marital status: Single  °  Spouse name: Not on file  ° Number of children: 8  ° Years of education: Not on file  ° Highest education level: Not on file  °Occupational History  ° Not on file  °Tobacco Use  ° Smoking status: Never  ° Smokeless tobacco: Never  °Substance and Sexual Activity  ° Alcohol use: No  °  Comment: social hx   ° Drug use: No  ° Sexual activity: Not on file  °Other Topics Concern  ° Not on file  °Social History Narrative  ° Lives with daughter  ° °Social Determinants of Health  ° °Financial Resource Strain: Not on file  °Food Insecurity: Not on file  °Transportation Needs: Not on file  °Physical Activity: Not on file  °Stress: Not on file  °Social Connections: Not on file  °Intimate Partner Violence: Not on file  ° ° ° ° °PHYSICAL EXAM ° °Vitals:  ° 09/26/21 0812  °BP: (!) 165/78  °Pulse: (!) 54  °Weight: 143 lb (64.9 kg)  °Height: 5' 2" (1.575 m)  ° °Body mass index is 26.16 kg/m². ° °General: Frail very pleasant elderly female, eated, in no evident distress °Head: head normocephalic and atraumatic.   °Neck: supple with no carotid or supraclavicular bruits °Cardiovascular: regular rate and rhythm, no murmurs °Musculoskeletal: bony step in right shoulder with limited ROM (chronic dislocation per daughter), limited ROM R leg due to hip and knee pain °Vascular:  Normal pulses all extremities °  °Neurologic Exam °Mental Status: Awake and fully alert.  Primarily Spanish-speaking.  Disoriented to place and time. Recent memory impaired and remote memory intact. Attention span, concentration and fund of knowledge impaired with daughter providing history. Mood appropriate and affect flat.  °MMSE - Mini Mental State Exam 09/26/2021  °Orientation to time 0  °Orientation to Place 0   °Registration 3  °Attention/ Calculation 1  °Recall 0  °Language- name 2 objects 0  °Language- repeat 1  °  Language- repeat 1  Language- follow 3 step command 2  Language- read & follow direction 1  Write a sentence 0  Copy design 0  Total score 8   Cranial Nerves: Pupils equal, briskly reactive to light. Extraocular movements full without nystagmus. Visual fields full to confrontation. Hearing intact. Facial sensation intact. Face, tongue, palate moves normally and symmetrically.  Motor: limited shoulder shrug of RUE with mild weakness proximal RUE but testing limited due to shoulder pain.  Unable to appreciate LUE weakness. Proximal BLE weakness in hip flexors otherwise full strength noted.  Persistent RUE tremor, occasional mild LUE tremor, no evidence of tremor in head or BLE Sensory.: decreased sensation BLE distally.  Coordination: Rapid alternating movements slowed in all extremities greater right side. Finger-to-nose and heel-to-shin difficulty assessing due to joint pain and difficulty understanding directions.  Gait and Station: Arises from chair with mild difficulty.  Stance is slightly hunched.  Slow cautious steps with slight shuffling intermittently and decreased arm swing bilaterally. Does primarily ambulate with a cane but able to take a few steps without AD Reflexes: 1+ and symmetric. Toes downgoing.      DIAGNOSTIC DATA (LABS, IMAGING, TESTING) - I reviewed patient records, labs, notes, testing and imaging myself where available.   MR BRAIN WO CONTRAST 08/08/2021 IMPRESSION: This MRI of the brain with and without contrast shows the following: 1.   The brain appears normal for age with fairly mild generalized cortical atrophy and normal signal. 2.   The pituitary gland is larger than typical for age but unchanged compared to the 09/18/2019 MRI.  This is likely an incidental finding. 3.   Mild bilateral mastoid effusions.  4.   No acute findings.    Lab Results  Component Value Date   WBC  5.0 02/03/2021   HGB 12.1 02/03/2021   HCT 38.4 02/03/2021   MCV 98.0 02/03/2021   PLT 198 02/03/2021      Component Value Date/Time   NA 142 06/27/2021 0905   K 5.2 06/27/2021 0905   CL 105 06/27/2021 0905   CO2 21 06/27/2021 0905   GLUCOSE 135 (H) 06/27/2021 0905   GLUCOSE 104 (H) 02/03/2021 1831   BUN 38 (H) 06/27/2021 0905   CREATININE 1.36 (H) 06/27/2021 0905   CALCIUM 10.5 (H) 06/27/2021 0905   PROT 7.4 06/27/2021 0905   ALBUMIN 4.6 06/27/2021 0905   AST 16 06/27/2021 0905   ALT 13 06/27/2021 0905   ALKPHOS 100 06/27/2021 0905   BILITOT 0.3 06/27/2021 0905   GFRNONAA 33 (L) 02/03/2021 1831   GFRAA 54 (L) 09/18/2019 0510   No results found for: CHOL, HDL, LDLCALC, LDLDIRECT, TRIG, CHOLHDL Lab Results  Component Value Date   HGBA1C 6.2 (H) 04/15/2016   Lab Results  Component Value Date   [id number] 932 06/27/2021   Lab Results  Component Value Date   TSH 2.090 06/27/2021      ASSESSMENT AND PLAN 87 y.o. year old female with:   1.  Parkinsonism 2.  Memory loss 3.  OSA on CPAP     MR brain unremarkable for new or acute findings, age-appropriate generalized cortical atrophy. Lab work largely unremarkable. Intolerant to Sinemet, will place referral for [facility] PT to hopefully further help with gait, she also has chronic pain and neuropathy possibly contributing Discussed importance of increasing daytime activity and exercise as well as safety permits and importance of memory exercises.  MMSE today 8/30. Can be repeated at f/u visit. Discussed use of medications such  as Aricept or Namenda but as memory stable since prior visit, will hold off Discussed importance of nightly CPAP use which can help slow progression of memory loss and increase energy levels during the day. Discussed ways to help with tolerance. Noted improvement on recent download compared to prior download.    Follow up in 6 months with Dr. Rexene Alberts to further review parkinsonism and any further  treatment options     I spent a prolonged 74 minutes of face-to-face and non-face-to-face time with patient and daughter assisted by interpreter.  This included previsit chart review, lab review, study review, order entry, electronic health record documentation, patient and daughter education and discussion regarding above concerns with multiple questions and concerns addressed, review and discussion of MMSE and further information as noted above and answered all other questions to patient and daughter satisfaction  Frann Rider, AGNP-BC  [facility] [address] [place], [place] [address]  Phone [phone] Fax [phone] Note: This document was prepared with digital dictation and possible smart phrase technology. Any transcriptional errors that result from this process are unintentional.

## 2021-09-26 ENCOUNTER — Telehealth: Payer: Self-pay | Admitting: Adult Health

## 2021-09-26 ENCOUNTER — Encounter: Payer: Self-pay | Admitting: Adult Health

## 2021-09-26 ENCOUNTER — Ambulatory Visit (INDEPENDENT_AMBULATORY_CARE_PROVIDER_SITE_OTHER): Payer: Medicare Other | Admitting: Adult Health

## 2021-09-26 ENCOUNTER — Other Ambulatory Visit: Payer: Self-pay

## 2021-09-26 VITALS — BP 165/78 | HR 54 | Ht 62.0 in | Wt 143.0 lb

## 2021-09-26 DIAGNOSIS — R413 Other amnesia: Secondary | ICD-10-CM

## 2021-09-26 DIAGNOSIS — Z9989 Dependence on other enabling machines and devices: Secondary | ICD-10-CM | POA: Diagnosis not present

## 2021-09-26 DIAGNOSIS — G4733 Obstructive sleep apnea (adult) (pediatric): Secondary | ICD-10-CM

## 2021-09-26 DIAGNOSIS — G20 Parkinson's disease: Secondary | ICD-10-CM

## 2021-09-26 NOTE — Telephone Encounter (Signed)
Sent message to [place] with [facility] to see if she would be able to take the patient.  ?

## 2021-09-26 NOTE — Patient Instructions (Addendum)
Your Plan: ? ? ?It will be very important for your to move and exercise as much as you can as your ambulation difficulties and memory can continue to decline if you are not active  ? ?It will be very important for you to use your CPAP machine every night for at least 4 hours nightly. Without adequate treatment of your sleep apnea, your memory can continue to decline and increases your risk of stroke and cardiovascular disease  ? ? ? ? ? ? ? ? ? ? ? ?Thank you for coming to see [place] at [facility]. I hope we have been able to provide you high quality care today. ? ?You may receive a patient satisfaction survey over the next few weeks. We would appreciate your feedback and comments so that we may continue to improve ourselves and the health of our patients. ? ? ?

## 2021-09-30 NOTE — Telephone Encounter (Signed)
[place] with [facility] is able to take this patient.  ?

## 2021-10-01 NOTE — Telephone Encounter (Signed)
Called daughter on DPR, LVM requesting call back.  ?

## 2021-10-01 NOTE — Telephone Encounter (Signed)
Discussed home health therapy multiple times during visit as daughter was concerned patient did not get enough activity or any type of routine exercise. Patient and daughter did both agree and very interested in pursuing home health therapies. I am not sure what surgery or old injury daughter is referring to but no reason not to pursue therapies from our standpoint. ?

## 2021-10-01 NOTE — Telephone Encounter (Signed)
[place] with [facility] messaged me stating: ? ? ? ?Can you please have MD or assistant give daughter a call? She will not allow [place] to see her until she speaks with MA or Dr. ? ? ? ? ? ?We tried to schedule this pt for the weekend and when the PT contacted, they would not allow her to come on the weekend and requested Tues for [facility]. We again scheduled for today and now the pt dtr is saying the below.. Please let us know how you want to proceed. ? ?VISIT NARRATIVE: CALLED PATIENT'S CAREGIVER TO SCHEDULE PATIENT FOR SOC VISIT FOR TODAY. CAREGIVER REFUSED VISIT AND STATED SHE WANTED TO GET IN CONTACT WITH THE PATIENT'S MD/SURGEON BEFORE PATIENT STARTS HH SERVICES, BECAUSE THE PATIENT IS COMPLAINING OF PAIN FROM A 87 YEAR OLD SURGERY. ? ?

## 2021-10-02 NOTE — Telephone Encounter (Signed)
Called daughter, LVM #2 requesting call back.  ?

## 2022-04-01 ENCOUNTER — Ambulatory Visit (INDEPENDENT_AMBULATORY_CARE_PROVIDER_SITE_OTHER): Payer: Medicare Other | Admitting: Neurology

## 2022-04-01 ENCOUNTER — Encounter: Payer: Self-pay | Admitting: Neurology

## 2022-04-01 VITALS — BP 134/72 | HR 57 | Ht 62.0 in | Wt 149.0 lb

## 2022-04-01 DIAGNOSIS — G4733 Obstructive sleep apnea (adult) (pediatric): Secondary | ICD-10-CM

## 2022-04-01 DIAGNOSIS — G20 Parkinson's disease: Secondary | ICD-10-CM | POA: Diagnosis not present

## 2022-04-01 DIAGNOSIS — G4739 Other sleep apnea: Secondary | ICD-10-CM

## 2022-04-01 DIAGNOSIS — R413 Other amnesia: Secondary | ICD-10-CM

## 2022-04-01 DIAGNOSIS — R443 Hallucinations, unspecified: Secondary | ICD-10-CM | POA: Diagnosis not present

## 2022-04-01 DIAGNOSIS — Z9989 Dependence on other enabling machines and devices: Secondary | ICD-10-CM

## 2022-04-01 MED ORDER — CARBIDOPA-LEVODOPA 25-100 MG PO TABS: 1.0000 | ORAL_TABLET | Freq: Three times a day (TID) | ORAL | 3 refills | Status: DC

## 2022-04-01 NOTE — Progress Notes (Signed)
Subjective:    Patient ID: Casey Liu is a 87 y.o. female.  HPI    Interim history:   Casey Liu is 87 year old right-handed woman with an underlying medical history of hypertension, pulmonary hypertension, type 2 diabetes, reflux disease, seasonal allergies, history of pneumonia, arthritis, status post left total knee arthroplasty on 04/23/2016, status post right total knee arthroplasty on 11/12/2015, and OSA, who presents for Follow-up consultation of her sleep apnea and parkinsonism associated with memory loss.  The patient is accompanied by her daughter and Nicole Kindred, [place] interpreter, today.  I last saw her on 06/27/2021, at which time she was referred by her primary care provider for evaluation of memory loss.  Examination was consistent with parkinsonism.  She had right-sided lateralization.  She was advised to start Sinemet.  She was advised to be consistent with her AutoPap machine.  She was advised to proceed with a brain MRI.  She had a brain MRI without contrast on 08/08/2021 and I reviewed the results:   IMPRESSION: This MRI of the brain with and without contrast shows the following: 1.   The brain appears normal for age with fairly mild generalized cortical atrophy and normal signal. 2.   The pituitary gland is larger than typical for age but unchanged compared to the 09/18/2019 MRI.  This is likely an incidental finding. 3.   Mild bilateral mastoid effusions.  4.   No acute findings.    The patient's daughter called in the interim in January 2023 reporting that the patient had more anxiety on the medication and they stopped it.  She saw Frann Rider, NP, in the interim on 09/26/2021, at which time she was suboptimal with her AutoPap compliance, residual AHI was elevated at 19 with primarily central events.  Her MMSE was 8.  The potential use of memory medication was discussed at the time.  She was encouraged to be compliant with her AutoPap  machine.  Today, 04/01/2022: She reports tingling and burning sensation in her right pinky finger.  Of note, she is on gabapentin 300 mg 3 times daily.  She is no longer on levodopa therapy, still has issues with anxiety and agitation, has had visual hallucinations and memory loss.  She has not fallen recently, she uses a walker at the house.  She uses her AutoPap, I was able to review her compliance data from 03/01/2022 through 03/30/2022, during which time she used her machine 29 days with percent use days greater than 4 hours at 67%, indicating slightly suboptimal compliance, average usage of 6 hours and 10 minutes, residual AHI elevated at 21.2/h with primarily central events with a central respiratory index of 6.6/h, average pressure for the 95th percentile at 9.6 cm with a range of 4 to 10 cm with EPR.  She has ongoing issues with forgetfulness, she has ongoing issues with tremors.  Previously:   I saw her on 05/16/2020, at which time she was unfortunately not compliant with her AutoPap machine.  She had been compliant with her AutoPap in the past.  She stopped using her machine after developing a cough but was motivated to restart it.  She was advised to follow-up in 6 months to see the nurse practitioner.   I reviewed her AutoPap compliance data for the past 30 days, she has not been consistent with her AutoPap usage, percent use days greater than 4 hours at 27% only.  Average usage for days on treatment of 4 hours and 36 minutes, residual AHI 10.6/h, pressure  for the 95th percentile at 11.4 cm with a range of 5 to 15 cm.       She saw Ward Givens, NP, in the interim on 06/16/2017, at which time she was doing well, tolerating therapy and was compliant with CPAP.   She saw Ward Givens on 12/16/2017, at which time her residual AHI was 10.4/h on a pressure of 9 cm.  She was compliant with treatment.  She was changed to AutoPap therapy on a pressure range of 5 to 15 cm.   She saw Ward Givens,  NP, on 06/30/2018 at which time her compliance has declined a little bit.  She was reporting allergy symptoms and not using her CPAP all the time.  She had needed a piece of supply from her DME company which eventually was replaced.  She was advised to continue with treatment.     She was seen by Ward Givens on 02/08/2019, at which time she was suboptimal with her compliance for more than 4 hours.  Residual AHI was at goal on AutoPap.  She did have a higher leak.   I reviewed her AutoPap compliance data for the past 90 days from 02/14/2020 through 05/13/2020, during which time the patient used her machine only 7 days with percent use days greater than 4 hours at 3% only, indicating very low compliance, essentially no usage after 03/12/2020.     I first met her on 07/09/2016 at the request of her cardiologist, at which time she reported a prior diagnosis of obstructive sleep apnea. She had an older CPAP machine. She had some morning headaches. I suggested we proceed with sleep study testing. She had a baseline sleep study, followed by a CPAP titration study. I went over her test results with her in detail today. Her baseline sleep study from 07/24/2016 showed a sleep efficiency of 78.1%, sleep latency 23 minutes, REM latency markedly delayed at 261.5 minutes. She had a decreased percentage of REM sleep, slow-wave sleep was absent, marked increase in stage II sleep. Total AHI was 28.2 per hour, REM AHI 67.8 per hour, supine AHI 30.9 per hour. Average oxygen saturation was 96%, nadir was 73%. She had no significant PLMS. Based on her medical history, her sleep-related complaints and her sleep study results I invited her for a full night CPAP titration study. She had this on 08/14/2016. Sleep efficiency was 59%, sleep latency 9.5 minutes, REM latency markedly prolonged at 319.5 minutes. She had a normal amount of slow-wave sleep, REM sleep was 12.5%. CPAP was titrated from 5 cm to 7 cm. Since she did not have  complete elimination of her sleep disordered breathing I suggested a CPAP pressure of 8 cm at home. She did not have any significant PLMS, baseline oxygen saturation was 90%, nadir was 85%.   I reviewed her CPAP compliance data from 11/10/2016 through 12/09/2016, which is a total of 30 days, during which time she used her machine 29 days with percent used days greater than 4 hours at 90%, indicating excellent compliance with an average usage of 6 hours and 35 minutes, residual AHI is slightly suboptimal at 7.2 per hour, leak on the high side with the 95th percentile at 32.6 L/m on a pressure of 8 cm with EPR of 3.      07/09/2016: She was previously diagnosed with obstructive sleep apnea and placed on CPAP therapy. Sleep study testing was over 5 years ago, prior sleep study results are not available for my review. I reviewed your  office note from 06/23/2016, which you kindly included.  The CPAP download is not available for my review today. Her machine is about 85 years old she estimates. She has been using a nasal mask which breaks easily. Prior to that she was prescribed a full face mask which she preferred. With CPAP she has been sleeping better, without CPAP she has a history of loud snoring. She has been waking up with headaches frequently. These are not severe or debilitating, dull and achy typically. She denies any restless leg symptoms but has residual knee pain bilaterally, left more than right. She has a cane available and also a walker but typically uses her cane only. She reports a bedtime of around 9 PM and while she falls asleep fairly quickly, she is often awake right after midnight and has trouble going back to sleep. Sleep was severely disrupted by multiple bathroom breaks, she says that she woke up to the bathroom up to 10 times per night. She has a bedside commode. She is typically out of bed by 7:15 AM. She occasionally naps. Epworth sleepiness score is 9 out of 24, fatigue score is 57 out  of 63. She lives with one of her daughters. She has a total of 7 children. She does not smoke or drink alcohol or use caffeine on a daily basis. She has not been able to use her CPAP for at least 2 weeks. She complains of discomfort with the mask and machine is not working properly.    Her Past Medical History Is Significant For: Past Medical History:  Diagnosis Date   Arthritis    Cataract    right   Diabetes mellitus without complication ([place])    Family history of anesthesia complication    mother had "three heart attacks after anesthesia"   Headache(784.0)    hx of   History of stomach ulcers    Hypertension    Dr. Jonelle Sidle [phone]   Pneumonia    hx of    Seasonal allergies    Sleep apnea    cpap   Stroke ([place])    left side weakness   Urinary tract infection    hx of    Her Past Surgical History Is Significant For: Past Surgical History:  Procedure Laterality Date   ABDOMINAL HYSTERECTOMY     APPENDECTOMY     BLADDER SURGERY     CATARACT EXTRACTION W/PHACO  07/07/2012   Procedure: CATARACT EXTRACTION PHACO AND INTRAOCULAR LENS PLACEMENT ([place]);  Surgeon: Adonis Brook, MD;  Location: [place];  Service: Ophthalmology;  Laterality: Right;   CESAREAN SECTION     x 1   CHOLECYSTECTOMY     EYE SURGERY     "seven eye surgery"   KNEE SURGERY     left knee   OVARIAN CYST REMOVAL     TOTAL KNEE ARTHROPLASTY Right 11/12/2015   Procedure: TOTAL KNEE ARTHROPLASTY;  Surgeon: Frederik Pear, MD;  Location: [facility];  Service: Orthopedics;  Laterality: Right;   TOTAL KNEE ARTHROPLASTY Left 04/23/2016   Procedure: LEFT TOTAL KNEE ARTHROPLASTY;  Surgeon: Frederik Pear, MD;  Location: [place];  Service: Orthopedics;  Laterality: Left;   TUBAL LIGATION      Her Family History Is Significant For: Family History  Problem Relation Age of Onset   Breast cancer Daughter 64   Healthy Mother    Dementia Mother    Healthy Father     Her Social History Is Significant For: Social History    Socioeconomic  History   Marital status: Single    Spouse name: Not on file   Number of children: 8   Years of education: Not on file   Highest education level: Not on file  Occupational History   Not on file  Tobacco Use   Smoking status: Never   Smokeless tobacco: Never  Substance and Sexual Activity   Alcohol use: No    Comment: social hx    Drug use: No   Sexual activity: Not on file  Other Topics Concern   Not on file  Social History Narrative   Lives with daughter   Social Determinants of Health   Financial Resource Strain: Not on file  Food Insecurity: Not on file  Transportation Needs: Not on file  Physical Activity: Not on file  Stress: Not on file  Social Connections: Not on file    Her Allergies Are:  Allergies  Allergen Reactions   Celebrex [Celecoxib] Nausea And Vomiting and Palpitations   Latex Rash   Sinemet [Carbidopa-Levodopa] Anxiety  :   Her Current Medications Are:  Outpatient Encounter Medications as of 04/01/2022  Medication Sig   acetaminophen (TYLENOL) 325 MG tablet Take 650 mg by mouth every 6 (six) hours as needed for mild pain or headache.   aspirin EC 81 MG tablet Take 81 mg by mouth daily.   atenolol (TENORMIN) 100 MG tablet Take 1 tablet (100 mg total) by mouth every evening.   COMBIGAN 0.2-0.5 % ophthalmic solution Place 1 drop into both eyes daily.   gabapentin (NEURONTIN) 300 MG capsule Take 300 mg by mouth 3 (three) times daily.   LUMIGAN 0.01 % SOLN Place 1 drop into both eyes at bedtime.   metFORMIN (GLUCOPHAGE) 500 MG tablet Take 500 mg by mouth 2 (two) times daily with a meal.    olmesartan (BENICAR) 40 MG tablet TAKE 1 TABLET BY MOUTH EVERY DAY   spironolactone (ALDACTONE) 25 MG tablet TAKE 1 TABLET BY MOUTH EVERY DAY   hydrochlorothiazide (MICROZIDE) 12.5 MG capsule Take 1 capsule (12.5 mg total) by mouth daily. (Patient not taking: Reported on 04/01/2022)   No facility-administered encounter medications on file as of  04/01/2022.  :  Review of Systems:  Out of a complete 14 point review of systems, all are reviewed and negative with the exception of these symptoms as listed below:  Review of Systems  Neurological:        Patient is here with her daughter and Spanish interpreter for follow-up. Her daughter states the patient is worse. She states her mind is worse. Sometimes she gets aggressive and doesn't recognize inner circle people. The tremor is a little worse. Daughter states she does appear to have visual and audible hallucinations. She calls for her father who has passed.     Objective:  Neurological Exam  Physical Exam Physical Examination:   Vitals:   04/01/22 1413  BP: 134/72  Pulse: (!) 57    General Examination: The patient is a very pleasant 87 y.o. female in no acute distress. She appears frail, well-groomed.  Minimally verbal.    HEENT: Normocephalic, atraumatic, pupils are equal, round and reactive to light.  Extraocular tracking is mildly impaired, mild facial masking noted, mild to moderate to severe nuchal rigidity, mildly decreased eye blink rate.  She is status post cataract repairs bilaterally. There are no carotid bruits on auscultation. Oropharynx exam reveals: mild to moderate mouth dryness. Tongue protrudes centrally and palate elevates symmetrically.    Chest: Clear to auscultation  without wheezing, rhonchi or crackles noted.   Heart: S1+S2+0, regular and normal without murmurs, rubs or gallops noted.    Abdomen: Soft, non-tender and non-distended.   Extremities: There is mild to moderate puffiness around the ankles.   Skin: Warm and dry without trophic changes noted. There are no varicose veins.   Musculoskeletal: exam reveals bony step in the right shoulder, limited range of motion of the right shoulder joint, could be chronic dislocation, reports some discomfort in her right shoulder.  Right shoulder higher than left.   Neurologically:  Mental status: The patient  is awake, alert and pays attention but is unable to provide her history in any detail, is able to answer simple questions. History is primarily obtained from her daughter.  Mood is mildly depressed, affect mildly flat.    Cranial nerves II - XII are as described above under HEENT exam. Right shoulder height is decreased compared to left.  Motor exam: Normal bulk, global strength of 4/5. She has a fairly consistent mild to moderate resting tremor in the right upper extremity, milder in the left upper extremity and more intermittent on the left, no lower extremity tremor.   On fine motor testing, she has moderate difficulty on the right, better on the left.   Cerebellar testing: No dysmetria or intention tremor.  Sensory exam: intact to light touch in the upper and lower extremities with mild decrease to sensation noted in the distal lower extremities.  Gait, station and balance: She stands with moderate difficulty, posture is moderately stooped, she has increase in upper back curvature.  She walks with stutter steps and mild shuffling, difficulty with turns, she has a four-point cane.     Assessment and Plan:  In summary, Casey Liu is a very pleasant 87 year old female with an underlying medical history of hypertension, pulmonary hypertension, type 2 diabetes, reflux disease, seasonal allergies, history of pneumonia, arthritis, status post left total knee arthroplasty on 04/23/2016, status post right total knee arthroplasty on 11/12/2015, and OSA, who presents for follow-up consultation of her parkinsonism.  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  She had stopped taking low-dose Sinemet last year or in early January of this year.  She is advised to restart it at 25-100 mg strength half a pill 3 times daily for 2 weeks and then increase it to 1 pill 3 times daily thereafter if tolerated.  We talked about potential side  effects and the possibility of keeping her on a lower dose such as half a pill 3 times daily if need be.  She had reportedly developed some anxiety when she was first put on the medication but she still has issues with agitation and anxiety which may be unrelated to the levodopa.  Her daughter is willing to have her retry it.  We will consider medication for memory loss at the next visit as I do not recommend starting 2 medications at the same time.  She may be a good candidate for low-dose memantine, I would probably favor this over a trial of Aricept, given the bradycardia and hallucinations that she has already.  She is advised to consistently use her CPAP machine, I would like to change her settings to a pressure of 9 cm from AutoPap.  She does have evidence of central apneas at this time, she may do a little bit better with a CPAP rather than AutoPap.  She is advised to follow-up to see the nurse practitioner in  about 3 months and we will consider adding Namenda at the time.  Of note, her brain MRI in January 2023 showed mild generalized atrophy, no telltale findings that can be seen with Alzheimer's type dementia.  I answered all the questions today and the patient and her daughter were in agreement. I spent 40 minutes in total face-to-face time and in reviewing records during pre-charting, more than 50% of which was spent in counseling and coordination of care, reviewing test results, reviewing medications and treatment regimen and/or in discussing or reviewing the diagnosis of primary parkinsonism, sleep apnea,, the prognosis and treatment options. Pertinent laboratory and imaging test results that were available during this visit with the patient were reviewed by me and considered in my medical decision making (see chart for details).

## 2022-04-01 NOTE — Patient Instructions (Addendum)
It was nice to see you again today.  As discussed, we will restart you on a medication called Sinemet (generic name: carbidopa-levodopa) 25/100 mg: Take half a pill 3 times a day (8 AM, noon, and 4 PM) for 2 weeks, then increase to 1 pill 3 times daily thereafter.  You previously reported having anxiety on the medication but you anxiety after stopping it.  I think it is worth retrying the medication.   Please try to take the medication away from you mealtimes, that is, ideally either one hour before or 2 hours after your meal to ensure optimal absorption. The medication can interfere with the protein content of your meal and trying to the protein in your food and therefore not get fully absorbed.  Common side effects reported are: Nausea, vomiting, sedation, confusion, lightheadedness. Rare side effects include hallucinations, severe nausea or vomiting, diarrhea and significant drop in blood pressure especially when going from lying to standing or from sitting to standing.  Follow-up to see the nurse practitioner in 3 months, we will consider adding a medication for your memory loss at the time.  As discussed, I will also change your treatment on your CPAP machine to a pressure of 9 cm.

## 2022-04-02 ENCOUNTER — Telehealth: Payer: Self-pay | Admitting: Neurology

## 2022-04-02 NOTE — Telephone Encounter (Signed)
I called daughter of pt who takes care of pt and her medications.  I relayed that it was noted that anxiety and agitation was noted when started the sinemet previously.  I relayed could have been coincidental but will relay her concerns about restarting the low dose sinemet and see if there is another option.  She appreciated call back. May be tomorrow that we call her back.

## 2022-04-02 NOTE — Telephone Encounter (Signed)
Pt's daughter, Johnella Crumm (on [place]) said mother was taken off of carbidopa-levodopa (SINEMET IR) 25-100 MG tablet sometime last year. She had reaction to it and was taken off of it because she was acting crazy.  My sister took mother to her office visit yesterday and carbidopa-levodopa (SINEMET IR) 25-100 MG tablet was prescribed again. Would like a call from the nurse to discuss.

## 2022-04-02 NOTE — Telephone Encounter (Signed)
I called her back.. I relayed that per Dr. Rexene Alberts that she can cancel the sinemet but she has no other options to try as the other medications for PD would higher likelihood of SE.   She will try the sinemet / carbidopa/levodopa as titration was explained previously.  Will let us know if any issues.  Appreciated call back

## 2022-04-02 NOTE — Telephone Encounter (Signed)
If patient's daughter does not wish to have her restart the Sinemet, the medication she had tried before, then we can cancel the prescription. I would not recommend an alternative as other medications for Parkinson's disease will have a higher likelihood to cause side effects.

## 2022-04-03 NOTE — Progress Notes (Signed)
New, Willodean Rosenthal, RN; Redmond Pulling Eleonore Chiquito, Pasty Arch Received on 04-02-22 and in process. Thank you!      Previous Messages    ----- Message -----  From: Brandon Melnick, RN  Sent: 04/03/2022  10:56 AM EDT  To: Darlina Guys; Miquel Dunn; Stephannie Peters; *  Subject: change from autopap to a set pressure of 9cmRaquel James D. Liu  Female, 87 y.o., 03/31/1935  MRN:  [id number]  Needs Interpreter: Spanish   I may have sent this to you, so if I did disregard.  (New order in epic)   Thank you   Newman Pies

## 2022-04-09 NOTE — Progress Notes (Signed)
New, Willodean Rosenthal, RN; Redmond Pulling, Clovis Riley Received, Thank you!      Previous Messages    ----- Message -----  From: Brandon Melnick, RN  Sent: 04/08/2022  10:47 AM EDT  To: Darlina Guys; Miquel Dunn; Stephannie Peters; *  Subject: change from auto to cpap                       Good morning,   New order in Epic for pt   Raeven D. Benitez-Bonilla  Female, 87 y.o., May 14, 1935  MRN:  [id number]  Needs Interpreter: Spanish    Thank you   Lovey Newcomer RN

## 2022-07-12 ENCOUNTER — Encounter (HOSPITAL_COMMUNITY): Payer: Self-pay | Admitting: Emergency Medicine

## 2022-07-12 ENCOUNTER — Other Ambulatory Visit: Payer: Self-pay

## 2022-07-12 ENCOUNTER — Emergency Department (HOSPITAL_COMMUNITY)
Admission: EM | Admit: 2022-07-12 | Discharge: 2022-07-13 | Disposition: A | Discharge status: Home / Self Care | Payer: Medicare Other | Attending: Emergency Medicine | Admitting: Emergency Medicine

## 2022-07-12 DIAGNOSIS — E875 Hyperkalemia: Secondary | ICD-10-CM

## 2022-07-12 DIAGNOSIS — R799 Abnormal finding of blood chemistry, unspecified: Secondary | ICD-10-CM | POA: Diagnosis present

## 2022-07-12 DIAGNOSIS — Z7982 Long term (current) use of aspirin: Secondary | ICD-10-CM | POA: Insufficient documentation

## 2022-07-12 LAB — CBC WITH DIFFERENTIAL/PLATELET
Abs Immature Granulocytes: 0.02 10*3/uL (ref 0.00–0.07)
Basophils Absolute: 0 10*3/uL (ref 0.0–0.1)
Basophils Relative: 1 %
Eosinophils Absolute: 0.1 10*3/uL (ref 0.0–0.5)
Eosinophils Relative: 2 %
HCT: 41.2 % (ref 36.0–46.0)
Hemoglobin: 13.1 g/dL (ref 12.0–15.0)
Immature Granulocytes: 0 %
Lymphocytes Relative: 32 %
Lymphs Abs: 1.9 10*3/uL (ref 0.7–4.0)
MCH: 31.1 pg (ref 26.0–34.0)
MCHC: 31.8 g/dL (ref 30.0–36.0)
MCV: 97.9 fL (ref 80.0–100.0)
Monocytes Absolute: 0.7 10*3/uL (ref 0.1–1.0)
Monocytes Relative: 12 %
Neutro Abs: 3.2 10*3/uL (ref 1.7–7.7)
Neutrophils Relative %: 53 %
Platelets: 183 10*3/uL (ref 150–400)
RBC: 4.21 MIL/uL (ref 3.87–5.11)
RDW: 14.1 % (ref 11.5–15.5)
WBC: 5.9 10*3/uL (ref 4.0–10.5)
nRBC: 0 % (ref 0.0–0.2)

## 2022-07-12 LAB — COMPREHENSIVE METABOLIC PANEL
ALT: 6 U/L (ref 0–44)
AST: 19 U/L (ref 15–41)
Albumin: 3.4 g/dL — ABNORMAL LOW (ref 3.5–5.0)
Alkaline Phosphatase: 81 U/L (ref 38–126)
Anion gap: 4 — ABNORMAL LOW (ref 5–15)
BUN: 32 mg/dL — ABNORMAL HIGH (ref 8–23)
CO2: 26 mmol/L (ref 22–32)
Calcium: 9.6 mg/dL (ref 8.9–10.3)
Chloride: 111 mmol/L (ref 98–111)
Creatinine, Ser: 1.49 mg/dL — ABNORMAL HIGH (ref 0.44–1.00)
GFR, Estimated: 34 mL/min — ABNORMAL LOW (ref >60)
Glucose, Bld: 117 mg/dL — ABNORMAL HIGH (ref 70–99)
Potassium: 5.5 mmol/L — ABNORMAL HIGH (ref 3.5–5.1)
Sodium: 141 mmol/L (ref 135–145)
Total Bilirubin: 0.3 mg/dL (ref 0.3–1.2)
Total Protein: 6.4 g/dL — ABNORMAL LOW (ref 6.5–8.1)

## 2022-07-12 MED ORDER — SODIUM CHLORIDE 0.9 % IV BOLUS: 500.0000 mL | Freq: Once | INTRAVENOUS | Status: DC

## 2022-07-12 MED ORDER — SODIUM ZIRCONIUM CYCLOSILICATE 10 G PO PACK
10.0000 g | PACK | ORAL | Status: AC
  Administered 2022-07-13: 10 g via ORAL
  Filled 2022-07-12: qty 1

## 2022-07-12 NOTE — ED Triage Notes (Addendum)
Patient here accompanied by daughter. Patient had lab work done yesterday and patients potassium was 6.2  Patient was having blood work done by eye doctor as she has an eye infection and needed to be started on antibiotics.

## 2022-07-12 NOTE — ED Provider Triage Note (Signed)
Emergency Medicine Provider Triage Evaluation Note  Casey Liu , a 87 y.o. female  was evaluated in triage.  Pt complains of high potassium.  Patient had outpatient labs drawn of which potassium was 6.2.  Was told to immediately come to the emergency department for evaluation.  Upon further review, daughter states that patient has been eating many more plantains than usual past several days..  Denies chest pain, shortness of breath, dizziness, lightheadedness.  Review of Systems  Positive: See above Negative:   Physical Exam  BP 118/61 (BP Location: Left Arm)   Pulse (!) 51   Temp (!) 97.4 F (36.3 C) (Oral)   Resp 19   SpO2 99%  Gen:   Awake, no distress   Resp:  Normal effort  MSK:   Moves extremities without difficulty  Other:    Medical Decision Making  Medically screening exam initiated at 1:59 PM.  Appropriate orders placed.  Casey Liu was informed that the remainder of the evaluation will be completed by another provider, this initial triage assessment does not replace that evaluation, and the importance of remaining in the ED until their evaluation is complete.     Wilnette Kales, [place] 07/12/22 1401

## 2022-07-12 NOTE — ED Provider Notes (Signed)
[facility] Provider Note   CSN: [id number] Arrival date & time: 07/12/22  1238     History {Add pertinent medical, surgical, social history, OB history to HPI:1} Chief Complaint  Patient presents with   abnormal labs    Casey Liu is a 87 y.o. female.  Patient was referred to the emergency department for evaluation of elevated potassium.  Patient is under the care of an eye doctor and had outpatient labs performed, was called and told that her potassium was 6.2.  She was told to come to the ER immediately.  She is not experiencing any symptoms.       Home Medications Prior to Admission medications   Medication Sig Start Date End Date Taking? Authorizing Provider  acetaminophen (TYLENOL) 325 MG tablet Take 650 mg by mouth every 6 (six) hours as needed for mild pain or headache.    [provider]  aspirin EC 81 MG tablet Take 81 mg by mouth daily.    [provider]  atenolol (TENORMIN) 100 MG tablet Take 1 tablet (100 mg total) by mouth every evening. 10/03/20   Cantwell, Celeste C, PA-C  carbidopa-levodopa (SINEMET IR) 25-100 MG tablet Take 1 tablet by mouth 3 (three) times daily. 04/01/22   Star Age, MD  COMBIGAN 0.2-0.5 % ophthalmic solution Place 1 drop into both eyes daily. 06/14/21   [provider]  gabapentin (NEURONTIN) 300 MG capsule Take 300 mg by mouth 3 (three) times daily. 08/10/21   [provider]  hydrochlorothiazide (MICROZIDE) 12.5 MG capsule Take 1 capsule (12.5 mg total) by mouth daily. Patient not taking: Reported on 04/01/2022 10/03/20 09/28/21  Cantwell, Celeste C, PA-C  LUMIGAN 0.01 % SOLN Place 1 drop into both eyes at bedtime. 06/04/21   [provider]  metFORMIN (GLUCOPHAGE) 500 MG tablet Take 500 mg by mouth 2 (two) times daily with a meal.     [provider]  olmesartan (BENICAR) 40 MG tablet TAKE 1 TABLET BY MOUTH EVERY DAY 08/13/21    Cantwell, Celeste C, PA-C      Allergies    Celebrex [celecoxib], Latex, and Sinemet [carbidopa-levodopa]    Review of Systems   Review of Systems  Physical Exam Updated Vital Signs BP 137/69 (BP Location: Left Arm)   Pulse (!) 59   Temp (!) 97.4 F (36.3 C) (Oral)   Resp 20   SpO2 98%  Physical Exam Vitals and nursing note reviewed.  Constitutional:      General: She is not in acute distress.    Appearance: She is well-developed.  HENT:     Head: Normocephalic and atraumatic.     Mouth/Throat:     Mouth: Mucous membranes are moist.  Eyes:     General: Vision grossly intact. Gaze aligned appropriately.     Extraocular Movements: Extraocular movements intact.     Conjunctiva/sclera: Conjunctivae normal.  Cardiovascular:     Rate and Rhythm: Normal rate and regular rhythm.     Pulses: Normal pulses.     Heart sounds: Normal heart sounds, S1 normal and S2 normal. No murmur heard.    No friction rub. No gallop.  Pulmonary:     Effort: Pulmonary effort is normal. No respiratory distress.     Breath sounds: Normal breath sounds.  Abdominal:     General: Bowel sounds are normal.     Palpations: Abdomen is soft.     Tenderness: There is no abdominal tenderness. There is no guarding  or rebound.     Hernia: No hernia is present.  Musculoskeletal:        General: No swelling.     Cervical back: Full passive range of motion without pain, normal range of motion and neck supple. No spinous process tenderness or muscular tenderness. Normal range of motion.     Right lower leg: No edema.     Left lower leg: No edema.  Skin:    General: Skin is warm and dry.     Capillary Refill: Capillary refill takes less than 2 seconds.     Findings: No ecchymosis, erythema, rash or wound.  Neurological:     General: No focal deficit present.     Mental Status: She is alert and oriented to person, place, and time.     GCS: GCS eye subscore is 4. GCS verbal subscore is 5. GCS motor subscore is  6.     Cranial Nerves: Cranial nerves 2-12 are intact.     Sensory: Sensation is intact.     Motor: Motor function is intact.     Coordination: Coordination is intact.  Psychiatric:        Attention and Perception: Attention normal.        Mood and Affect: Mood normal.        Speech: Speech normal.        Behavior: Behavior normal.     ED Results / Procedures / Treatments   Labs (all labs ordered are listed, but only abnormal results are displayed) Labs Reviewed  COMPREHENSIVE METABOLIC PANEL - Abnormal; Notable for the following components:      Result Value   Potassium 5.5 (*)    Glucose, Bld 117 (*)    BUN 32 (*)    Creatinine, Ser 1.49 (*)    Total Protein 6.4 (*)    Albumin 3.4 (*)    GFR, Estimated 34 (*)    Anion gap 4 (*)    All other components within normal limits  CBC WITH DIFFERENTIAL/PLATELET    EKG EKG Interpretation  Date/Time:  Saturday July 12 2022 13:33:11 EST Ventricular Rate:  50 PR Interval:  216 QRS Duration: 88 QT Interval:  490 QTC Calculation: 446 R Axis:   -14 Text Interpretation: Sinus bradycardia with 1st degree A-V block Left ventricular hypertrophy with repolarization abnormality ( R in aVL ) Abnormal ECG When compared with ECG of 18-Sep-2019 05:35, No significant change was found Confirmed by Orpah Greek [phone]) on 07/12/2022 11:11:05 PM  Radiology No results found.  Procedures Procedures  {Document cardiac monitor, telemetry assessment procedure when appropriate:1}  Medications Ordered in ED Medications  sodium zirconium cyclosilicate (LOKELMA) packet 10 g (has no administration in time range)  sodium chloride 0.9 % bolus 500 mL (has no administration in time range)    ED Course/ Medical Decision Making/ A&P                           Medical Decision Making Risk Prescription drug management.   Patient presents to the emergency department for hyperkalemia.  Patient does have a history of chronic renal  failure, is being monitored by her primary care doctor.  Reviewing her labs reveals that she is on spironolactone.  Daughter reports that she has been on this for many years, it is not clear why.  Daughter also reports that she does not drink much water and has been eating a lot of plantains lately.  EKG performed  at arrival does not show any is of hyperkalemia, unchanged from prior EKGs.  Repeat blood work tonight shows only mild elevation of potassium at 5.5.  BUN and creatinine are at her normal baseline.  Attempts to reach nephrology were unsuccessful.  I do not believe the patient requires hospitalization at this time.  Elevated potassium likely secondary to decreased fluid intake in the setting of spironolactone use.  Patient given IV fluid bolus and Lokelma here.  Will stop spironolactone.  Will need follow-up this week for repeat labs.  {Document critical care time when appropriate:1} {Document review of labs and clinical decision tools ie heart score, Chads2Vasc2 etc:1}  {Document your independent review of radiology images, and any outside records:1} {Document your discussion with family members, caretakers, and with consultants:1} {Document social determinants of health affecting pt's care:1} {Document your decision making why or why not admission, treatments were needed:1} Final Clinical Impression(s) / ED Diagnoses Final diagnoses:  Hyperkalemia    Rx / DC Orders ED Discharge Orders     None

## 2022-07-13 DIAGNOSIS — E875 Hyperkalemia: Secondary | ICD-10-CM | POA: Diagnosis not present

## 2022-07-13 NOTE — Discharge Instructions (Addendum)
Stop taking Spironolactone. Schedule follow up repeat labs at your doctor's office as soon as possible. Drink lots of water.

## 2022-08-05 ENCOUNTER — Ambulatory Visit (INDEPENDENT_AMBULATORY_CARE_PROVIDER_SITE_OTHER): Payer: Medicare Other | Admitting: Adult Health

## 2022-08-05 VITALS — BP 124/65 | HR 56 | Wt 149.6 lb

## 2022-08-05 DIAGNOSIS — G20C Parkinsonism, unspecified: Secondary | ICD-10-CM

## 2022-08-05 DIAGNOSIS — G4733 Obstructive sleep apnea (adult) (pediatric): Secondary | ICD-10-CM

## 2022-08-05 DIAGNOSIS — R413 Other amnesia: Secondary | ICD-10-CM | POA: Diagnosis not present

## 2022-08-05 NOTE — Progress Notes (Signed)
PATIENT: Casey Liu DOB: 19-Apr-1935  REASON FOR VISIT: follow up HISTORY FROM: patient PRIMARY NEUROLOGIST: Dr. Rexene Alberts  Chief Complaint  Patient presents with   Follow-up    Rm 5, Tim, Interpreter.  Cpap followup.     HISTORY OF PRESENT ILLNESS: Today 08/05/22  Casey Liu is a 88 y.o. female who has been followed in this office for OSA on CPAP, Parksinsons, memory disturbance. Returns today for follow-up. Daughter reports that she takes the machine off. Daughter feels that memory is not great but no changes since the last visit. Lives with her daughter. Needs help with all ADLs.   Daughter reports right hand shakes but this started after a fall when she didn't get her shoulder fixed. Daughter reports gait stable. No recent falls. Currently taking Sinemet 1 tab TID.   HISTORY 04/01/2022: She reports tingling and burning sensation in her right pinky finger.  Of note, she is on gabapentin 300 mg 3 times daily.  She is no longer on levodopa therapy, still has issues with anxiety and agitation, has had visual hallucinations and memory loss.  She has not fallen recently, she uses a walker at the house.  She uses her AutoPap, I was able to review her compliance data from 03/01/2022 through 03/30/2022, during which time she used her machine 29 days with percent use days greater than 4 hours at 67%, indicating slightly suboptimal compliance, average usage of 6 hours and 10 minutes, residual AHI elevated at 21.2/h with primarily central events with a central respiratory index of 6.6/h, average pressure for the 95th percentile at 9.6 cm with a range of 4 to 10 cm with EPR.  She has ongoing issues with forgetfulness, she has ongoing issues with tremors.    REVIEW OF SYSTEMS: Out of a complete 14 system review of symptoms, the patient complains only of the following symptoms, and all other reviewed systems are negative.   ALLERGIES: Allergies  Allergen  Reactions   Celebrex [Celecoxib] Nausea And Vomiting and Palpitations   Latex Rash   Sinemet [Carbidopa-Levodopa] Anxiety    HOME MEDICATIONS: Outpatient Medications Prior to Visit  Medication Sig Dispense Refill   acetaminophen (TYLENOL) 325 MG tablet Take 650 mg by mouth every 6 (six) hours as needed for mild pain or headache.     aspirin EC 81 MG tablet Take 81 mg by mouth daily.     atenolol (TENORMIN) 100 MG tablet Take 1 tablet (100 mg total) by mouth every evening. 90 tablet 1   carbidopa-levodopa (SINEMET IR) 25-100 MG tablet Take 1 tablet by mouth 3 (three) times daily. 90 tablet 3   COMBIGAN 0.2-0.5 % ophthalmic solution Place 1 drop into both eyes daily.     gabapentin (NEURONTIN) 300 MG capsule Take 300 mg by mouth 3 (three) times daily.     hydrochlorothiazide (MICROZIDE) 12.5 MG capsule Take 1 capsule (12.5 mg total) by mouth daily. (Patient not taking: Reported on 04/01/2022) 90 capsule 3   LUMIGAN 0.01 % SOLN Place 1 drop into both eyes at bedtime.     metFORMIN (GLUCOPHAGE) 500 MG tablet Take 500 mg by mouth 2 (two) times daily with a meal.      olmesartan (BENICAR) 40 MG tablet TAKE 1 TABLET BY MOUTH EVERY DAY 90 tablet 3   No facility-administered medications prior to visit.    PAST MEDICAL HISTORY: Past Medical History:  Diagnosis Date   Arthritis    Cataract    right   Diabetes mellitus  without complication ([place])    Family history of anesthesia complication    mother had "three heart attacks after anesthesia"   Headache(784.0)    hx of   History of stomach ulcers    Hypertension    Dr. Jonelle Sidle [phone]   Pneumonia    hx of    Seasonal allergies    Sleep apnea    cpap   Stroke ([place])    left side weakness   Urinary tract infection    hx of    PAST SURGICAL HISTORY: Past Surgical History:  Procedure Laterality Date   ABDOMINAL HYSTERECTOMY     APPENDECTOMY     BLADDER SURGERY     CATARACT EXTRACTION W/PHACO  07/07/2012   Procedure: CATARACT  EXTRACTION PHACO AND INTRAOCULAR LENS PLACEMENT ([place]);  Surgeon: Adonis Brook, MD;  Location: [facility];  Service: Ophthalmology;  Laterality: Right;   CESAREAN SECTION     x 1   CHOLECYSTECTOMY     EYE SURGERY     "seven eye surgery"   KNEE SURGERY     left knee   OVARIAN CYST REMOVAL     TOTAL KNEE ARTHROPLASTY Right 11/12/2015   Procedure: TOTAL KNEE ARTHROPLASTY;  Surgeon: Frederik Pear, MD;  Location: [facility];  Service: Orthopedics;  Laterality: Right;   TOTAL KNEE ARTHROPLASTY Left 04/23/2016   Procedure: LEFT TOTAL KNEE ARTHROPLASTY;  Surgeon: Frederik Pear, MD;  Location: [facility];  Service: Orthopedics;  Laterality: Left;   TUBAL LIGATION      FAMILY HISTORY: Family History  Problem Relation Age of Onset   Breast cancer Daughter 34   Healthy Mother    Dementia Mother    Healthy Father     SOCIAL HISTORY: Social History   Socioeconomic History   Marital status: Single    Spouse name: Not on file   Number of children: 8   Years of education: Not on file   Highest education level: Not on file  Occupational History   Not on file  Tobacco Use   Smoking status: Never   Smokeless tobacco: Never  Substance and Sexual Activity   Alcohol use: No    Comment: social hx    Drug use: No   Sexual activity: Not on file  Other Topics Concern   Not on file  Social History Narrative   Lives with daughter   Social Determinants of Health   Financial Resource Strain: Not on file  Food Insecurity: Not on file  Transportation Needs: Not on file  Physical Activity: Not on file  Stress: Not on file  Social Connections: Not on file  Intimate Partner Violence: Not on file      PHYSICAL EXAM  Vitals:   08/05/22 1140  BP: 124/65  Pulse: (!) 56  Weight: 149 lb 9.6 oz (67.9 kg)   Body mass index is 27.36 kg/m.    09/26/2021    9:09 AM  MMSE - Mini Mental State Exam  Orientation to time 0  Orientation to Place 0  Registration 3  Attention/ Calculation 1  Recall 0  Language-  name 2 objects 0  Language- repeat 1  Language- follow 3 step command 2  Language- read & follow direction 1  Write a sentence 0  Copy design 0  Total score 8    Generalized: Well developed, in no acute distress   Neurological examination  Mentation: Alert oriented to person.  Follows all commands intermittently-speech and language fluent Cranial nerve II-XII: Pupils were equal round  reactive to light. Extraocular movements were full, visual field were full on confrontational test. Facial sensation and strength were normal. Head turning and shoulder shrug  were normal and symmetric. Motor: The motor testing reveals 5 over 5 strength of all 4 extremities. Good symmetric motor tone is noted throughout.  Sensory: Sensory testing is intact to soft touch on all 4 extremities. No evidence of extinction is noted.  Coordination: Cerebellar testing reveals good finger-nose-finger and heel-to-shin bilaterally.  Gait and station: uses a rollator when ambulating.  Reflexes: Deep tendon reflexes are symmetric and normal bilaterally.   DIAGNOSTIC DATA (LABS, IMAGING, TESTING) - I reviewed patient records, labs, notes, testing and imaging myself where available.  Lab Results  Component Value Date   WBC 5.9 07/12/2022   HGB 13.1 07/12/2022   HCT 41.2 07/12/2022   MCV 97.9 07/12/2022   PLT 183 07/12/2022      Component Value Date/Time   NA 141 07/12/2022 1410   NA 142 06/27/2021 0905   K 5.5 (H) 07/12/2022 1410   CL 111 07/12/2022 1410   CO2 26 07/12/2022 1410   GLUCOSE 117 (H) 07/12/2022 1410   BUN 32 (H) 07/12/2022 1410   BUN 38 (H) 06/27/2021 0905   CREATININE 1.49 (H) 07/12/2022 1410   CALCIUM 9.6 07/12/2022 1410   PROT 6.4 (L) 07/12/2022 1410   PROT 7.4 06/27/2021 0905   ALBUMIN 3.4 (L) 07/12/2022 1410   ALBUMIN 4.6 06/27/2021 0905   AST 19 07/12/2022 1410   ALT 6 07/12/2022 1410   ALKPHOS 81 07/12/2022 1410   BILITOT 0.3 07/12/2022 1410   BILITOT 0.3 06/27/2021 0905    GFRNONAA 34 (L) 07/12/2022 1410   GFRAA 54 (L) 09/18/2019 0510   No results found for: "CHOL", "HDL", "LDLCALC", "LDLDIRECT", "TRIG", "CHOLHDL" Lab Results  Component Value Date   HGBA1C 6.2 (H) 04/15/2016   Lab Results  Component Value Date   VITAMINB12 789 06/27/2021   Lab Results  Component Value Date   TSH 2.090 06/27/2021      ASSESSMENT AND PLAN 88 y.o. year old female  has a past medical history of Arthritis, Cataract, Diabetes mellitus without complication ([place]), Family history of anesthesia complication, FWYOVZCH(885.0), History of stomach ulcers, Hypertension, Pneumonia, Seasonal allergies, Sleep apnea, Stroke ([place]), and Urinary tract infection. here with:  Primary Parkinsonism  Start Sinemet 25-100 mg TID Continue to monitor symptoms  2. Memory disturbance  Discussed medication. Not sure that Namenda would offer much benefit with previous MMSE score of 8/30. Daughter wants to discuss with her PCP d/t Kidney function  3. OSA on CPAP  CPAP compliance is suboptimal  Try to use as much as possible  FU in 6 months or sooner if neeed.    Ward Givens, MSN, NP-C 08/05/2022, 11:30 AM [facility] [address], [place], [place] [address] [phone]

## 2022-08-05 NOTE — Patient Instructions (Signed)
Your Plan:  Continue Sinemet Try using CPAP as much as possible Consider Namenda for memory If your symptoms worsen or you develop new symptoms please let us know.    Thank you for coming to see [place] at [facility]. I hope we have been able to provide you high quality care today.  You may receive a patient satisfaction survey over the next few weeks. We would appreciate your feedback and comments so that we may continue to improve ourselves and the health of our patients.

## 2022-10-30 ENCOUNTER — Ambulatory Visit: Payer: Medicare Other | Admitting: Cardiology

## 2022-10-30 ENCOUNTER — Encounter: Payer: Self-pay | Admitting: Cardiology

## 2022-10-30 VITALS — BP 130/70 | HR 61 | Resp 16 | Ht 62.0 in | Wt 144.6 lb

## 2022-10-30 DIAGNOSIS — I10 Essential (primary) hypertension: Secondary | ICD-10-CM

## 2022-10-30 DIAGNOSIS — N1832 Chronic kidney disease, stage 3b: Secondary | ICD-10-CM

## 2022-10-30 NOTE — Progress Notes (Signed)
Primary Physician/Referring:  Rometta EmeryGarba, Mohammad L, MD  Patient ID: Casey Liu, female    DOB: September 24, 1934, 88 y.o.   MRN: [id number]  Chief Complaint  Patient presents with  . Essential hypertension  . 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  Patient is extremely sensitive to multiple medications.  In view of recent difficulty with blood pressure control, patient's daughter made an appointment and wanted to have "checkup" with me.   Patient unable to give me any specific history due to dementia.  Her daughter is present, states that there are no specific complaints today.  States that her blood pressure has been well-controlled.  Denies dyspnea, chest pain, leg edema, PND or orthopnea.  Past Medical History:  Diagnosis Date  . Arthritis   . Cataract    right  . Diabetes mellitus without complication   . Family history of anesthesia complication    mother had "three heart attacks after anesthesia"  . Headache(784.0)    hx of  . History of stomach ulcers   . Hypertension    Dr. Mikeal HawthorneGarba [phone]  . Pneumonia    hx of   . Seasonal allergies   . Sleep apnea    cpap  . Stroke    left side weakness  . Urinary tract infection    hx of    Past Surgical History:  Procedure Laterality Date  . ABDOMINAL HYSTERECTOMY    . APPENDECTOMY    . BLADDER SURGERY    . CATARACT EXTRACTION W/PHACO  07/07/2012   Procedure: CATARACT EXTRACTION PHACO AND INTRAOCULAR LENS PLACEMENT (IOC);  Surgeon: Shade FloodGreer Geiger, MD;  [facility];  Service: Ophthalmology;  Laterality: Right;  . CESAREAN SECTION     x 1  . CHOLECYSTECTOMY    . EYE SURGERY     "seven eye  surgery"  . KNEE SURGERY     left knee  . OVARIAN CYST REMOVAL    . TOTAL KNEE ARTHROPLASTY Right 11/12/2015   Procedure: TOTAL KNEE ARTHROPLASTY;  Surgeon: Gean BirchwoodFrank Rowan, MD;  Location: [facility] OR;  Service: Orthopedics;  Laterality: Right;  . TOTAL KNEE ARTHROPLASTY Left 04/23/2016   Procedure: LEFT TOTAL KNEE ARTHROPLASTY;  Surgeon: Gean BirchwoodFrank Rowan, MD;  Location: [facility] OR;  Service: Orthopedics;  Laterality: Left;  . TUBAL LIGATION     Social History   Tobacco Use  . Smoking status: Never  . Smokeless tobacco: Never  Substance Use Topics  . Alcohol use: No    Comment: social hx     Review of Systems  Cardiovascular:  Positive for leg swelling. Negative for chest pain and dyspnea on exertion.   Objective  Blood pressure 130/70, pulse 61, resp. rate 16, height 5\' 2"  (1.575 m), weight 144 lb 9.6 oz (65.6 kg), SpO2 94 %. Body mass index is 26.45 kg/m.     10/30/2022    3:07 PM 08/05/2022   11:40 AM 07/13/2022    1:01 AM  Vitals with BMI  Height 5\' 2"     Weight 144 lbs 10 oz 149 lbs 10 oz   BMI 26.44    Systolic 130 124 [id number]  Diastolic 70 65 75  Pulse 61 56 60    Orthostatic VS for the past 72  hrs (Last 3 readings):  Patient Position BP Location Cuff Size  10/30/22 1507 Sitting Left Arm Normal       Physical Exam Neck:     Vascular: No carotid bruit or JVD.  Cardiovascular:     Rate and Rhythm: Normal rate and regular rhythm.     Pulses: Intact distal pulses.     Heart sounds: Normal heart sounds. No murmur heard.    No gallop.  Pulmonary:     Effort: Pulmonary effort is normal.     Breath sounds: Normal breath sounds.  Abdominal:     General: Bowel sounds are normal.     Palpations: Abdomen is soft.  Musculoskeletal:     Right lower leg: No edema.     Left lower leg: No edema.   Radiology: No results found.  Laboratory examination:   Lab Results  Component Value Date   NA 141 07/12/2022   K 5.5 (H) 07/12/2022   CO2 26 07/12/2022   GLUCOSE 117 (H) 07/12/2022   BUN  32 (H) 07/12/2022   CREATININE 1.49 (H) 07/12/2022   CALCIUM 9.6 07/12/2022   EGFR 38 (L) 06/27/2021   GFRNONAA 34 (L) 07/12/2022       Latest Ref Rng & Units 07/12/2022    2:10 PM 06/27/2021    9:05 AM 02/03/2021    6:31 PM  CMP  Glucose 70 - 99 mg/dL 992  426  834   BUN 8 - 23 mg/dL 32  38  43   Creatinine 0.44 - 1.00 mg/dL 1.96  2.22  9.79   Sodium 135 - 145 mmol/L 141  142  141   Potassium 3.5 - 5.1 mmol/L 5.5  5.2  5.4   Chloride 98 - 111 mmol/L 111  105  110   CO2 22 - 32 mmol/L 26  21  25    Calcium 8.9 - 10.3 mg/dL 9.6  89.2  9.5   Total Protein 6.5 - 8.1 g/dL 6.4  7.4    Total Bilirubin 0.3 - 1.2 mg/dL 0.3  0.3    Alkaline Phos 38 - 126 U/L 81  100    AST 15 - 41 U/L 19  16    ALT 0 - 44 U/L 6  13        Latest Ref Rng & Units 07/12/2022    2:10 PM 02/03/2021    6:31 PM 09/18/2019    5:10 AM  CBC  WBC 4.0 - 10.5 K/uL 5.9  5.0  7.3   Hemoglobin 12.0 - 15.0 g/dL 11.9  41.7  40.8   Hematocrit 36.0 - 46.0 % 41.2  38.4  36.9   Platelets 150 - 400 K/uL 183  198  249    External labs:  Cholesterol, total 188.000 M 05/05/2017 HDL 60.000 M 05/05/2017 LDL 109 Triglycerides 95.000 M 05/05/2017  Cardiac Studies:   Lexiscan Sestamibi 02/07/11: Normal perfusion without ischemia. Normal LVEF.    Echocardiogram 02/23/2019:  Normal LV systolic function with EF 54%. Left ventricle cavity is normal in size. Mild concentric hypertrophy of the left ventricle. Normal global wall motion. Doppler evidence of grade I (impaired) diastolic dysfunction, normal LAP. Calculated EF 54%. Left atrial cavity is mildly dilated at 3.9 cm in 4 chamber view. Structurally normal appearing mitral valve. Mild to moderate mitral regurgitation. Moderate tricuspid regurgitation. Mild  to moderate pulmonary hypertension. Estimated pulmonary artery systolic pressure is 41 mm Hg with RA pressure estimated at 3 mm Hg. RVSP measures . No significant change  from  Echocardiogram 07/08/2016.  EKG:  EKG  10/30/2022: Normal sinus rhythm at rate of 62 bpm, normal axis, no evidence of ischemia, normal EKG.  Compared to 08/13/2020, no significant change.   Medications and allergies   Allergies  Allergen Reactions  . Celebrex [Celecoxib] Nausea And Vomiting and Palpitations  . Latex Rash  . Sinemet [Carbidopa-Levodopa] Anxiety    Current Outpatient Medications:  .  acetaminophen (TYLENOL) 325 MG tablet, Take 650 mg by mouth every 6 (six) hours as needed for mild pain or headache., Disp: , Rfl:  .  aspirin EC 81 MG tablet, Take 81 mg by mouth daily., Disp: , Rfl:  .  atenolol (TENORMIN) 100 MG tablet, Take 1 tablet (100 mg total) by mouth every evening., Disp: 90 tablet, Rfl: 1 .  carbidopa-levodopa (SINEMET IR) 25-100 MG tablet, Take 1 tablet by mouth 3 (three) times daily., Disp: 90 tablet, Rfl: 3 .  COMBIGAN 0.2-0.5 % ophthalmic solution, Place 1 drop into both eyes daily., Disp: , Rfl:  .  gabapentin (NEURONTIN) 300 MG capsule, Take 300 mg by mouth 3 (three) times daily., Disp: , Rfl:  .  LUMIGAN 0.01 % SOLN, Place 1 drop into both eyes at bedtime., Disp: , Rfl:  .  spironolactone (ALDACTONE) 25 MG tablet, Take 25 mg by mouth daily., Disp: , Rfl:    Assessment     ICD-10-CM   1. Essential hypertension  I10 EKG 12-Lead    2. Stage 3b chronic kidney disease  N18.32       Recommendations:   Casey Liu  is a 88 y.o. 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  Patient is extremely sensitive to multiple medications.  In view of recent difficulty with blood pressure control, patient's daughter made an appointment and wanted to have "checkup" with me.  1. Essential hypertension Patient's blood pressure is under  excellent control.  She did present sometime in December 2023 with hyperkalemia noted on labs to the emergency room.  She was advised to stop taking spironolactone.  She is presently on spironolactone and also on atenolol, no change in the EKG, blood pressure under excellent control.  She has been followed by her PCP and labs have been obtained and renal function and potassium levels per patient's family is stable.  She has an appointment to see nephrology.  In view of this I did not make any changes to her medications.  - EKG 12-Lead  2. Stage 3b chronic kidney disease  I reviewed her labs, appears that she has relatively stable stage III chronic kidney disease.  Patient is extremely frail, severe dementia, could not give me any history.  Her daughter gives me the history.  No clinical evidence of heart failure, in view of her advanced age, prior stroke, severe dementia, would recommend very conservative therapy and consider even palliative care.  I will see her back on a as needed basis.   Yates Decamp, MD, [facility] 10/30/2022, 3:37 PM Office: [phone] Fax: [phone] Pager: [phone]

## 2022-11-27 ENCOUNTER — Other Ambulatory Visit: Payer: Self-pay | Admitting: Nephrology

## 2022-11-27 DIAGNOSIS — N1832 Chronic kidney disease, stage 3b: Secondary | ICD-10-CM

## 2022-11-28 ENCOUNTER — Ambulatory Visit
Admission: RE | Admit: 2022-11-28 | Discharge: 2022-11-28 | Disposition: A | Discharge status: Home / Self Care | Payer: Medicare Other | Source: Ambulatory Visit | Attending: Nephrology | Admitting: Nephrology

## 2022-11-28 DIAGNOSIS — N1832 Chronic kidney disease, stage 3b: Secondary | ICD-10-CM

## 2023-02-06 ENCOUNTER — Other Ambulatory Visit: Payer: Self-pay | Admitting: Neurology

## 2023-02-12 NOTE — Progress Notes (Signed)
PATIENT: Casey Liu DOB: 1934/11/25  REASON FOR VISIT: follow up HISTORY FROM: patient, daughter, interpretor  PRIMARY NEUROLOGIST: Dr. Frances Furbish  Chief Complaint  Patient presents with   RM 9    Patient is here with daughter and Spanish interpreter, Mikle Bosworth.  Pt's daughter states she is forgetful. She is forgetting names of family members for example. Her long term memory is better. She talks about her mother a lot. She has been mean to her sister at times. Patient states daughter controls her. She takes her cpap off. ESS 15     HISTORY OF PRESENT ILLNESS: Today 02/16/23  Casey Liu is a 88 y.o. female with a history of OSA on CPAP, Parksinsons, memory disturbance.. Returns today for follow-up.   OSA on CPAP: DL is below. Doesn't always keep her CPAP on. Falls asleep sometimes without putting it on. Her daughter will have to out it on her.  Parkinson's disease: remains on sinemet 1 tablet TID. Continues  with tremor in the right hand. Daughter states this started after a shoulder injury on the right. Some times needs assistance with ambulation. Uses a Rolator. Had a fall going to the bathroom- daughter couldn't hold her- she was lowered to the floor. No injuries.   Memory: daughter reports memory has worsened. Needs assistance with ADLs. No longer driving. Needs assistance with medications, appointments and finances.      Casey Liu is a 88 y.o. female who has been followed in this office for OSA on CPAP, Parksinsons, memory disturbance. Returns today for follow-up. Daughter reports that she takes the machine off. Daughter feels that memory is not great but no changes since the last visit. Lives with her daughter. Needs help with all ADLs.   Daughter reports right hand shakes but this started after a fall when she didn't get her shoulder fixed. Daughter reports gait stable. No recent falls. Currently taking Sinemet 1 tab TID.    HISTORY 04/01/2022: She reports tingling and burning sensation in her right pinky finger.  Of note, she is on gabapentin 300 mg 3 times daily.  She is no longer on levodopa therapy, still has issues with anxiety and agitation, has had visual hallucinations and memory loss.  She has not fallen recently, she uses a walker at the house.  She uses her AutoPap, I was able to review her compliance data from 03/01/2022 through 03/30/2022, during which time she used her machine 29 days with percent use days greater than 4 hours at 67%, indicating slightly suboptimal compliance, average usage of 6 hours and 10 minutes, residual AHI elevated at 21.2/h with primarily central events with a central respiratory index of 6.6/h, average pressure for the 95th percentile at 9.6 cm with a range of 4 to 10 cm with EPR.  She has ongoing issues with forgetfulness, she has ongoing issues with tremors.    REVIEW OF SYSTEMS: Out of a complete 14 system review of symptoms, the patient complains only of the following symptoms, and all other reviewed systems are negative.  ESS 15 FSS 18   ALLERGIES: Allergies  Allergen Reactions   Celebrex [Celecoxib] Nausea And Vomiting and Palpitations   Latex Rash   Sinemet [Carbidopa-Levodopa] Anxiety    HOME MEDICATIONS: Outpatient Medications Prior to Visit  Medication Sig Dispense Refill   acetaminophen (TYLENOL) 325 MG tablet Take 650 mg by mouth every 6 (six) hours as needed for mild pain or headache.     aspirin EC 81 MG tablet Take  81 mg by mouth daily.     atenolol (TENORMIN) 100 MG tablet Take 1 tablet (100 mg total) by mouth every evening. 90 tablet 1   carbidopa-levodopa (SINEMET IR) 25-100 MG tablet TAKE 1 TABLET BY MOUTH THREE TIMES A DAY 270 tablet 1   COMBIGAN 0.2-0.5 % ophthalmic solution Place 1 drop into both eyes daily.     gabapentin (NEURONTIN) 300 MG capsule Take 300 mg by mouth 3 (three) times daily.     LUMIGAN 0.01 % SOLN Place 1 drop into both eyes at  bedtime.     spironolactone (ALDACTONE) 25 MG tablet Take 25 mg by mouth daily.     No facility-administered medications prior to visit.    PAST MEDICAL HISTORY: Past Medical History:  Diagnosis Date   Arthritis    Cataract    right   Diabetes mellitus without complication (HCC)    Family history of anesthesia complication    mother had "three heart attacks after anesthesia"   Headache(784.0)    hx of   History of stomach ulcers    Hypertension    Dr. Mikeal Hawthorne [phone]   Pneumonia    hx of    Seasonal allergies    Sleep apnea    cpap   Stroke (HCC)    left side weakness   Urinary tract infection    hx of    PAST SURGICAL HISTORY: Past Surgical History:  Procedure Laterality Date   ABDOMINAL HYSTERECTOMY     APPENDECTOMY     BLADDER SURGERY     CATARACT EXTRACTION W/PHACO  07/07/2012   Procedure: CATARACT EXTRACTION PHACO AND INTRAOCULAR LENS PLACEMENT (IOC);  Surgeon: Shade Flood, MD;  Location: [facility] OR;  Service: Ophthalmology;  Laterality: Right;   CESAREAN SECTION     x 1   CHOLECYSTECTOMY     EYE SURGERY     "seven eye surgery"   KNEE SURGERY     left knee   OVARIAN CYST REMOVAL     TOTAL KNEE ARTHROPLASTY Right 11/12/2015   Procedure: TOTAL KNEE ARTHROPLASTY;  Surgeon: Gean Birchwood, MD;  Location: [facility] OR;  Service: Orthopedics;  Laterality: Right;   TOTAL KNEE ARTHROPLASTY Left 04/23/2016   Procedure: LEFT TOTAL KNEE ARTHROPLASTY;  Surgeon: Gean Birchwood, MD;  Location: [facility] OR;  Service: Orthopedics;  Laterality: Left;   TUBAL LIGATION      FAMILY HISTORY: Family History  Problem Relation Age of Onset   Breast cancer Daughter 72   Healthy Mother    Dementia Mother    Healthy Father     SOCIAL HISTORY: Social History   Socioeconomic History   Marital status: Single    Spouse name: Not on file   Number of children: 8   Years of education: Not on file   Highest education level: Not on file  Occupational History   Not on file  Tobacco Use   Smoking  status: Never   Smokeless tobacco: Never  Substance and Sexual Activity   Alcohol use: No    Comment: social hx    Drug use: No   Sexual activity: Not on file  Other Topics Concern   Not on file  Social History Narrative   Lives with daughter   Social Determinants of Health   Financial Resource Strain: Not on file  Food Insecurity: Not on file  Transportation Needs: Not on file  Physical Activity: Not on file  Stress: Not on file  Social Connections: Unknown (12/02/2021)   [facility]   Social Network    Social Network: Not on file  Intimate Partner Violence: Unknown (10/24/2021)   Received from [facility]   HITS    Physically Hurt: Not on file    Insult or Talk Down To: Not on file    Threaten Physical Harm: Not on file    Scream or Curse: Not on file      PHYSICAL EXAM  There were no vitals filed for this visit.  There is no height or weight on file to calculate BMI.    09/26/2021    9:09 AM  MMSE - Mini Mental State Exam  Orientation to time 0  Orientation to Place 0  Registration 3  Attention/ Calculation 1  Recall 0  Language- name 2 objects 0  Language- repeat 1  Language- follow 3 step command 2  Language- read & follow direction 1  Write a sentence 0  Copy design 0  Total score 8    Generalized: Well developed, in no acute distress   Neurological examination  Mentation: Alert oriented to person.  Follows all commands intermittently-speech and language fluent Cranial nerve II-XII: Pupils were equal round reactive to light. Extraocular movements were full, visual field were full on confrontational test. Facial sensation and strength were normal. Head turning and shoulder shrug  were normal and symmetric. Motor: The motor testing reveals 5 over 5 strength of all 4 extremities. Good symmetric motor tone is noted throughout.  Sensory: Sensory testing is intact to soft touch on all 4 extremities. No evidence of extinction is noted.   Coordination: Cerebellar testing reveals good finger-nose-finger and heel-to-shin bilaterally.  Gait and station: uses a rollator when ambulating.  Reflexes: Deep tendon reflexes are symmetric and normal bilaterally.   DIAGNOSTIC DATA (LABS, IMAGING, TESTING) - I reviewed patient records, labs, notes, testing and imaging myself where available.  Lab Results  Component Value Date   WBC 5.9 07/12/2022   HGB 13.1 07/12/2022   HCT 41.2 07/12/2022   MCV 97.9 07/12/2022   PLT 183 07/12/2022      Component Value Date/Time   NA 141 07/12/2022 1410   NA 142 06/27/2021 0905   K 5.5 (H) 07/12/2022 1410   CL 111 07/12/2022 1410   CO2 26 07/12/2022 1410   GLUCOSE 117 (H) 07/12/2022 1410   BUN 32 (H) 07/12/2022 1410   BUN 38 (H) 06/27/2021 0905   CREATININE 1.49 (H) 07/12/2022 1410   CALCIUM 9.6 07/12/2022 1410   PROT 6.4 (L) 07/12/2022 1410   PROT 7.4 06/27/2021 0905   ALBUMIN 3.4 (L) 07/12/2022 1410   ALBUMIN 4.6 06/27/2021 0905   AST 19 07/12/2022 1410   ALT 6 07/12/2022 1410   ALKPHOS 81 07/12/2022 1410   BILITOT 0.3 07/12/2022 1410   BILITOT 0.3 06/27/2021 0905   GFRNONAA 34 (L) 07/12/2022 1410   GFRAA 54 (L) 09/18/2019 0510    Lab Results  Component Value Date   HGBA1C 6.2 (H) 04/15/2016   Lab Results  Component Value Date   VITAMINB12 789 06/27/2021   Lab Results  Component Value Date   TSH 2.090 06/27/2021      ASSESSMENT AND PLAN 88 y.o. year old female  has a past medical history of Arthritis, Cataract, Diabetes mellitus without complication (HCC), Family history of anesthesia complication, Headache(784.0), History of stomach ulcers, Hypertension, Pneumonia, Seasonal allergies, Sleep apnea, Stroke (HCC), and Urinary tract infection. here with:  Primary Parkinsonism  Continue Sinemet 25-100 mg TID Continue to monitor symptoms  2. Memory disturbance   MMSE score of 8/30.   3. OSA on CPAP  CPAP compliance is suboptimal  Will change pressure to 9 cm  H20 Try to use as much as possible  FU in 6 months or sooner if neeed.    Butch Penny, MSN, NP-C 02/16/2023, 1:59 PM [facility] [address] [place], [place] [address] [phone]

## 2023-02-16 ENCOUNTER — Encounter: Payer: Self-pay | Admitting: Adult Health

## 2023-02-16 ENCOUNTER — Ambulatory Visit: Payer: Medicare Other | Admitting: Adult Health

## 2023-02-16 VITALS — BP 105/69 | HR 65 | Ht 66.0 in | Wt 141.0 lb

## 2023-02-16 DIAGNOSIS — G20C Parkinsonism, unspecified: Secondary | ICD-10-CM | POA: Diagnosis not present

## 2023-02-16 DIAGNOSIS — G4733 Obstructive sleep apnea (adult) (pediatric): Secondary | ICD-10-CM | POA: Diagnosis not present

## 2023-02-16 DIAGNOSIS — R413 Other amnesia: Secondary | ICD-10-CM | POA: Diagnosis not present

## 2023-02-16 NOTE — Patient Instructions (Addendum)
Continue using CPAP nightly and greater than 4 hours each night. Will change pressure to set pressure of 9 Continue Sinemet 1 tablet three times a day If your symptoms worsen or you develop new symptoms please let us know.

## 2023-02-18 NOTE — Progress Notes (Addendum)
Order for pressure change sent to [facility]. [facility] confirmed receipt of order.

## 2023-08-19 ENCOUNTER — Ambulatory Visit (INDEPENDENT_AMBULATORY_CARE_PROVIDER_SITE_OTHER): Payer: Medicare Other | Admitting: Neurology

## 2023-08-19 ENCOUNTER — Encounter: Payer: Self-pay | Admitting: Neurology

## 2023-08-19 VITALS — BP 115/67 | HR 64 | Ht 66.0 in | Wt 141.0 lb

## 2023-08-19 DIAGNOSIS — G4733 Obstructive sleep apnea (adult) (pediatric): Secondary | ICD-10-CM | POA: Diagnosis not present

## 2023-08-19 DIAGNOSIS — G20C Parkinsonism, unspecified: Secondary | ICD-10-CM | POA: Diagnosis not present

## 2023-08-19 DIAGNOSIS — R413 Other amnesia: Secondary | ICD-10-CM | POA: Diagnosis not present

## 2023-08-19 NOTE — Patient Instructions (Signed)
Please continue with your Parkinson's medication.  Please check if you are still taking donepezil for your memory.  Please talk to your primary care if you still need to take the gabapentin. I would recommend you try to reduce and possibly come off of it, as it is sedating.

## 2023-08-19 NOTE — Progress Notes (Signed)
Subjective:    Patient ID: Casey Liu is a 89 y.o. female.  HPI    Interim history:   [name] is an 89 year old right-handed woman with an underlying medical history of hypertension, pulmonary hypertension, type 2 diabetes, reflux disease, seasonal allergies, history of pneumonia, arthritis, status post left total knee arthroplasty on 04/23/2016, status post right total knee arthroplasty on 11/12/2015, and OSA, who presents for follow-up consultation of her sleep apnea and parkinsonism associated with memory loss.  The patient is accompanied by her daughter and a, Spanish interpreter, today.  She was last seen in this clinic about 6 months ago by Butch Penny, NP, at which time she was noncompliant with her AutoPap.  Her settings were changed to 9 cm for better tolerance.  She was forgetful, her MMSE was estimated to be 8/30.  She was advised to continue with Sinemet 1 pill 3 times daily.  Today, 08/19/2023: She appears drowsy today and keeps her eyes closed for most of the visit, not verbal today, history is provided by her daughter.  Daughter reports that every day is a little different, sometimes she sleeps well at night and sometimes she is up until after midnight, sometimes he has some behavioral changes including not wanting to get out of bed and resisting help.  Appetite is fairly good.  She continues to take gabapentin 300 mg 3 times daily, unclear why she takes it.  Daughter is not completely sure if she is still on donepezil 5 mg at bedtime.  She will check at home.   Previously:   02/16/2023 Butch Penny, NP): <<[name] is a 89 y.o. female with a history of OSA on CPAP, Parksinsons, memory disturbance.. Returns today for follow-up.  OSA on CPAP: DL is below. Doesn't always keep her CPAP on. Falls asleep sometimes without putting it on. Her daughter will have to out it on her.   Parkinson's disease: remains on sinemet 1 tablet  TID. Continues  with tremor in the right hand. Daughter states this started after a shoulder injury on the right. Some times needs assistance with ambulation. Uses a Rolator. Had a fall going to the bathroom- daughter couldn't hold her- she was lowered to the floor. No injuries.    Memory: daughter reports memory has worsened. Needs assistance with ADLs. No longer driving. Needs assistance with medications, appointments and finances.  >> 08/05/2022 Butch Penny, NP): <<Casey Liu is a 88 y.o. female who has been followed in this office for OSA on CPAP, Parksinsons, memory disturbance. Returns today for follow-up. Daughter reports that she takes the machine off. Daughter feels that memory is not great but no changes since the last visit. Lives with her daughter. Needs help with all ADLs.    Daughter reports right hand shakes but this started after a fall when she didn't get her shoulder fixed. Daughter reports gait stable. No recent falls. Currently taking Sinemet 1 tab TID. >> 04/01/2022 (SA): She reports tingling and burning sensation in her right pinky finger.  Of note, she is on gabapentin 300 mg 3 times daily.  She is no longer on levodopa therapy, still has issues with anxiety and agitation, has had visual hallucinations and memory loss.  She has not fallen recently, she uses a walker at the house.  She uses her AutoPap, I was able to review her compliance data from 03/01/2022 through 03/30/2022, during which time she used her machine 29 days with percent use days greater than 4 hours at  67%, indicating slightly suboptimal compliance, average usage of 6 hours and 10 minutes, residual AHI elevated at 21.2/h with primarily central events with a central respiratory index of 6.6/h, average pressure for the 95th percentile at 9.6 cm with a range of 4 to 10 cm with EPR.  She has ongoing issues with forgetfulness, she has ongoing issues with tremors.    I saw her on 06/27/2021, at which  time she was referred by her primary care provider for evaluation of memory loss.  Examination was consistent with parkinsonism.  She had right-sided lateralization.  She was advised to start Sinemet.  She was advised to be consistent with her AutoPap machine.  She was advised to proceed with a brain MRI.  She had a brain MRI without contrast on 08/08/2021 and I reviewed the results:   IMPRESSION: This MRI of the brain with and without contrast shows the following: 1.   The brain appears normal for age with fairly mild generalized cortical atrophy and normal signal. 2.   The pituitary gland is larger than typical for age but unchanged compared to the 09/18/2019 MRI.  This is likely an incidental finding. 3.   Mild bilateral mastoid effusions.  4.   No acute findings.     The patient's daughter called in the interim in January 2023 reporting that the patient had more anxiety on the medication and they stopped it.   She saw Ihor Austin, NP, in the interim on 09/26/2021, at which time she was suboptimal with her AutoPap compliance, residual AHI was elevated at 19 with primarily central events.  Her MMSE was 8.  The potential use of memory medication was discussed at the time.  She was encouraged to be compliant with her AutoPap machine.      I saw her on 05/16/2020, at which time she was unfortunately not compliant with her AutoPap machine.  She had been compliant with her AutoPap in the past.  She stopped using her machine after developing a cough but was motivated to restart it.  She was advised to follow-up in 6 months to see the nurse practitioner.   I reviewed her AutoPap compliance data for the past 30 days, she has not been consistent with her AutoPap usage, percent use days greater than 4 hours at 27% only.  Average usage for days on treatment of 4 hours and 36 minutes, residual AHI 10.6/h, pressure for the 95th percentile at 11.4 cm with a range of 5 to 15 cm.       She saw Butch Penny,  NP, in the interim on 06/16/2017, at which time she was doing well, tolerating therapy and was compliant with CPAP.   She saw Butch Penny on 12/16/2017, at which time her residual AHI was 10.4/h on a pressure of 9 cm.  She was compliant with treatment.  She was changed to AutoPap therapy on a pressure range of 5 to 15 cm.   She saw Butch Penny, NP, on 06/30/2018 at which time her compliance has declined a little bit.  She was reporting allergy symptoms and not using her CPAP all the time.  She had needed a piece of supply from her DME company which eventually was replaced.  She was advised to continue with treatment.     She was seen by Butch Penny on 02/08/2019, at which time she was suboptimal with her compliance for more than 4 hours.  Residual AHI was at goal on AutoPap.  She did have a higher  leak.   I reviewed her AutoPap compliance data for the past 90 days from 02/14/2020 through 05/13/2020, during which time the patient used her machine only 7 days with percent use days greater than 4 hours at 3% only, indicating very low compliance, essentially no usage after 03/12/2020.     I first met her on 07/09/2016 at the request of her cardiologist, at which time she reported a prior diagnosis of obstructive sleep apnea. She had an older CPAP machine. She had some morning headaches. I suggested we proceed with sleep study testing. She had a baseline sleep study, followed by a CPAP titration study. I went over her test results with her in detail today. Her baseline sleep study from 07/24/2016 showed a sleep efficiency of 78.1%, sleep latency 23 minutes, REM latency markedly delayed at 261.5 minutes. She had a decreased percentage of REM sleep, slow-wave sleep was absent, marked increase in stage II sleep. Total AHI was 28.2 per hour, REM AHI 67.8 per hour, supine AHI 30.9 per hour. Average oxygen saturation was 96%, nadir was 73%. She had no significant PLMS. Based on her medical history, her  sleep-related complaints and her sleep study results I invited her for a full night CPAP titration study. She had this on 08/14/2016. Sleep efficiency was 59%, sleep latency 9.5 minutes, REM latency markedly prolonged at 319.5 minutes. She had a normal amount of slow-wave sleep, REM sleep was 12.5%. CPAP was titrated from 5 cm to 7 cm. Since she did not have complete elimination of her sleep disordered breathing I suggested a CPAP pressure of 8 cm at home. She did not have any significant PLMS, baseline oxygen saturation was 90%, nadir was 85%.   I reviewed her CPAP compliance data from 11/10/2016 through 12/09/2016, which is a total of 30 days, during which time she used her machine 29 days with percent used days greater than 4 hours at 90%, indicating excellent compliance with an average usage of 6 hours and 35 minutes, residual AHI is slightly suboptimal at 7.2 per hour, leak on the high side with the 95th percentile at 32.6 L/m on a pressure of 8 cm with EPR of 3.      07/09/2016: She was previously diagnosed with obstructive sleep apnea and placed on CPAP therapy. Sleep study testing was over 5 years ago, prior sleep study results are not available for my review. I reviewed your office note from 06/23/2016, which you kindly included.  The CPAP download is not available for my review today. Her machine is about 87 years old she estimates. She has been using a nasal mask which breaks easily. Prior to that she was prescribed a full face mask which she preferred. With CPAP she has been sleeping better, without CPAP she has a history of loud snoring. She has been waking up with headaches frequently. These are not severe or debilitating, dull and achy typically. She denies any restless leg symptoms but has residual knee pain bilaterally, left more than right. She has a cane available and also a walker but typically uses her cane only. She reports a bedtime of around 9 PM and while she falls asleep fairly  quickly, she is often awake right after midnight and has trouble going back to sleep. Sleep was severely disrupted by multiple bathroom breaks, she says that she woke up to the bathroom up to 10 times per night. She has a bedside commode. She is typically out of bed by 7:15 AM. She occasionally naps. Epworth  sleepiness score is 9 out of 24, fatigue score is 57 out of 63. She lives with one of her daughters. She has a total of 7 children. She does not smoke or drink alcohol or use caffeine on a daily basis. She has not been able to use her CPAP for at least 2 weeks. She complains of discomfort with the mask and machine is not working properly.    Her Past Medical History Is Significant For: Past Medical History:  Diagnosis Date   Arthritis    Cataract    right   Diabetes mellitus without complication (HCC)    Family history of anesthesia complication    mother had "three heart attacks after anesthesia"   Headache(784.0)    hx of   History of stomach ulcers    Hypertension    Dr. Mikeal Hawthorne [phone]   Kidney problem    seeing nephrology   Pneumonia    hx of    Seasonal allergies    Sleep apnea    cpap   Stroke (HCC)    left side weakness   Urinary tract infection    hx of    Her Past Surgical History Is Significant For: Past Surgical History:  Procedure Laterality Date   ABDOMINAL HYSTERECTOMY     APPENDECTOMY     BLADDER SURGERY     CATARACT EXTRACTION W/PHACO  07/07/2012   Procedure: CATARACT EXTRACTION PHACO AND INTRAOCULAR LENS PLACEMENT (IOC);  Surgeon: Shade Flood, MD;  Location: [facility] OR;  Service: Ophthalmology;  Laterality: Right;   CESAREAN SECTION     x 1   CHOLECYSTECTOMY     EYE SURGERY     "seven eye surgery"   KNEE SURGERY     left knee   OVARIAN CYST REMOVAL     TOTAL KNEE ARTHROPLASTY Right 11/12/2015   Procedure: TOTAL KNEE ARTHROPLASTY;  Surgeon: Gean Birchwood, MD;  Location: [facility] OR;  Service: Orthopedics;  Laterality: Right;   TOTAL KNEE ARTHROPLASTY Left  04/23/2016   Procedure: LEFT TOTAL KNEE ARTHROPLASTY;  Surgeon: Gean Birchwood, MD;  Location: [facility] OR;  Service: Orthopedics;  Laterality: Left;   TUBAL LIGATION      Her Family History Is Significant For: Family History  Problem Relation Age of Onset   Breast cancer Daughter 4   Healthy Mother    Dementia Mother    Healthy Father     Her Social History Is Significant For: Social History   Socioeconomic History   Marital status: Single    Spouse name: Not on file   Number of children: 8   Years of education: Not on file   Highest education level: Not on file  Occupational History   Not on file  Tobacco Use   Smoking status: Never   Smokeless tobacco: Never  Substance and Sexual Activity   Alcohol use: No    Comment: social hx    Drug use: No   Sexual activity: Not on file  Other Topics Concern   Not on file  Social History Narrative   Lives with daughter   Social Drivers of Health   Financial Resource Strain: Not on file  Food Insecurity: Not on file  Transportation Needs: Not on file  Physical Activity: Not on file  Stress: Not on file  Social Connections: Unknown (12/02/2021)   [facility]   Social Network    Social Network: Not on file    Her Allergies Are:  Allergies  Allergen Reactions  Celebrex [Celecoxib] Nausea And Vomiting and Palpitations   Latex Rash   Sinemet [Carbidopa-Levodopa] Anxiety  :   Her Current Medications Are:  Outpatient Encounter Medications as of 08/19/2023  Medication Sig   acetaminophen (TYLENOL) 325 MG tablet Take 650 mg by mouth every 6 (six) hours as needed for mild pain or headache.   aspirin EC 81 MG tablet Take 81 mg by mouth daily.   carbidopa-levodopa (SINEMET IR) 25-100 MG tablet TAKE 1 TABLET BY MOUTH THREE TIMES A DAY   COMBIGAN 0.2-0.5 % ophthalmic solution Place 1 drop into both eyes daily.   gabapentin (NEURONTIN) 300 MG capsule Take 300 mg by mouth 3 (three) times daily.   LUMIGAN  0.01 % SOLN Place 1 drop into both eyes at bedtime.   atenolol (TENORMIN) 100 MG tablet Take 1 tablet (100 mg total) by mouth every evening. (Patient not taking: Reported on 08/19/2023)   donepezil (ARICEPT) 5 MG tablet Take 5 mg by mouth at bedtime. (Patient not taking: Reported on 08/19/2023)   spironolactone (ALDACTONE) 25 MG tablet Take 25 mg by mouth daily. (Patient not taking: Reported on 08/19/2023)   traZODone (DESYREL) 50 MG tablet Take 50 mg by mouth at bedtime. (Patient not taking: Reported on 08/19/2023)   No facility-administered encounter medications on file as of 08/19/2023.  :  Review of Systems:  Out of a complete 14 point review of systems, all are reviewed and negative with the exception of these symptoms as listed below:  Review of Systems  Neurological:        She has been agitated, more aggressive recently. State of mind same.  Tremors come and go,  (more when eating).  Uses cpap. (Takes off sometimes at night). ESS 15.    Objective:  Neurological Exam  Physical Exam Physical Examination:   Vitals:   08/19/23 1004  BP: 115/67  Pulse: 64    General Examination: The patient is a very pleasant 89 y.o. female in no acute distress. She is frail appearing, in wheelchair.  Well-groomed.   HEENT: Normocephalic, atraumatic, pupils are equal, round and reactive to light.  Extraocular tracking is clearly impaired, keeps her eyes closed most of the visit, mild to moderate facial masking noted, severe nuchal rigidity noted, decreased eye blink rate, no carotid bruits, mild to moderate mouth dryness noted, minimal tongue protrusion.    Chest: Clear to auscultation without wheezing, rhonchi or crackles noted.   Heart: S1+S2+0, regular and normal without murmurs, rubs or gallops noted.    Abdomen: Soft, non-tender and non-distended.   Extremities: There is mild to moderate swelling in the distal lower extremities, wearing compression stockings.    Skin: Warm and dry without  trophic changes noted. There are no varicose veins.   Musculoskeletal: exam reveals bony step in the right shoulder, limited range of motion of the shoulder and hip joints.    Neurologically:  Mental status: The patient is somnolent, does not pay full attention today, unable to provide any history and is essentially nonverbal today.  Difficulty following even simple commands, even with me making.   Cranial nerves II - XII are as described above under HEENT exam. Right shoulder height is decreased compared to left.  Motor exam: Normal bulk, global strength of about 4 out of 5, moderate resting tremor in the right upper extremity, milder and intermittent resting tremor in the left upper extremity.  Increased tone in both upper extremities, cogwheeling on the right side.   On fine motor testing, she  has moderate difficulty on the right, better on the left.   Cerebellar testing: No dysmetria or intention tremor.  Sensory exam: Appears intact to light touch in the UEs.  Gait, station and balance: In wheelchair, no walking aid, I did not have her stand or walk for me today.     Assessment and Plan:  In summary, Lilianah Buffin is a very pleasant 89 year old right-handed woman with an underlying medical history of hypertension, pulmonary hypertension, type 2 diabetes, reflux disease, seasonal allergies, history of pneumonia, arthritis, status post left total knee arthroplasty on 04/23/2016, status post right total knee arthroplasty on 11/12/2015, and OSA, who presents for follow-up consultation of her sleep apnea and parkinsonism associated with memory loss.  Her memory loss has been progressive.  Not clear if she is still on donepezil at this time.  She is quite somnolent today, I wonder if she is sleepy from the gabapentin.  I advised her daughter to double check with primary care if she still needs to be on gabapentin 300 mg 3 times daily, I would favor tapering it down or off completely even.  I  think the focus should be on supportive care, fall prevention, good hydration and nutrition.  I recommend continuing with the CPAP of 9 cm, her recent compliance data shows excellent compliance.  Nasal mask on but slightly suboptimal with keeping it on more than 4 hours, 57% currently improved this is actually better than at the last appointment.  She is advised to continue with Sinemet 1 pill 3 times daily.  They are going to check if she is still on donepezil and let us know.  She is advised to follow-up routinely in this office to see the nurse practitioner in about 6 months, sooner if needed.  I answered all their questions today and the patient and her daughter were in agreement.  Of note, her brain MRI in January 2023 showed mild generalized atrophy, no telltale findings that can be seen with Alzheimer's type dementia.  I spent 30 minutes in total face-to-face time and in reviewing records during pre-charting, more than 50% of which was spent in counseling and coordination of care, reviewing test results, reviewing medications and treatment regimen and/or in discussing or reviewing the diagnosis of parkinsonism, memory loss, the prognosis and treatment options. Pertinent laboratory and imaging test results that were available during this visit with the patient were reviewed by me and considered in my medical decision making (see chart for details).

## 2023-09-01 ENCOUNTER — Encounter: Payer: Self-pay | Admitting: Adult Health

## 2023-09-09 ENCOUNTER — Other Ambulatory Visit: Payer: Self-pay | Admitting: Adult Health

## 2023-09-18 ENCOUNTER — Encounter: Payer: Self-pay | Admitting: Podiatry

## 2023-09-18 ENCOUNTER — Ambulatory Visit (INDEPENDENT_AMBULATORY_CARE_PROVIDER_SITE_OTHER): Payer: Medicare Other | Admitting: Podiatry

## 2023-09-18 DIAGNOSIS — L97522 Non-pressure chronic ulcer of other part of left foot with fat layer exposed: Secondary | ICD-10-CM

## 2023-09-18 DIAGNOSIS — L97409 Non-pressure chronic ulcer of unspecified heel and midfoot with unspecified severity: Secondary | ICD-10-CM

## 2023-09-18 DIAGNOSIS — M2041 Other hammer toe(s) (acquired), right foot: Secondary | ICD-10-CM | POA: Diagnosis not present

## 2023-09-18 DIAGNOSIS — E11621 Type 2 diabetes mellitus with foot ulcer: Secondary | ICD-10-CM | POA: Diagnosis not present

## 2023-09-18 DIAGNOSIS — M2042 Other hammer toe(s) (acquired), left foot: Secondary | ICD-10-CM

## 2023-09-18 NOTE — Progress Notes (Signed)
 Subjective:  Patient ID: Casey Liu, female    DOB: 1934/10/18,  MRN: [id number]  Chief Complaint  Patient presents with   Wound Check    89 y.o. female presents for wound care.  Patient presents with left heel ulceration with fat layer exposed painful to touch is progressive gotten worse worse with ambulation worse with pressure she states that this came out of nowhere wanted get evaluated she is here with her family today.  She also has hammertoe contractures.  She would like to discuss diabetic shoes denies any other acute issues   Review of Systems: Negative except as noted in the HPI. Denies N/V/F/Ch.  Past Medical History:  Diagnosis Date   Arthritis    Cataract    right   Diabetes mellitus without complication (HCC)    Family history of anesthesia complication    mother had "three heart attacks after anesthesia"   Headache(784.0)    hx of   History of stomach ulcers    Hypertension    Dr. Mikeal Hawthorne [phone]   Kidney problem    seeing nephrology   Pneumonia    hx of    Seasonal allergies    Sleep apnea    cpap   Stroke [facility])    left side weakness   Urinary tract infection    hx of    Current Outpatient Medications:    acetaminophen (TYLENOL) 325 MG tablet, Take 650 mg by mouth every 6 (six) hours as needed for mild pain or headache., Disp: , Rfl:    aspirin EC 81 MG tablet, Take 81 mg by mouth daily., Disp: , Rfl:    atenolol (TENORMIN) 100 MG tablet, Take 1 tablet (100 mg total) by mouth every evening. (Patient not taking: Reported on 08/19/2023), Disp: 90 tablet, Rfl: 1   carbidopa-levodopa (SINEMET IR) 25-100 MG tablet, TAKE 1 TABLET BY MOUTH THREE TIMES A DAY, Disp: 270 tablet, Rfl: 1   COMBIGAN 0.2-0.5 % ophthalmic solution, Place 1 drop into both eyes daily., Disp: , Rfl:    donepezil (ARICEPT) 5 MG tablet, Take 5 mg by mouth at bedtime. (Patient not taking: Reported on 08/19/2023), Disp: , Rfl:    gabapentin (NEURONTIN) 300 MG capsule,  Take 300 mg by mouth 3 (three) times daily., Disp: , Rfl:    LUMIGAN 0.01 % SOLN, Place 1 drop into both eyes at bedtime., Disp: , Rfl:    spironolactone (ALDACTONE) 25 MG tablet, Take 25 mg by mouth daily. (Patient not taking: Reported on 08/19/2023), Disp: , Rfl:    traZODone (DESYREL) 50 MG tablet, Take 50 mg by mouth at bedtime. (Patient not taking: Reported on 08/19/2023), Disp: , Rfl:   Social History   Tobacco Use  Smoking Status Never  Smokeless Tobacco Never    Allergies  Allergen Reactions   Celebrex [Celecoxib] Nausea And Vomiting and Palpitations   Latex Rash   Sinemet [Carbidopa-Levodopa] Anxiety   Objective:  There were no vitals filed for this visit. There is no height or weight on file to calculate BMI. Constitutional Well developed. Well nourished.  Vascular Dorsalis pedis pulses palpable bilaterally. Posterior tibial pulses palpable bilaterally. Capillary refill normal to all digits.  No cyanosis or clubbing noted. Pedal hair growth normal.  Neurologic Normal speech. Oriented to person, place, and time. Protective sensation absent  Dermatologic Wound Location: Left heel ulcer fat layer exposed Wound Base: Mixed Granular/Fibrotic Peri-wound: Calloused Exudate: Scant/small amount Serosanguinous exudate Wound Measurements: -See below  Orthopedic: No pain to palpation either  foot.   Radiographs: None Assessment:   1. Hammertoe, bilateral   2. Type 2 diabetes, controlled, with ulcer of heel (HCC)   3. Chronic foot ulcer with fat layer exposed, left (HCC)    Plan:  Patient was evaluated and treated and all questions answered.  Hammertoe bilateral -All questions and concerns were discussed with the patient extensive detail given the present hammertoe contracture in setting of ulceration and history of ulceration patient will benefit from diabetic shoes.  She will be scheduled to see Trish for diabetic shoes  Ulcer left heel ulcer fat layer  exposed -Debridement as below. -Dressed with Betadine wet-to-dry, DSD. -Continue off-loading with surgical shoe.  Procedure: Excisional Debridement of Wound Tool: Sharp chisel blade/tissue nipper Rationale: Removal of non-viable soft tissue from the wound to promote healing.  Anesthesia: none Pre-Debridement Wound Measurements: 2 cm x 1.5 cm x 0.3 cm  Post-Debridement Wound Measurements: 2.5 cm x 1.7 cm x 0.3 cm  Type of Debridement: Sharp Excisional Tissue Removed: Non-viable soft tissue Blood loss: Minimal (<50cc) Depth of Debridement: subcutaneous tissue. Technique: Sharp excisional debridement to bleeding, viable wound base.  Wound Progress: This my initial evaluation continue to monitor the progression of the wound Site healing conversation 7 Dressing: Dry, sterile, compression dressing. Disposition: Patient tolerated procedure well. Patient to return in 1 week for follow-up.  No follow-ups on file.

## 2023-10-09 ENCOUNTER — Encounter: Payer: Self-pay | Admitting: Podiatry

## 2023-10-09 ENCOUNTER — Ambulatory Visit: Payer: Medicare Other | Admitting: Podiatry

## 2023-10-09 DIAGNOSIS — L97522 Non-pressure chronic ulcer of other part of left foot with fat layer exposed: Secondary | ICD-10-CM | POA: Diagnosis not present

## 2023-10-09 DIAGNOSIS — L97409 Non-pressure chronic ulcer of unspecified heel and midfoot with unspecified severity: Secondary | ICD-10-CM

## 2023-10-09 DIAGNOSIS — E11621 Type 2 diabetes mellitus with foot ulcer: Secondary | ICD-10-CM

## 2023-10-09 NOTE — Progress Notes (Signed)
 Subjective:  Patient ID: Casey Liu, female    DOB: [birth year],  MRN: [id number]  Chief Complaint  Patient presents with   Hammer Toe    Rm 10: 3 week follow up. Pt accompanied by daughter who states she has pain bilateral heels and they have been placing the iodine and covering as ordered and it does not seem to be helping. She also seems to be getting another skin break down area on the right heel. Daughter wants to know if you can prescribe an ointment for pain. No signs of infection noted to either heel.     89 y.o. female presents for wound care.  Denies any other acute complaints.  Review of Systems: Negative except as noted in the HPI. Denies N/V/F/Ch.  Past Medical History:  Diagnosis Date   Arthritis    Cataract    right   Diabetes mellitus without complication (HCC)    Family history of anesthesia complication    mother had "three heart attacks after anesthesia"   Headache(784.0)    hx of   History of stomach ulcers    Hypertension    Dr. Mikeal Hawthorne [phone]   Kidney problem    seeing nephrology   Pneumonia    hx of    Seasonal allergies    Sleep apnea    cpap   Stroke [facility])    left side weakness   Urinary tract infection    hx of    Current Outpatient Medications:    acetaminophen (TYLENOL) 325 MG tablet, Take 650 mg by mouth every 6 (six) hours as needed for mild pain or headache., Disp: , Rfl:    aspirin EC 81 MG tablet, Take 81 mg by mouth daily., Disp: , Rfl:    atenolol (TENORMIN) 100 MG tablet, Take 1 tablet (100 mg total) by mouth every evening. (Patient not taking: Reported on 08/19/2023), Disp: 90 tablet, Rfl: 1   carbidopa-levodopa (SINEMET IR) 25-100 MG tablet, TAKE 1 TABLET BY MOUTH THREE TIMES A DAY, Disp: 270 tablet, Rfl: 1   COMBIGAN 0.2-0.5 % ophthalmic solution, Place 1 drop into both eyes daily., Disp: , Rfl:    donepezil (ARICEPT) 5 MG tablet, Take 5 mg by mouth at bedtime. (Patient not taking: Reported on 08/19/2023), Disp:  , Rfl:    gabapentin (NEURONTIN) 300 MG capsule, Take 300 mg by mouth 3 (three) times daily., Disp: , Rfl:    LUMIGAN 0.01 % SOLN, Place 1 drop into both eyes at bedtime., Disp: , Rfl:    spironolactone (ALDACTONE) 25 MG tablet, Take 25 mg by mouth daily. (Patient not taking: Reported on 08/19/2023), Disp: , Rfl:    traZODone (DESYREL) 50 MG tablet, Take 50 mg by mouth at bedtime. (Patient not taking: Reported on 08/19/2023), Disp: , Rfl:   Social History   Tobacco Use  Smoking Status Never  Smokeless Tobacco Never    Allergies  Allergen Reactions   Celebrex [Celecoxib] Nausea And Vomiting and Palpitations   Latex Rash   Sinemet [Carbidopa-Levodopa] Anxiety   Objective:  There were no vitals filed for this visit. There is no height or weight on file to calculate BMI. Constitutional Well developed. Well nourished.  Vascular Dorsalis pedis pulses palpable bilaterally. Posterior tibial pulses palpable bilaterally. Capillary refill normal to all digits.  No cyanosis or clubbing noted. Pedal hair growth normal.  Neurologic Normal speech. Oriented to person, place, and time. Protective sensation absent  Dermatologic Wound Location: Left heel ulcer fat layer exposed Wound Base: Mixed  Granular/Fibrotic Peri-wound: Calloused Exudate: Scant/small amount Serosanguinous exudate Wound Measurements: -See below  Orthopedic: No pain to palpation either foot.   Radiographs: None Assessment:   1. 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 Type 2 diabetes, controlled, with ulcer of heel (HCC)     Plan:  Patient was evaluated and treated and all questions answered.  Hammertoe bilateral -All questions and concerns were discussed with the patient extensive detail given the present hammertoe contracture in setting of ulceration and history of ulceration patient will benefit from diabetic shoes.  She will be scheduled to see Trish for diabetic shoes  Ulcer left heel ulcer  fat layer exposed -Debridement as below. -Dressed with Betadine wet-to-dry, DSD. -Continue off-loading with surgical shoe. -Patient has palpable pulses therefore no ABIs is warranted at this time. -I will work on graft application approval.    Procedure: Excisional Debridement of Wound Tool: Sharp chisel blade/tissue nipper Rationale: Removal of non-viable soft tissue from the wound to promote healing.  Anesthesia: none Pre-Debridement Wound Measurements: 2 cm x 1.5 cm x 0.3 cm  Post-Debridement Wound Measurements: 2 cm x 1.7 cm x 0.3 cm  Type of Debridement: Sharp Excisional Tissue Removed: Non-viable soft tissue Blood loss: Minimal (<50cc) Depth of Debridement: subcutaneous tissue. Technique: Sharp excisional debridement to bleeding, viable wound base.  Wound Progress: The wound appears to be stagnant.  Patient presents with left heel ulceration for life so she states she is offloading today.  Doing Betadine wet-to-dry. Site healing conversation 7 Dressing: Dry, sterile, compression dressing. Disposition: Patient tolerated procedure well. Patient to return in 1 week for follow-up.  No follow-ups on file.

## 2023-10-16 ENCOUNTER — Ambulatory Visit: Admitting: Podiatry

## 2023-10-16 DIAGNOSIS — L97522 Non-pressure chronic ulcer of other part of left foot with fat layer exposed: Secondary | ICD-10-CM | POA: Diagnosis not present

## 2023-10-16 NOTE — Progress Notes (Signed)
 Application of Helicoll Graft: -arterial studies and/or Vascular intervention: -Patient has severe morbidity: Dementia.  Pressure ulcer -Patient was also nutritionally optimized and encouraged protein rich diet -Smoking cessation was discussed in extensive detail.  -Wound start date: August 11, 2023 -Interventions attempted: Betadine wet to dry/local wound care, offloading, immobilization  -Decision was made to use bovine graft since partial / full thickness skin grafts such as Theraskin can lead to scarring and wound recurrence. The clinic did not use any synthetic grafts due to higher chance of foreign body reactions /rejection. Cryo-preserved amniotic graft was not used since storage infrastructure is not available at this outpatient clinic and the patient is unwilling to go to a wound care center. Grafts requiring upfront purchase were not considered due to financial limitations of this outpatient clinic and thus only grafts available on consignment were considered. Bovine graft was chosen based on various characteristics including but not limited to scaffolding properties, heavy chain hyaluronic acid, cytokines, and grown factors to expedite wound healing.  Graft application: # -The wound was cleansed with saline. Selective debridement techniques to remove devitalized tissue was performed through subcutaneous tissue with 15 blade until viable bleeding tissue was encountered. -Topical application of Bovine Helicoll graft was placed over the wound and dressed with adaptic non-adherent, gauze, kling and ACE bandage. -Tissue ID number: [id number] -Expiration: 10/20/2018 10/20/2027 -Donor ID number: [id number]  Procedure: Excisional Debridement of Wound Tool: Sharp chisel blade/tissue nipper Rationale: Removal of non-viable soft tissue from the wound to promote healing.  Anesthesia: none Pre-Debridement Wound Measurements: 2 cm cm x 1.5 cm x 0.3 cm  Post-Debridement Wound Measurements: 2 cm x  1.5 cm x 0.3 cm  Type of Debridement: Sharp Excisional Tissue Removed: Non-viable soft tissue Blood loss: Minimal (<50cc) Depth of Debridement: subcutaneous tissue. Technique: Sharp excisional debridement to bleeding, viable wound base.  Wound Progress: This my initial evaluation of continue to monitor the progression of the wound Dressing: Dry, sterile, compression dressing. Disposition: Patient tolerated procedure well. Patient to return in 1 week for follow-up.    -The entire graft was utilized and non discarded. Graft was used in wound described above. I folded the graft in order to maximize amount of product used for the wound and maximize benefit from scaffold properties of the amniotic graft.

## 2023-10-23 ENCOUNTER — Ambulatory Visit: Admitting: Podiatry

## 2023-10-23 ENCOUNTER — Encounter: Payer: Self-pay | Admitting: Podiatry

## 2023-10-23 DIAGNOSIS — L97409 Non-pressure chronic ulcer of unspecified heel and midfoot with unspecified severity: Secondary | ICD-10-CM

## 2023-10-23 DIAGNOSIS — E11621 Type 2 diabetes mellitus with foot ulcer: Secondary | ICD-10-CM

## 2023-10-23 DIAGNOSIS — L97522 Non-pressure chronic ulcer of other part of left foot with fat layer exposed: Secondary | ICD-10-CM | POA: Diagnosis not present

## 2023-10-23 NOTE — Progress Notes (Signed)
 Application of Helicoll Graft: -arterial studies and/or Vascular intervention: Palpable pulses bilateral  -Patient has severe morbidity: Dementia.  Pressure ulcer -Patient was also nutritionally optimized and encouraged protein rich diet -Smoking cessation was discussed in extensive detail.  -Wound start date: August 11, 2023 -Interventions attempted: Betadine wet to dry/local wound care, offloading, immobilization  -Decision was made to use bovine graft since partial / full thickness skin grafts such as Theraskin can lead to scarring and wound recurrence. The clinic did not use any synthetic grafts due to higher chance of foreign body reactions /rejection. Cryo-preserved amniotic graft was not used since storage infrastructure is not available at this outpatient clinic and the patient is unwilling to go to a wound care center. Grafts requiring upfront purchase were not considered due to financial limitations of this outpatient clinic and thus only grafts available on consignment were considered. Bovine graft was chosen based on various characteristics including but not limited to scaffolding properties, heavy chain hyaluronic acid, cytokines, and grown factors to expedite wound healing.  Graft application: #2 -The wound was cleansed with saline. Selective debridement techniques to remove devitalized tissue was performed through subcutaneous tissue with 15 blade until viable bleeding tissue was encountered. -Topical application of Bovine Helicoll graft was placed over the wound and dressed with adaptic non-adherent, gauze, kling and ACE bandage. -Tissue ID number: [id number] -Expiration: 10/21/2027 -Donor ID number: [id number]  Procedure: Excisional Debridement of Wound Tool: Sharp chisel blade/tissue nipper Rationale: Removal of non-viable soft tissue from the wound to promote healing.  Anesthesia: none Pre-Debridement Wound Measurements: 1.8 cm cm x 1.5 cm x 0.3 cm  Post-Debridement Wound  Measurements: 1.8 cm x 1.5 cm x 0.3 cm  Type of Debridement: Sharp Excisional Tissue Removed: Non-viable soft tissue Blood loss: Minimal (<50cc) Depth of Debridement: subcutaneous tissue. Technique: Sharp excisional debridement to bleeding, viable wound base.  Wound Progress: Wound is decreasing with more granulation tissue Dressing: Dry, sterile, compression dressing. Disposition: Patient tolerated procedure well. Patient to return in 1 week for follow-up.    -The entire graft was utilized and non discarded. Graft was used in wound described above. I folded the graft in order to maximize amount of product used for the wound and maximize benefit from scaffold properties of the amniotic graft.

## 2023-10-26 ENCOUNTER — Other Ambulatory Visit: Payer: Medicare Other

## 2023-10-30 ENCOUNTER — Ambulatory Visit: Admitting: Podiatry

## 2023-10-30 DIAGNOSIS — L97522 Non-pressure chronic ulcer of other part of left foot with fat layer exposed: Secondary | ICD-10-CM

## 2023-10-30 NOTE — Progress Notes (Signed)
 Application of Helicoll Graft: -arterial studies and/or Vascular intervention: Palpable pulses bilateral  -Patient has severe morbidity: Dementia.  Pressure ulcer -Patient was also nutritionally optimized and encouraged protein rich diet -Smoking cessation was discussed in extensive detail.  -Wound start date: August 11, 2023 -Interventions attempted: Betadine wet to dry/local wound care, offloading, immobilization  -Decision was made to use bovine graft since partial / full thickness skin grafts such as Theraskin can lead to scarring and wound recurrence. The clinic did not use any synthetic grafts due to higher chance of foreign body reactions /rejection. Cryo-preserved amniotic graft was not used since storage infrastructure is not available at this outpatient clinic and the patient is unwilling to go to a wound care center. Grafts requiring upfront purchase were not considered due to financial limitations of this outpatient clinic and thus only grafts available on consignment were considered. Bovine graft was chosen based on various characteristics including but not limited to scaffolding properties, heavy chain hyaluronic acid, cytokines, and grown factors to expedite wound healing.  Graft application: #3 -The wound was cleansed with saline. Selective debridement techniques to remove devitalized tissue was performed through subcutaneous tissue with 15 blade until viable bleeding tissue was encountered. -Topical application of Bovine Helicoll graft was placed over the wound and dressed with adaptic non-adherent, gauze, kling and ACE bandage. -Tissue ID number: [id number] -Expiration: 10/22/2027 -Donor ID number: [id number]  Procedure: Excisional Debridement of Wound Tool: Sharp chisel blade/tissue nipper Rationale: Removal of non-viable soft tissue from the wound to promote healing.  Anesthesia: none Pre-Debridement Wound Measurements: 1.5 cm cm x 1.5 cm x 0.3 cm  Post-Debridement Wound  Measurements: 1.5cm x 1.5 cm x 0.3 cm  Type of Debridement: Sharp Excisional Tissue Removed: Non-viable soft tissue Blood loss: Minimal (<50cc) Depth of Debridement: subcutaneous tissue. Technique: Sharp excisional debridement to bleeding, viable wound base.  Wound Progress: Wound is decreasing with more granulation tissue Dressing: Dry, sterile, compression dressing. Disposition: Patient tolerated procedure well. Patient to return in 1 week for follow-up.    -The entire graft was utilized and non discarded. Graft was used in wound described above. I folded the graft in order to maximize amount of product used for the wound and maximize benefit from scaffold properties of the amniotic graft.

## 2023-11-05 ENCOUNTER — Ambulatory Visit (INDEPENDENT_AMBULATORY_CARE_PROVIDER_SITE_OTHER): Admitting: Podiatry

## 2023-11-05 ENCOUNTER — Encounter: Payer: Self-pay | Admitting: Podiatry

## 2023-11-05 DIAGNOSIS — K581 Irritable bowel syndrome with constipation: Secondary | ICD-10-CM | POA: Insufficient documentation

## 2023-11-05 DIAGNOSIS — L97522 Non-pressure chronic ulcer of other part of left foot with fat layer exposed: Secondary | ICD-10-CM

## 2023-11-05 DIAGNOSIS — R634 Abnormal weight loss: Secondary | ICD-10-CM | POA: Insufficient documentation

## 2023-11-05 DIAGNOSIS — K5904 Chronic idiopathic constipation: Secondary | ICD-10-CM | POA: Insufficient documentation

## 2023-11-05 DIAGNOSIS — R109 Unspecified abdominal pain: Secondary | ICD-10-CM | POA: Insufficient documentation

## 2023-11-05 DIAGNOSIS — R140 Abdominal distension (gaseous): Secondary | ICD-10-CM | POA: Insufficient documentation

## 2023-11-05 DIAGNOSIS — K219 Gastro-esophageal reflux disease without esophagitis: Secondary | ICD-10-CM | POA: Insufficient documentation

## 2023-11-05 DIAGNOSIS — K625 Hemorrhage of anus and rectum: Secondary | ICD-10-CM | POA: Insufficient documentation

## 2023-11-05 DIAGNOSIS — R1033 Periumbilical pain: Secondary | ICD-10-CM | POA: Insufficient documentation

## 2023-11-05 DIAGNOSIS — E669 Obesity, unspecified: Secondary | ICD-10-CM | POA: Insufficient documentation

## 2023-11-05 NOTE — Progress Notes (Signed)
 Application of Helicoll Graft: -arterial studies and/or Vascular intervention: Palpable pulses bilateral  -Patient has severe morbidity: Dementia.  Pressure ulcer -Patient was also nutritionally optimized and encouraged protein rich diet -Smoking cessation was discussed in extensive detail.  -Wound start date: August 11, 2023 -Interventions attempted: Betadine wet to dry/local wound care, offloading, immobilization  -Decision was made to use bovine graft since partial / full thickness skin grafts such as Theraskin can lead to scarring and wound recurrence. The clinic did not use any synthetic grafts due to higher chance of foreign body reactions /rejection. Cryo-preserved amniotic graft was not used since storage infrastructure is not available at this outpatient clinic and the patient is unwilling to go to a wound care center. Grafts requiring upfront purchase were not considered due to financial limitations of this outpatient clinic and thus only grafts available on consignment were considered. Bovine graft was chosen based on various characteristics including but not limited to scaffolding properties, heavy chain hyaluronic acid, cytokines, and grown factors to expedite wound healing.  Graft application: #4 -The wound was cleansed with saline. Selective debridement techniques to remove devitalized tissue was performed through subcutaneous tissue with 15 blade until viable bleeding tissue was encountered. -Topical application of Bovine Helicoll graft was placed over the wound and dressed with adaptic non-adherent, gauze, kling and ACE bandage. -Tissue ID number: [id number] -Expiration: 10/22/2027 -Donor ID number: [id number]  Procedure: Excisional Debridement of Wound Tool: Sharp chisel blade/tissue nipper Rationale: Removal of non-viable soft tissue from the wound to promote healing.  Anesthesia: none Pre-Debridement Wound Measurements: 1.3 cm cm x 1.3 cm x 0.3 cm  Post-Debridement Wound  Measurements: 1.3cm x 1.3 cm x 0.3 cm  Type of Debridement: Sharp Excisional Tissue Removed: Non-viable soft tissue Blood loss: Minimal (<50cc) Depth of Debridement: subcutaneous tissue. Technique: Sharp excisional debridement to bleeding, viable wound base.  Wound Progress: Wound is decreasing slowlu with more granulation tissue Dressing: Dry, sterile, compression dressing. Disposition: Patient tolerated procedure well. Patient to return in 1 week for follow-up.   -The entire graft was utilized and non discarded. Graft was used in wound described above. I folded the graft in order to maximize amount of product used for the wound and maximize benefit from scaffold properties of the amniotic graft.

## 2023-11-13 ENCOUNTER — Ambulatory Visit (INDEPENDENT_AMBULATORY_CARE_PROVIDER_SITE_OTHER): Admitting: Podiatry

## 2023-11-13 DIAGNOSIS — L97522 Non-pressure chronic ulcer of other part of left foot with fat layer exposed: Secondary | ICD-10-CM | POA: Diagnosis not present

## 2023-11-13 NOTE — Progress Notes (Signed)
 Application of Helicoll Graft: -arterial studies and/or Vascular intervention: Palpable pulses bilateral  -Patient has severe morbidity: Dementia.  Pressure ulcer -Patient was also nutritionally optimized and encouraged protein rich diet -Smoking cessation was discussed in extensive detail.  -Wound start date: August 11, 2023 -Interventions attempted: Betadine wet to dry/local wound care, offloading, immobilization  -Decision was made to use bovine graft since partial / full thickness skin grafts such as Theraskin can lead to scarring and wound recurrence. The clinic did not use any synthetic grafts due to higher chance of foreign body reactions /rejection. Cryo-preserved amniotic graft was not used since storage infrastructure is not available at this outpatient clinic and the patient is unwilling to go to a wound care center. Grafts requiring upfront purchase were not considered due to financial limitations of this outpatient clinic and thus only grafts available on consignment were considered. Bovine graft was chosen based on various characteristics including but not limited to scaffolding properties, heavy chain hyaluronic acid, cytokines, and grown factors to expedite wound healing.  Graft application: #5 -The wound was cleansed with saline. Selective debridement techniques to remove devitalized tissue was performed through subcutaneous tissue with 15 blade until viable bleeding tissue was encountered. -Topical application of Bovine Helicoll graft was placed over the wound and dressed with adaptic non-adherent, gauze, kling and ACE bandage. -Tissue ID number: [id number] -Expiration: 10/29/2027 -Donor ID number: [id number]  Procedure: Excisional Debridement of Wound Tool: Sharp chisel blade/tissue nipper Rationale: Removal of non-viable soft tissue from the wound to promote healing.  Anesthesia: none Pre-Debridement Wound Measurements: 1.1 cm cm x 1.1 cm x 0.3 cm  Post-Debridement Wound  Measurements: 1.1cm x 1.1 cm x 0.3 cm  Type of Debridement: Sharp Excisional Tissue Removed: Non-viable soft tissue Blood loss: Minimal (<50cc) Depth of Debridement: subcutaneous tissue. Technique: Sharp excisional debridement to bleeding, viable wound base.  Wound Progress: Wound is decreasing slowlu with more granulation tissue Dressing: Dry, sterile, compression dressing. Disposition: Patient tolerated procedure well. Patient to return in 1 week for follow-up.   -The entire graft was utilized and non discarded. Graft was used in wound described above. I folded the graft in order to maximize amount of product used for the wound and maximize benefit from scaffold properties of the amniotic graft.

## 2023-11-20 ENCOUNTER — Ambulatory Visit (INDEPENDENT_AMBULATORY_CARE_PROVIDER_SITE_OTHER): Admitting: Podiatry

## 2023-11-20 ENCOUNTER — Encounter: Payer: Self-pay | Admitting: Podiatry

## 2023-11-20 DIAGNOSIS — L97522 Non-pressure chronic ulcer of other part of left foot with fat layer exposed: Secondary | ICD-10-CM

## 2023-11-20 NOTE — Progress Notes (Signed)
 Application of Helicoll Graft: -arterial studies and/or Vascular intervention: Palpable pulses bilateral  -Patient has severe morbidity: Dementia.  Pressure ulcer -Patient was also nutritionally optimized and encouraged protein rich diet -Smoking cessation was discussed in extensive detail.  -Wound start date: August 11, 2023 -Interventions attempted: Betadine wet to dry/local wound care, offloading, immobilization  -Decision was made to use bovine graft since partial / full thickness skin grafts such as Theraskin can lead to scarring and wound recurrence. The clinic did not use any synthetic grafts due to higher chance of foreign body reactions /rejection. Cryo-preserved amniotic graft was not used since storage infrastructure is not available at this outpatient clinic and the patient is unwilling to go to a wound care center. Grafts requiring upfront purchase were not considered due to financial limitations of this outpatient clinic and thus only grafts available on consignment were considered. Bovine graft was chosen based on various characteristics including but not limited to scaffolding properties, heavy chain hyaluronic acid, cytokines, and grown factors to expedite wound healing.  Graft application: #6 -The wound was cleansed with saline. Selective debridement techniques to remove devitalized tissue was performed through subcutaneous tissue with 15 blade until viable bleeding tissue was encountered. -Topical application of Bovine Helicoll graft was placed over the wound and dressed with adaptic non-adherent, gauze, kling and ACE bandage. -Tissue ID number: [id number] -Expiration: 10/13/2027 -Donor ID number: [id number]  Procedure: Excisional Debridement of Wound Tool: Sharp chisel blade/tissue nipper Rationale: Removal of non-viable soft tissue from the wound to promote healing.  Anesthesia: none Pre-Debridement Wound Measurements: 1.0 cm cm x 1.1 cm x 0.3 cm  Post-Debridement Wound  Measurements: 1.0cm x 1.1 cm x 0.3 cm  Type of Debridement: Sharp Excisional Tissue Removed: Non-viable soft tissue Blood loss: Minimal (<50cc) Depth of Debridement: subcutaneous tissue. Technique: Sharp excisional debridement to bleeding, viable wound base.  Wound Progress: Wound is decreasing slowlu with more granulation tissue Dressing: Dry, sterile, compression dressing. Disposition: Patient tolerated procedure well. Patient to return in 1 week for follow-up.   -The entire graft was utilized and non discarded. Graft was used in wound described above. I folded the graft in order to maximize amount of product used for the wound and maximize benefit from scaffold properties of the amniotic graft.

## 2023-11-27 ENCOUNTER — Ambulatory Visit (INDEPENDENT_AMBULATORY_CARE_PROVIDER_SITE_OTHER): Admitting: Podiatry

## 2023-11-27 DIAGNOSIS — L97522 Non-pressure chronic ulcer of other part of left foot with fat layer exposed: Secondary | ICD-10-CM | POA: Diagnosis not present

## 2023-11-27 DIAGNOSIS — L97409 Non-pressure chronic ulcer of unspecified heel and midfoot with unspecified severity: Secondary | ICD-10-CM

## 2023-11-27 DIAGNOSIS — E11621 Type 2 diabetes mellitus with foot ulcer: Secondary | ICD-10-CM

## 2023-11-27 NOTE — Progress Notes (Signed)
 Application of Helicoll Graft: -arterial studies and/or Vascular intervention: Palpable pulses bilateral  -Patient has severe morbidity: Dementia.  Pressure ulcer -Patient was also nutritionally optimized and encouraged protein rich diet -Smoking cessation was discussed in extensive detail.  -Wound start date: August 11, 2023 -Interventions attempted: Betadine wet to dry/local wound care, offloading, immobilization  -Decision was made to use bovine graft since partial / full thickness skin grafts such as Theraskin can lead to scarring and wound recurrence. The clinic did not use any synthetic grafts due to higher chance of foreign body reactions /rejection. Cryo-preserved amniotic graft was not used since storage infrastructure is not available at this outpatient clinic and the patient is unwilling to go to a wound care center. Grafts requiring upfront purchase were not considered due to financial limitations of this outpatient clinic and thus only grafts available on consignment were considered. Bovine graft was chosen based on various characteristics including but not limited to scaffolding properties, heavy chain hyaluronic acid, cytokines, and grown factors to expedite wound healing.  Graft application: #7 -The wound was cleansed with saline. Selective debridement techniques to remove devitalized tissue was performed through subcutaneous tissue with 15 blade until viable bleeding tissue was encountered. -Topical application of Bovine Helicoll graft was placed over the wound and dressed with adaptic non-adherent, gauze, kling and ACE bandage. -Tissue ID number: [id number] -Expiration: 11/03/2027 -Donor ID number: [id number]    Procedure: Excisional Debridement of Wound Tool: Sharp chisel blade/tissue nipper Rationale: Removal of non-viable soft tissue from the wound to promote healing.  Anesthesia: none Pre-Debridement Wound Measurements: 0.7cm cm x 0.7 cm x 0.3 cm  Post-Debridement  Wound Measurements: 0.7 m x 0.7 cm x 0.3 cm  Type of Debridement: Sharp Excisional Tissue Removed: Non-viable soft tissue Blood loss: Minimal (<50cc) Depth of Debridement: subcutaneous tissue. Technique: Sharp excisional debridement to bleeding, viable wound base.  Wound Progress: Wound is decreasing slowly with more granulation tissue Dressing: Dry, sterile, compression dressing. Disposition: Patient tolerated procedure well. Patient to return in 1 week for follow-up.   -The entire graft was utilized and non discarded. Graft was used in wound described above. I folded the graft in order to maximize amount of product used for the wound and maximize benefit from scaffold properties of the amniotic graft.

## 2023-12-04 ENCOUNTER — Ambulatory Visit: Admitting: Podiatry

## 2023-12-04 VITALS — Ht 66.0 in | Wt 141.0 lb

## 2023-12-04 DIAGNOSIS — L97522 Non-pressure chronic ulcer of other part of left foot with fat layer exposed: Secondary | ICD-10-CM | POA: Diagnosis not present

## 2023-12-04 DIAGNOSIS — E11621 Type 2 diabetes mellitus with foot ulcer: Secondary | ICD-10-CM

## 2023-12-04 DIAGNOSIS — L97409 Non-pressure chronic ulcer of unspecified heel and midfoot with unspecified severity: Secondary | ICD-10-CM

## 2023-12-04 NOTE — Progress Notes (Signed)
 Application of MLG Complete: -arterial studies and/or Vascular intervention: Palpable pulses bilateral  -Patient has severe morbidity: Dementia.  Pressure ulcer -Patient was also nutritionally optimized and encouraged protein rich diet -Smoking cessation was discussed in extensive detail.  -Wound start date: August 11, 2023 -Interventions attempted: Betadine wet to dry/local wound care, offloading, immobilization  -Decision was made to use bovine graft since partial / full thickness skin grafts such as Theraskin can lead to scarring and wound recurrence. The clinic did not use any synthetic grafts due to higher chance of foreign body reactions /rejection. Cryo-preserved amniotic graft was not used since storage infrastructure is not available at this outpatient clinic and the patient is unwilling to go to a wound care center. Grafts requiring upfront purchase were not considered due to financial limitations of this outpatient clinic and thus only grafts available on consignment were considered. Bovine graft was chosen based on various characteristics including but not limited to scaffolding properties, heavy chain hyaluronic acid, cytokines, and grown factors to expedite wound healing.  Graft application: #8 -The wound was cleansed with saline. Selective debridement techniques to remove devitalized tissue was performed through subcutaneous tissue with 15 blade until viable bleeding tissue was encountered. -Topical application of Bovine Helicoll graft was placed over the wound and dressed with adaptic non-adherent, gauze, kling and ACE bandage. -Tissue ID number: [id number] -Expiration: 11/03/2027 -Donor ID number: [id number]    Procedure: Excisional Debridement of Wound Tool: Sharp chisel blade/tissue nipper Rationale: Removal of non-viable soft tissue from the wound to promote healing.  Anesthesia: none Pre-Debridement Wound Measurements: 0.5cm cm x 0.5 cm x 0.3 cm  Post-Debridement  Wound Measurements: 0.5cm x 0.5 cm x 0.3 cm  Type of Debridement: Sharp Excisional Tissue Removed: Non-viable soft tissue Blood loss: Minimal (<50cc) Depth of Debridement: subcutaneous tissue. Technique: Sharp excisional debridement to bleeding, viable wound base.  Wound Progress: Wound is decreasing slowly with more granulation tissue Dressing: Dry, sterile, compression dressing. Disposition: Patient tolerated procedure well. Patient to return in 1 week for follow-up.   -The entire graft was utilized and non discarded. Graft was used in wound described above. I folded the graft in order to maximize amount of product used for the wound and maximize benefit from scaffold properties of the amniotic graft.

## 2023-12-09 ENCOUNTER — Ambulatory Visit (INDEPENDENT_AMBULATORY_CARE_PROVIDER_SITE_OTHER): Admitting: Podiatry

## 2023-12-09 DIAGNOSIS — L97522 Non-pressure chronic ulcer of other part of left foot with fat layer exposed: Secondary | ICD-10-CM | POA: Diagnosis not present

## 2023-12-09 NOTE — Progress Notes (Signed)
 Application of MLG Complete: -arterial studies and/or Vascular intervention: Palpable pulses bilateral  -Patient has severe morbidity: Dementia.  Pressure ulcer -Patient was also nutritionally optimized and encouraged protein rich diet -Smoking cessation was discussed in extensive detail.  -Wound start date: August 11, 2023 -Interventions attempted: Betadine wet to dry/local wound care, offloading, immobilization  -Decision was made to use bovine graft since partial / full thickness skin grafts such as Theraskin can lead to scarring and wound recurrence. The clinic did not use any synthetic grafts due to higher chance of foreign body reactions /rejection. Cryo-preserved amniotic graft was not used since storage infrastructure is not available at this outpatient clinic and the patient is unwilling to go to a wound care center. Grafts requiring upfront purchase were not considered due to financial limitations of this outpatient clinic and thus only grafts available on consignment were considered. Bovine graft was chosen based on various characteristics including but not limited to scaffolding properties, heavy chain hyaluronic acid, cytokines, and grown factors to expedite wound healing.  Graft application: #9 -The wound was cleansed with saline. Selective debridement techniques to remove devitalized tissue was performed through subcutaneous tissue with 15 blade until viable bleeding tissue was encountered. -Topical application of Bovine Helicoll graft was placed over the wound and dressed with adaptic non-adherent, gauze, kling and ACE bandage. -Tissue ID number: [id number] -Expiration: 11/04/2027 -Donor ID number: [id number]    Procedure: Excisional Debridement of Wound Tool: Sharp chisel blade/tissue nipper Rationale: Removal of non-viable soft tissue from the wound to promote healing.  Anesthesia: none Pre-Debridement Wound Measurements: 0.4cm cm x 0.4 cm x 0.3 cm  Post-Debridement  Wound Measurements: 0.4cm x 0.4 cm x 0.3 cm  Type of Debridement: Sharp Excisional Tissue Removed: Non-viable soft tissue Blood loss: Minimal (<50cc) Depth of Debridement: subcutaneous tissue. Technique: Sharp excisional debridement to bleeding, viable wound base.  Wound Progress: Wound is decreasing slowly with more granulation tissue Dressing: Dry, sterile, compression dressing. Disposition: Patient tolerated procedure well. Patient to return in 1 week for follow-up.   -The entire graft was utilized and non discarded. Graft was used in wound described above. I folded the graft in order to maximize amount of product used for the wound and maximize benefit from scaffold properties of the amniotic graft.

## 2023-12-10 ENCOUNTER — Other Ambulatory Visit

## 2023-12-25 ENCOUNTER — Ambulatory Visit: Admitting: Podiatry

## 2023-12-25 DIAGNOSIS — L97409 Non-pressure chronic ulcer of unspecified heel and midfoot with unspecified severity: Secondary | ICD-10-CM

## 2023-12-25 DIAGNOSIS — L97522 Non-pressure chronic ulcer of other part of left foot with fat layer exposed: Secondary | ICD-10-CM | POA: Diagnosis not present

## 2023-12-25 DIAGNOSIS — E11621 Type 2 diabetes mellitus with foot ulcer: Secondary | ICD-10-CM | POA: Diagnosis not present

## 2023-12-25 NOTE — Progress Notes (Signed)
 Subjective:  Patient ID: Casey Liu, female    DOB: 11/05/1934,  MRN: [id number]  Chief Complaint  Patient presents with   Foot Ulcer    89 y.o. female presents for wound care.  Denies any other acute complaints.  Review of Systems: Negative except as noted in the HPI. Denies N/V/F/Ch.  Past Medical History:  Diagnosis Date   Arthritis    Cataract    right   Diabetes mellitus without complication (HCC)    Family history of anesthesia complication    mother had "three heart attacks after anesthesia"   Headache(784.0)    hx of   History of stomach ulcers    Hypertension    Dr. Carlton Chick [phone]   Kidney problem    seeing nephrology   Pneumonia    hx of    Seasonal allergies    Sleep apnea    cpap   Stroke [facility])    left side weakness   Urinary tract infection    hx of    Current Outpatient Medications:    acetaminophen  (TYLENOL ) 325 MG tablet, Take 650 mg by mouth every 6 (six) hours as needed for mild pain or headache., Disp: , Rfl:    aspirin  EC 81 MG tablet, Take 81 mg by mouth daily., Disp: , Rfl:    atenolol  (TENORMIN ) 100 MG tablet, Take 1 tablet (100 mg total) by mouth every evening., Disp: 90 tablet, Rfl: 1   carbidopa -levodopa  (SINEMET  IR) 25-100 MG tablet, TAKE 1 TABLET BY MOUTH THREE TIMES A DAY, Disp: 270 tablet, Rfl: 1   COMBIGAN  0.2-0.5 % ophthalmic solution, Place 1 drop into both eyes daily., Disp: , Rfl:    donepezil (ARICEPT) 5 MG tablet, Take 5 mg by mouth at bedtime., Disp: , Rfl:    gabapentin (NEURONTIN) 300 MG capsule, Take 300 mg by mouth 3 (three) times daily., Disp: , Rfl:    LUMIGAN 0.01 % SOLN, Place 1 drop into both eyes at bedtime., Disp: , Rfl:    spironolactone  (ALDACTONE ) 25 MG tablet, Take 25 mg by mouth daily., Disp: , Rfl:    traZODone (DESYREL) 50 MG tablet, Take 50 mg by mouth at bedtime., Disp: , Rfl:   Social History   Tobacco Use  Smoking Status Never  Smokeless Tobacco Never    Allergies  Allergen  Reactions   Celebrex [Celecoxib] Nausea And Vomiting and Palpitations   Latex Rash   Sinemet  [Carbidopa -Levodopa ] Anxiety   Objective:  There were no vitals filed for this visit. There is no height or weight on file to calculate BMI. Constitutional Well developed. Well nourished.  Vascular Dorsalis pedis pulses palpable bilaterally. Posterior tibial pulses palpable bilaterally. Capillary refill normal to all digits.  No cyanosis or clubbing noted. Pedal hair growth normal.  Neurologic Normal speech. Oriented to person, place, and time. Protective sensation absent  Dermatologic Wound Location: Left heel ulcer fat layer exposed Wound Base: Mixed Granular/Fibrotic Peri-wound: Calloused Exudate: Scant/small amount Serosanguinous exudate Wound Measurements: -See below  Orthopedic: No pain to palpation either foot.   Radiographs: None Assessment:   1. 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 Type 2 diabetes, controlled, with ulcer of heel (HCC)     Plan:  Patient was evaluated and treated and all questions answered.  Hammertoe bilateral -All questions and concerns were discussed with the patient extensive detail given the present hammertoe contracture in setting of ulceration and history of ulceration patient will benefit from diabetic shoes.  She  will be scheduled to see Trish for diabetic shoes  Ulcer left heel ulcer fat layer exposed -Debridement as below. -Dressed with Betadine wet-to-dry, DSD. -Continue off-loading with surgical shoe. - Patient will benefit from wound care center.  Order for mupirocin was placed.  Patient has failed graft incorporation  Procedure: Excisional Debridement of Wound Tool: Sharp chisel blade/tissue nipper Rationale: Removal of non-viable soft tissue from the wound to promote healing.  Anesthesia: none Pre-Debridement Wound Measurements: 0.4cm cm x 0.4 cm x 0.3 cm  Post-Debridement Wound Measurements: 0.4cm cm x 0.4 cm x  0.3 cm  Type of Debridement: Sharp Excisional Tissue Removed: Non-viable soft tissue Blood loss: Minimal (<50cc) Depth of Debridement: subcutaneous tissue. Technique: Sharp excisional debridement to bleeding, viable wound base.  Wound Progress: The wound appears to be stagnant.  Graft did not incorporate as well. Dressing: Dry, sterile, compression dressing. Disposition: Patient tolerated procedure well. Patient to return in 1 week for follow-up.  No follow-ups on file.

## 2024-01-25 ENCOUNTER — Telehealth: Payer: Self-pay | Admitting: Neurology

## 2024-01-25 ENCOUNTER — Other Ambulatory Visit: Payer: Self-pay

## 2024-01-25 ENCOUNTER — Other Ambulatory Visit: Payer: Self-pay | Admitting: Podiatry

## 2024-01-25 DIAGNOSIS — Z789 Other specified health status: Secondary | ICD-10-CM

## 2024-01-25 DIAGNOSIS — G4733 Obstructive sleep apnea (adult) (pediatric): Secondary | ICD-10-CM

## 2024-01-25 MED ORDER — MUPIROCIN CALCIUM 2 % EX CREA: 1.0000 | TOPICAL_CREAM | Freq: Two times a day (BID) | CUTANEOUS | 0 refills | Status: AC

## 2024-01-25 NOTE — Telephone Encounter (Signed)
 She does need a the pressure for apnea control will change to autoPAP of 5-9 cm with EPR of 3 till next appt.

## 2024-01-25 NOTE — Telephone Encounter (Signed)
 Pt's daughter reports the CPAP machine pressure is too high , it goes to 9 pt unable to use

## 2024-01-26 NOTE — Telephone Encounter (Signed)
 I called daughter, relayed the message and the recommended change.   Will see how she does and let us  know. Verbalized understanding. Made change in airview.  Sent message to [employer].

## 2024-01-26 NOTE — Telephone Encounter (Signed)
 RE: cpap to autopap  (made changes) Received: Today New, Adine Neysa Nena GORMAN, RN; Joylene Carlean Jackson Avelina; Sheree Leveda Dollar, Dolanda; 1 other Received, thank you!     Previous Messages    ----- Message ----- From: Neysa Nena GORMAN, RN Sent: 01/26/2024  10:15 AM EDT To: Adine Joylene; Avelina Jackson; Ephraim Dollar* Subject: cpap to autopap  (made changes)                New order in epic,  made change in airview.  Adelene D. Benitez-Bonilla Female, 89 y.o., 1934-12-22 MRN: [id number] Phone: [phone] Needs Interpreter: Spanish  Particia SAUNDERS

## 2024-01-29 ENCOUNTER — Ambulatory Visit (INDEPENDENT_AMBULATORY_CARE_PROVIDER_SITE_OTHER): Admitting: Podiatry

## 2024-01-29 DIAGNOSIS — L97522 Non-pressure chronic ulcer of other part of left foot with fat layer exposed: Secondary | ICD-10-CM | POA: Diagnosis not present

## 2024-01-29 MED ORDER — MUPIROCIN 2 % EX OINT: 1.0000 | TOPICAL_OINTMENT | Freq: Two times a day (BID) | CUTANEOUS | 0 refills | Status: AC

## 2024-01-29 NOTE — Progress Notes (Signed)
 Subjective:  Patient ID: Casey Liu, female    DOB: 17-Apr-1935,  MRN: [id number]  Chief Complaint  Patient presents with   Foot Ulcer    89 y.o. female presents for wound care.  Denies any other acute complaints.  Review of Systems: Negative except as noted in the HPI. Denies N/V/F/Ch.  Past Medical History:  Diagnosis Date   Arthritis    Cataract    right   Diabetes mellitus without complication (HCC)    Family history of anesthesia complication    mother had three heart attacks after anesthesia   Headache(784.0)    hx of   History of stomach ulcers    Hypertension    Dr. Sim [phone]   Kidney problem    seeing nephrology   Pneumonia    hx of    Seasonal allergies    Sleep apnea    cpap   Stroke [facility])    left side weakness   Urinary tract infection    hx of    Current Outpatient Medications:    mupirocin  ointment (BACTROBAN ) 2 %, Apply 1 Application topically 2 (two) times daily., Disp: 22 g, Rfl: 0   acetaminophen  (TYLENOL ) 325 MG tablet, Take 650 mg by mouth every 6 (six) hours as needed for mild pain or headache., Disp: , Rfl:    aspirin  EC 81 MG tablet, Take 81 mg by mouth daily., Disp: , Rfl:    atenolol  (TENORMIN ) 100 MG tablet, Take 1 tablet (100 mg total) by mouth every evening., Disp: 90 tablet, Rfl: 1   carbidopa -levodopa  (SINEMET  IR) 25-100 MG tablet, TAKE 1 TABLET BY MOUTH THREE TIMES A DAY, Disp: 270 tablet, Rfl: 1   COMBIGAN  0.2-0.5 % ophthalmic solution, Place 1 drop into both eyes daily., Disp: , Rfl:    donepezil (ARICEPT) 5 MG tablet, Take 5 mg by mouth at bedtime., Disp: , Rfl:    gabapentin (NEURONTIN) 300 MG capsule, Take 300 mg by mouth 3 (three) times daily., Disp: , Rfl:    LUMIGAN 0.01 % SOLN, Place 1 drop into both eyes at bedtime., Disp: , Rfl:    mupirocin  cream (BACTROBAN ) 2 %, Apply 1 Application topically 2 (two) times daily., Disp: 15 g, Rfl: 0   spironolactone  (ALDACTONE ) 25 MG tablet, Take 25 mg by mouth  daily., Disp: , Rfl:    traZODone (DESYREL) 50 MG tablet, Take 50 mg by mouth at bedtime., Disp: , Rfl:   Social History   Tobacco Use  Smoking Status Never  Smokeless Tobacco Never    Allergies  Allergen Reactions   Celebrex [Celecoxib] Nausea And Vomiting and Palpitations   Latex Rash   Sinemet  [Carbidopa -Levodopa ] Anxiety   Objective:  There were no vitals filed for this visit. There is no height or weight on file to calculate BMI. Constitutional Well developed. Well nourished.  Vascular Dorsalis pedis pulses palpable bilaterally. Posterior tibial pulses palpable bilaterally. Capillary refill normal to all digits.  No cyanosis or clubbing noted. Pedal hair growth normal.  Neurologic Normal speech. Oriented to person, place, and time. Protective sensation absent  Dermatologic Wound Location: Left heel ulcer fat layer exposed Wound Base: Mixed Granular/Fibrotic Peri-wound: Calloused Exudate: Scant/small amount Serosanguinous exudate Wound Measurements: -See below  Orthopedic: No pain to palpation either foot.   Radiographs: None Assessment:   No diagnosis found.   Plan:  Patient was evaluated and treated and all questions answered.  Hammertoe bilateral -All questions and concerns were discussed with the patient extensive detail given the  present hammertoe contracture in setting of ulceration and history of ulceration patient will benefit from diabetic shoes.  She will be scheduled to see Trish for diabetic shoes  Ulcer left heel ulcer fat layer exposed -Debridement as below. -Dressed with Betadine wet-to-dry, DSD. -Continue off-loading with surgical shoe. - Pain patient is scheduled to follow-up at the wound care center.  I will defer further management to them.  Procedure: Excisional Debridement of Wound Tool: Sharp chisel blade/tissue nipper Rationale: Removal of non-viable soft tissue from the wound to promote healing.  Anesthesia: none Pre-Debridement  Wound Measurements: 0.4cm cm x 0.4 cm x 0.3 cm  Post-Debridement Wound Measurements: 0.4cm cm x 0.4 cm x 0.3 cm  Type of Debridement: Sharp Excisional Tissue Removed: Non-viable soft tissue Blood loss: Minimal (<50cc) Depth of Debridement: subcutaneous tissue. Technique: Sharp excisional debridement to bleeding, viable wound base.  Wound Progress: The wound appears to be stagnant.  Graft did not incorporate as well. Dressing: Dry, sterile, compression dressing. Disposition: Patient tolerated procedure well. Patient to return in 1 week for follow-up.  No follow-ups on file.

## 2024-02-02 ENCOUNTER — Encounter: Attending: Physician Assistant | Admitting: Physician Assistant

## 2024-02-02 DIAGNOSIS — L97522 Non-pressure chronic ulcer of other part of left foot with fat layer exposed: Secondary | ICD-10-CM | POA: Diagnosis not present

## 2024-02-02 DIAGNOSIS — E11621 Type 2 diabetes mellitus with foot ulcer: Secondary | ICD-10-CM | POA: Diagnosis present

## 2024-02-02 DIAGNOSIS — I10 Essential (primary) hypertension: Secondary | ICD-10-CM | POA: Insufficient documentation

## 2024-02-02 DIAGNOSIS — G308 Other Alzheimer's disease: Secondary | ICD-10-CM | POA: Diagnosis not present

## 2024-02-16 ENCOUNTER — Encounter: Admitting: Physician Assistant

## 2024-02-16 DIAGNOSIS — E11621 Type 2 diabetes mellitus with foot ulcer: Secondary | ICD-10-CM | POA: Diagnosis not present

## 2024-02-28 NOTE — Progress Notes (Signed)
 PATIENT: Casey Liu DOB: 06-03-1935  REASON FOR VISIT: follow up HISTORY FROM: patient, daughter, interpretor  PRIMARY NEUROLOGIST: Dr. Buck  Chief Complaint  Patient presents with   Follow-up   Medication Management    Would like to know if there is a different medication that the patient can be put on due to irritation and aggression while on medication. To a point she wants to hit people, doesn't want to be mobile due to pain in bilateral feet, left over right      HISTORY OF PRESENT ILLNESS: Today 02/29/24:  Casey Liu is a 89 y.o. female with a history of Parkinson disease, obstructive sleep apnea on CPAP and memory disturbance. Returns today for follow-up.  She is here today with her daughter.  Daughter feels that the tremor has gotten slightly worse.  Tremor primarily in the right hand but has noticed it in the left now.  She no longer ambulates without assistance.  Daughter reports that she has a wound on the left foot and she has been going to the wound center.  She requires assistance with all ADLs.  Requires assistance with eating.  She does report that she does eat well.  No trouble chewing or swallowing food.  She does note that sometimes in the morning when she is trying to get her out of bed she can be combative but typically she can walk away and come back and she is more calm.  She remains on Sinemet  3 times a day and Aricept 5 mg daily.  Does report that primary care reduce gabapentin to twice a day but she is still sleepy throughout the day.   08/19/2023: She appears drowsy today and keeps her eyes closed for most of the visit, not verbal today, history is provided by her daughter.  Daughter reports that every day is a little different, sometimes she sleeps well at night and sometimes she is up until after midnight, sometimes he has some behavioral changes including not wanting to get out of bed and resisting help.  Appetite is  fairly good.  She continues to take gabapentin 300 mg 3 times daily, unclear why she takes it.  Daughter is not completely sure if she is still on donepezil 5 mg at bedtime.  She will check at home.     02/15/23 Casey Liu is a 89 y.o. female with a history of OSA on CPAP, Parksinsons, memory disturbance.. Returns today for follow-up.   OSA on CPAP: DL is below. Doesn't always keep her CPAP on. Falls asleep sometimes without putting it on. Her daughter will have to out it on her.  Parkinson's disease: remains on sinemet  1 tablet TID. Continues  with tremor in the right hand. Daughter states this started after a shoulder injury on the right. Some times needs assistance with ambulation. Uses a Rolator. Had a fall going to the bathroom- daughter couldn't hold her- she was lowered to the floor. No injuries.   Memory: daughter reports memory has worsened. Needs assistance with ADLs. No longer driving. Needs assistance with medications, appointments and finances.      Casey Liu is a 89 y.o. female who has been followed in this office for OSA on CPAP, Parksinsons, memory disturbance. Returns today for follow-up. Daughter reports that she takes the machine off. Daughter feels that memory is not great but no changes since the last visit. Lives with her daughter. Needs help with all ADLs.   Daughter reports right hand  shakes but this started after a fall when she didn't get her shoulder fixed. Daughter reports gait stable. No recent falls. Currently taking Sinemet  1 tab TID.   HISTORY 04/01/2022: She reports tingling and burning sensation in her right pinky finger.  Of note, she is on gabapentin 300 mg 3 times daily.  She is no longer on levodopa  therapy, still has issues with anxiety and agitation, has had visual hallucinations and memory loss.  She has not fallen recently, she uses a walker at the house.  She uses her AutoPap, I was able to review her compliance data  from 03/01/2022 through 03/30/2022, during which time she used her machine 29 days with percent use days greater than 4 hours at 67%, indicating slightly suboptimal compliance, average usage of 6 hours and 10 minutes, residual AHI elevated at 21.2/h with primarily central events with a central respiratory index of 6.6/h, average pressure for the 95th percentile at 9.6 cm with a range of 4 to 10 cm with EPR.  She has ongoing issues with forgetfulness, she has ongoing issues with tremors.    REVIEW OF SYSTEMS: Out of a complete 14 system review of symptoms, the patient complains only of the following symptoms, and all other reviewed systems are negative.  ESS 15 FSS 18   ALLERGIES: Allergies  Allergen Reactions   Celebrex [Celecoxib] Nausea And Vomiting and Palpitations   Latex Rash   Sinemet  [Carbidopa -Levodopa ] Anxiety    HOME MEDICATIONS: Outpatient Medications Prior to Visit  Medication Sig Dispense Refill   acetaminophen  (TYLENOL ) 325 MG tablet Take 650 mg by mouth every 6 (six) hours as needed for mild pain or headache.     aspirin  EC 81 MG tablet Take 81 mg by mouth daily.     atenolol  (TENORMIN ) 100 MG tablet Take 1 tablet (100 mg total) by mouth every evening. 90 tablet 1   carbidopa -levodopa  (SINEMET  IR) 25-100 MG tablet TAKE 1 TABLET BY MOUTH THREE TIMES A DAY 270 tablet 1   COMBIGAN  0.2-0.5 % ophthalmic solution Place 1 drop into both eyes daily.     donepezil (ARICEPT) 5 MG tablet Take 5 mg by mouth at bedtime.     gabapentin (NEURONTIN) 300 MG capsule Take 300 mg by mouth 3 (three) times daily.     LUMIGAN 0.01 % SOLN Place 1 drop into both eyes at bedtime.     mupirocin  cream (BACTROBAN ) 2 % Apply 1 Application topically 2 (two) times daily. 15 g 0   mupirocin  ointment (BACTROBAN ) 2 % Apply 1 Application topically 2 (two) times daily. 22 g 0   spironolactone  (ALDACTONE ) 25 MG tablet Take 25 mg by mouth daily.     traZODone (DESYREL) 50 MG tablet Take 50 mg by mouth at  bedtime.     No facility-administered medications prior to visit.    PAST MEDICAL HISTORY: Past Medical History:  Diagnosis Date   Arthritis    Cataract    right   Diabetes mellitus without complication (HCC)    Family history of anesthesia complication    mother had three heart attacks after anesthesia   Headache(784.0)    hx of   History of stomach ulcers    Hypertension    Dr. Sim [phone]   Kidney problem    seeing nephrology   Pneumonia    hx of    Seasonal allergies    Sleep apnea    cpap   Stroke [facility])    left side weakness   Urinary tract  infection    hx of    PAST SURGICAL HISTORY: Past Surgical History:  Procedure Laterality Date   ABDOMINAL HYSTERECTOMY     APPENDECTOMY     BLADDER SURGERY     CATARACT EXTRACTION W/PHACO  07/07/2012   Procedure: CATARACT EXTRACTION PHACO AND INTRAOCULAR LENS PLACEMENT (IOC);  Surgeon: Jestine Bunnell, MD;  Location: [facility] OR;  Service: Ophthalmology;  Laterality: Right;   CESAREAN SECTION     x 1   CHOLECYSTECTOMY     EYE SURGERY     seven eye surgery   KNEE SURGERY     left knee   OVARIAN CYST REMOVAL     TOTAL KNEE ARTHROPLASTY Right 11/12/2015   Procedure: TOTAL KNEE ARTHROPLASTY;  Surgeon: Dempsey Sensor, MD;  Location: [facility] OR;  Service: Orthopedics;  Laterality: Right;   TOTAL KNEE ARTHROPLASTY Left 04/23/2016   Procedure: LEFT TOTAL KNEE ARTHROPLASTY;  Surgeon: Dempsey Sensor, MD;  Location: [facility] OR;  Service: Orthopedics;  Laterality: Left;   TUBAL LIGATION      FAMILY HISTORY: Family History  Problem Relation Age of Onset   Breast cancer Daughter 63   Healthy Mother    Dementia Mother    Healthy Father     SOCIAL HISTORY: Social History   Socioeconomic History   Marital status: Single    Spouse name: Not on file   Number of children: 8   Years of education: Not on file   Highest education level: Not on file  Occupational History   Not on file  Tobacco Use   Smoking status: Never   Smokeless  tobacco: Never  Substance and Sexual Activity   Alcohol use: No    Comment: social hx    Drug use: No   Sexual activity: Not on file  Other Topics Concern   Not on file  Social History Narrative   Lives with daughter   Social Drivers of Health   Financial Resource Strain: Not on file  Food Insecurity: Not on file  Transportation Needs: Not on file  Physical Activity: Not on file  Stress: Not on file  Social Connections: Unknown (12/02/2021)   [facility]   Social Network    Social Network: Not on file  Intimate Partner Violence: Unknown (10/24/2021)   Received from [facility]   HITS    Physically Hurt: Not on file    Insult or Talk Down To: Not on file    Threaten Physical Harm: Not on file    Scream or Curse: Not on file      PHYSICAL EXAM  Vitals:   02/29/24 1019  BP: (!) 82/41  Pulse: 77  Weight: 139 lb 3.2 oz (63.1 kg)    Body mass index is 22.47 kg/m.    02/16/2023    3:36 PM 09/26/2021    9:09 AM  MMSE - Mini Mental State Exam  Orientation to time 0 0  Orientation to Place 0 0  Registration 2 3  Attention/ Calculation 0 1  Recall 0 0  Language- name 2 objects 2 0  Language- repeat 1 1  Language- follow 3 step command 2 2  Language- read & follow direction 1 1  Write a sentence 0 0  Copy design 0 0  Total score 8 8    Generalized: Well developed, in no acute distress   Neurological examination  Mentation: Alert.  Follows all commands intermittently Cranial nerve II-XII: Extraocular movements were full, visual field were full on confrontational test.  Facial sensation and strength were normal. Head turning and shoulder shrug  were normal and symmetric. Motor: Good strength Sensory: Unable to test Coordination: Unable to test Gait and station: In a wheelchair   DIAGNOSTIC DATA (LABS, IMAGING, TESTING) - I reviewed patient records, labs, notes, testing and imaging myself where available.  Lab Results  Component Value Date    WBC 5.9 07/12/2022   HGB 13.1 07/12/2022   HCT 41.2 07/12/2022   MCV 97.9 07/12/2022   PLT 183 07/12/2022      Component Value Date/Time   NA 141 07/12/2022 1410   NA 142 06/27/2021 0905   K 5.5 (H) 07/12/2022 1410   CL 111 07/12/2022 1410   CO2 26 07/12/2022 1410   GLUCOSE 117 (H) 07/12/2022 1410   BUN 32 (H) 07/12/2022 1410   BUN 38 (H) 06/27/2021 0905   CREATININE 1.49 (H) 07/12/2022 1410   CALCIUM  9.6 07/12/2022 1410   PROT 6.4 (L) 07/12/2022 1410   PROT 7.4 06/27/2021 0905   ALBUMIN 3.4 (L) 07/12/2022 1410   ALBUMIN 4.6 06/27/2021 0905   AST 19 07/12/2022 1410   ALT 6 07/12/2022 1410   ALKPHOS 81 07/12/2022 1410   BILITOT 0.3 07/12/2022 1410   BILITOT 0.3 06/27/2021 0905   GFRNONAA 34 (L) 07/12/2022 1410   GFRAA 54 (L) 09/18/2019 0510    Lab Results  Component Value Date   HGBA1C 6.2 (H) 04/15/2016   Lab Results  Component Value Date   VITAMINB12 789 06/27/2021   Lab Results  Component Value Date   TSH 2.090 06/27/2021      ASSESSMENT AND PLAN 89 y.o. year old female  has a past medical history of Arthritis, Cataract, Diabetes mellitus without complication (HCC), Family history of anesthesia complication, Headache(784.0), History of stomach ulcers, Hypertension, Kidney problem, Pneumonia, Seasonal allergies, Sleep apnea, Stroke (HCC), and Urinary tract infection. here with:  Primary Parkinsonism  Continue Sinemet  25-100 mg TID Continue to monitor symptoms  2. Memory disturbance  Previous MMSE 8 out of 30 did not test today Daughter was asking for medication to help her with combativeness in the morning.  I did advise that medication may cause side effects such as increased drowsiness.  Daughter feels that it is manageable for now.  Certainly if it gets worse we will reconsider.  3. OSA on CPAP  CPAP compliance is suboptimal  Dr. Buck recently changed pressure 5-9 but this has not been done.  Will send another order.   FU in 6 months or sooner if  neeed.    Duwaine Russell, MSN, NP-C 02/29/2024, 10:35 AM [facility] [address] [place], [place] [address] [phone]  The patient's condition requires frequent monitoring and adjustments in the treatment plan, reflecting the ongoing complexity of care.  This provider is the continuing focal point for all needed services for this condition.

## 2024-02-29 ENCOUNTER — Ambulatory Visit (INDEPENDENT_AMBULATORY_CARE_PROVIDER_SITE_OTHER): Payer: Medicare Other | Admitting: Adult Health

## 2024-02-29 VITALS — BP 108/80 | HR 77 | Wt 139.2 lb

## 2024-02-29 DIAGNOSIS — G4733 Obstructive sleep apnea (adult) (pediatric): Secondary | ICD-10-CM

## 2024-02-29 DIAGNOSIS — G20C Parkinsonism, unspecified: Secondary | ICD-10-CM | POA: Diagnosis not present

## 2024-02-29 DIAGNOSIS — R413 Other amnesia: Secondary | ICD-10-CM | POA: Diagnosis not present

## 2024-02-29 NOTE — Progress Notes (Signed)
 I reviewed the above note and documentation by the Nurse Practitioner and agree with the history, exam, assessment and plan as outlined above. I was available for consultation. Debbra Fairy, MD, PhD [facility])

## 2024-03-01 ENCOUNTER — Ambulatory Visit: Admitting: Physician Assistant

## 2024-03-02 ENCOUNTER — Encounter: Attending: Physician Assistant | Admitting: Physician Assistant

## 2024-03-02 DIAGNOSIS — G308 Other Alzheimer's disease: Secondary | ICD-10-CM | POA: Insufficient documentation

## 2024-03-02 DIAGNOSIS — I10 Essential (primary) hypertension: Secondary | ICD-10-CM | POA: Insufficient documentation

## 2024-03-02 DIAGNOSIS — E11621 Type 2 diabetes mellitus with foot ulcer: Secondary | ICD-10-CM | POA: Insufficient documentation

## 2024-03-02 DIAGNOSIS — L97522 Non-pressure chronic ulcer of other part of left foot with fat layer exposed: Secondary | ICD-10-CM | POA: Diagnosis not present

## 2024-03-09 ENCOUNTER — Other Ambulatory Visit: Payer: Self-pay | Admitting: Podiatry

## 2024-03-23 ENCOUNTER — Encounter: Attending: Physician Assistant | Admitting: Physician Assistant

## 2024-03-23 DIAGNOSIS — I10 Essential (primary) hypertension: Secondary | ICD-10-CM | POA: Diagnosis not present

## 2024-03-23 DIAGNOSIS — E11621 Type 2 diabetes mellitus with foot ulcer: Secondary | ICD-10-CM | POA: Diagnosis present

## 2024-03-23 DIAGNOSIS — G308 Other Alzheimer's disease: Secondary | ICD-10-CM | POA: Insufficient documentation

## 2024-03-23 DIAGNOSIS — L97522 Non-pressure chronic ulcer of other part of left foot with fat layer exposed: Secondary | ICD-10-CM | POA: Diagnosis not present

## 2024-03-23 DIAGNOSIS — L97412 Non-pressure chronic ulcer of right heel and midfoot with fat layer exposed: Secondary | ICD-10-CM | POA: Insufficient documentation

## 2024-04-06 ENCOUNTER — Ambulatory Visit: Admitting: Physician Assistant

## 2024-04-14 ENCOUNTER — Encounter: Admitting: Physician Assistant

## 2024-04-14 DIAGNOSIS — E11621 Type 2 diabetes mellitus with foot ulcer: Secondary | ICD-10-CM | POA: Diagnosis not present

## 2024-04-21 ENCOUNTER — Encounter: Attending: Physician Assistant | Admitting: Physician Assistant

## 2024-04-21 DIAGNOSIS — G308 Other Alzheimer's disease: Secondary | ICD-10-CM | POA: Insufficient documentation

## 2024-04-21 DIAGNOSIS — E11621 Type 2 diabetes mellitus with foot ulcer: Secondary | ICD-10-CM | POA: Insufficient documentation

## 2024-04-21 DIAGNOSIS — I10 Essential (primary) hypertension: Secondary | ICD-10-CM | POA: Insufficient documentation

## 2024-04-21 DIAGNOSIS — L97522 Non-pressure chronic ulcer of other part of left foot with fat layer exposed: Secondary | ICD-10-CM | POA: Diagnosis not present

## 2024-04-21 DIAGNOSIS — L97412 Non-pressure chronic ulcer of right heel and midfoot with fat layer exposed: Secondary | ICD-10-CM | POA: Diagnosis not present

## 2024-05-04 ENCOUNTER — Ambulatory Visit: Admitting: Podiatry

## 2024-05-12 ENCOUNTER — Encounter: Admitting: Physician Assistant

## 2024-05-12 DIAGNOSIS — E11621 Type 2 diabetes mellitus with foot ulcer: Secondary | ICD-10-CM | POA: Diagnosis not present

## 2024-06-02 ENCOUNTER — Encounter: Attending: Physician Assistant | Admitting: Physician Assistant

## 2024-06-23 ENCOUNTER — Encounter: Admitting: Physician Assistant

## 2024-07-01 ENCOUNTER — Encounter: Attending: Internal Medicine | Admitting: Internal Medicine

## 2024-07-01 DIAGNOSIS — L97412 Non-pressure chronic ulcer of right heel and midfoot with fat layer exposed: Secondary | ICD-10-CM | POA: Insufficient documentation

## 2024-07-01 DIAGNOSIS — I10 Essential (primary) hypertension: Secondary | ICD-10-CM | POA: Insufficient documentation

## 2024-07-01 DIAGNOSIS — E11621 Type 2 diabetes mellitus with foot ulcer: Secondary | ICD-10-CM | POA: Insufficient documentation

## 2024-07-01 DIAGNOSIS — G308 Other Alzheimer's disease: Secondary | ICD-10-CM | POA: Insufficient documentation

## 2024-07-01 DIAGNOSIS — L97522 Non-pressure chronic ulcer of other part of left foot with fat layer exposed: Secondary | ICD-10-CM | POA: Diagnosis not present

## 2024-07-19 ENCOUNTER — Encounter: Admitting: Physician Assistant

## 2024-07-29 ENCOUNTER — Other Ambulatory Visit: Payer: Self-pay

## 2024-08-05 ENCOUNTER — Encounter: Attending: Physician Assistant | Admitting: Physician Assistant

## 2024-08-05 DIAGNOSIS — G308 Other Alzheimer's disease: Secondary | ICD-10-CM | POA: Insufficient documentation

## 2024-08-05 DIAGNOSIS — L97412 Non-pressure chronic ulcer of right heel and midfoot with fat layer exposed: Secondary | ICD-10-CM | POA: Insufficient documentation

## 2024-08-05 DIAGNOSIS — E11621 Type 2 diabetes mellitus with foot ulcer: Secondary | ICD-10-CM | POA: Insufficient documentation

## 2024-08-05 DIAGNOSIS — L97522 Non-pressure chronic ulcer of other part of left foot with fat layer exposed: Secondary | ICD-10-CM | POA: Insufficient documentation

## 2024-08-05 DIAGNOSIS — I10 Essential (primary) hypertension: Secondary | ICD-10-CM | POA: Insufficient documentation

## 2024-09-05 ENCOUNTER — Ambulatory Visit: Admitting: Neurology
